# Patient Record
Sex: Female | Born: 1950 | Race: White | Hispanic: No | Marital: Single
Health system: Midwestern US, Academic
[De-identification: ages and names within clinical notes are randomized; demographics above are authoritative.]

---

## 2017-04-14 MED ORDER — IOPAMIDOL 76 % IV SOLN
100 mL | Freq: Once | INTRAVENOUS | 0 refills | Status: CP
Start: 2017-04-14 — End: ?

## 2017-04-14 MED ORDER — SODIUM CHLORIDE 0.9 % IJ SOLN
50 mL | Freq: Once | INTRAVENOUS | 0 refills | Status: CP
Start: 2017-04-14 — End: ?

## 2018-03-05 ENCOUNTER — Encounter: Admit: 2018-03-05 | Discharge: 2018-03-05 | Payer: MEDICARE

## 2018-03-05 DIAGNOSIS — K219 Gastro-esophageal reflux disease without esophagitis: ICD-10-CM

## 2018-03-05 DIAGNOSIS — K289 Gastrojejunal ulcer, unspecified as acute or chronic, without hemorrhage or perforation: ICD-10-CM

## 2018-03-05 DIAGNOSIS — E039 Hypothyroidism, unspecified: ICD-10-CM

## 2018-03-05 DIAGNOSIS — J309 Allergic rhinitis, unspecified: Principal | ICD-10-CM

## 2018-03-05 DIAGNOSIS — I10 Essential (primary) hypertension: ICD-10-CM

## 2018-03-05 DIAGNOSIS — J45909 Unspecified asthma, uncomplicated: ICD-10-CM

## 2018-03-05 DIAGNOSIS — E785 Hyperlipidemia, unspecified: ICD-10-CM

## 2018-03-05 DIAGNOSIS — H269 Unspecified cataract: ICD-10-CM

## 2018-03-05 DIAGNOSIS — N186 End stage renal disease: ICD-10-CM

## 2018-03-05 DIAGNOSIS — J449 Chronic obstructive pulmonary disease, unspecified: ICD-10-CM

## 2018-03-05 DIAGNOSIS — R7303 Prediabetes: ICD-10-CM

## 2018-04-13 ENCOUNTER — Encounter: Admit: 2018-04-13 | Discharge: 2018-04-13 | Payer: MEDICARE

## 2018-04-13 ENCOUNTER — Ambulatory Visit: Admit: 2018-04-13 | Discharge: 2018-04-14 | Payer: MEDICARE

## 2018-04-13 ENCOUNTER — Ambulatory Visit: Admit: 2018-04-13 | Discharge: 2018-04-13 | Payer: MEDICARE

## 2018-04-13 DIAGNOSIS — M797 Fibromyalgia: ICD-10-CM

## 2018-04-13 DIAGNOSIS — C641 Malignant neoplasm of right kidney, except renal pelvis: Principal | ICD-10-CM

## 2018-04-13 DIAGNOSIS — J45909 Unspecified asthma, uncomplicated: ICD-10-CM

## 2018-04-13 DIAGNOSIS — K289 Gastrojejunal ulcer, unspecified as acute or chronic, without hemorrhage or perforation: ICD-10-CM

## 2018-04-13 DIAGNOSIS — J449 Chronic obstructive pulmonary disease, unspecified: ICD-10-CM

## 2018-04-13 DIAGNOSIS — I10 Essential (primary) hypertension: ICD-10-CM

## 2018-04-13 DIAGNOSIS — R350 Frequency of micturition: ICD-10-CM

## 2018-04-13 DIAGNOSIS — R7303 Prediabetes: ICD-10-CM

## 2018-04-13 DIAGNOSIS — E039 Hypothyroidism, unspecified: ICD-10-CM

## 2018-04-13 DIAGNOSIS — H269 Unspecified cataract: ICD-10-CM

## 2018-04-13 DIAGNOSIS — N186 End stage renal disease: ICD-10-CM

## 2018-04-13 DIAGNOSIS — E785 Hyperlipidemia, unspecified: ICD-10-CM

## 2018-04-13 DIAGNOSIS — J309 Allergic rhinitis, unspecified: Principal | ICD-10-CM

## 2018-04-13 DIAGNOSIS — K219 Gastro-esophageal reflux disease without esophagitis: ICD-10-CM

## 2018-04-13 LAB — BASIC METABOLIC PANEL
Lab: 108 MMOL/L (ref 98–110)
Lab: 142 MMOL/L (ref 137–147)
Lab: 3.7 MMOL/L (ref 3.5–5.1)

## 2018-04-13 LAB — POC CREATININE, RAD: Lab: 0.8 mg/dL (ref 0.4–1.00)

## 2018-04-13 MED ORDER — SODIUM CHLORIDE 0.9 % IJ SOLN
50 mL | Freq: Once | INTRAVENOUS | 0 refills | Status: CP
Start: 2018-04-13 — End: ?
  Administered 2018-04-13: 15:00:00 50 mL via INTRAVENOUS

## 2018-04-13 MED ORDER — IOHEXOL 350 MG IODINE/ML IV SOLN
100 mL | Freq: Once | INTRAVENOUS | 0 refills | Status: CP
Start: 2018-04-13 — End: ?
  Administered 2018-04-13: 15:00:00 100 mL via INTRAVENOUS

## 2018-04-13 MED ORDER — OXYBUTYNIN CHLORIDE 10 MG PO TR24
10 mg | ORAL_TABLET | Freq: Every day | ORAL | 3 refills | 12.00000 days | Status: AC
Start: 2018-04-13 — End: 2020-04-25

## 2018-04-15 ENCOUNTER — Encounter: Admit: 2018-04-15 | Discharge: 2018-04-15 | Payer: MEDICARE

## 2018-04-17 ENCOUNTER — Encounter: Admit: 2018-04-17 | Discharge: 2018-04-17 | Payer: MEDICARE

## 2018-04-17 DIAGNOSIS — K869 Disease of pancreas, unspecified: Principal | ICD-10-CM

## 2018-04-17 MED ORDER — SODIUM CHLORIDE 0.9 % IV SOLP
INTRAVENOUS | 0 refills | Status: CN
Start: 2018-04-17 — End: ?

## 2018-04-20 ENCOUNTER — Ambulatory Visit: Admit: 2018-04-20 | Discharge: 2018-04-20 | Payer: MEDICARE

## 2018-04-20 DIAGNOSIS — K869 Disease of pancreas, unspecified: Principal | ICD-10-CM

## 2018-05-28 DIAGNOSIS — K8689 Other specified diseases of pancreas: Principal | ICD-10-CM

## 2018-06-01 ENCOUNTER — Encounter: Admit: 2018-06-01 | Discharge: 2018-06-01 | Payer: MEDICARE

## 2018-09-04 ENCOUNTER — Encounter: Admit: 2018-09-04 | Discharge: 2018-09-05 | Payer: MEDICARE

## 2018-09-17 ENCOUNTER — Encounter: Admit: 2018-09-17 | Discharge: 2018-09-17 | Payer: MEDICARE

## 2018-09-18 ENCOUNTER — Encounter: Admit: 2018-09-18 | Discharge: 2018-09-18 | Payer: MEDICARE

## 2018-09-25 ENCOUNTER — Encounter: Admit: 2018-09-25 | Discharge: 2018-09-25 | Payer: MEDICARE

## 2018-09-25 ENCOUNTER — Ambulatory Visit: Admit: 2018-09-25 | Discharge: 2018-09-25 | Payer: MEDICARE

## 2018-09-25 DIAGNOSIS — E039 Hypothyroidism, unspecified: ICD-10-CM

## 2018-09-25 DIAGNOSIS — Z888 Allergy status to other drugs, medicaments and biological substances status: ICD-10-CM

## 2018-09-25 DIAGNOSIS — Z905 Acquired absence of kidney: ICD-10-CM

## 2018-09-25 DIAGNOSIS — N186 End stage renal disease: ICD-10-CM

## 2018-09-25 DIAGNOSIS — Z882 Allergy status to sulfonamides status: ICD-10-CM

## 2018-09-25 DIAGNOSIS — Z85528 Personal history of other malignant neoplasm of kidney: ICD-10-CM

## 2018-09-25 DIAGNOSIS — H269 Unspecified cataract: ICD-10-CM

## 2018-09-25 DIAGNOSIS — I120 Hypertensive chronic kidney disease with stage 5 chronic kidney disease or end stage renal disease: ICD-10-CM

## 2018-09-25 DIAGNOSIS — M797 Fibromyalgia: ICD-10-CM

## 2018-09-25 DIAGNOSIS — Z87891 Personal history of nicotine dependence: ICD-10-CM

## 2018-09-25 DIAGNOSIS — K219 Gastro-esophageal reflux disease without esophagitis: ICD-10-CM

## 2018-09-25 DIAGNOSIS — Z880 Allergy status to penicillin: ICD-10-CM

## 2018-09-25 DIAGNOSIS — E785 Hyperlipidemia, unspecified: ICD-10-CM

## 2018-09-25 DIAGNOSIS — Z885 Allergy status to narcotic agent status: ICD-10-CM

## 2018-09-25 DIAGNOSIS — R7303 Prediabetes: ICD-10-CM

## 2018-09-25 DIAGNOSIS — J449 Chronic obstructive pulmonary disease, unspecified: ICD-10-CM

## 2018-09-25 DIAGNOSIS — J45909 Unspecified asthma, uncomplicated: ICD-10-CM

## 2018-09-25 DIAGNOSIS — K289 Gastrojejunal ulcer, unspecified as acute or chronic, without hemorrhage or perforation: ICD-10-CM

## 2018-09-25 DIAGNOSIS — J309 Allergic rhinitis, unspecified: Principal | ICD-10-CM

## 2018-09-25 DIAGNOSIS — K8689 Other specified diseases of pancreas: Principal | ICD-10-CM

## 2018-09-25 DIAGNOSIS — I10 Essential (primary) hypertension: ICD-10-CM

## 2018-09-25 MED ORDER — LIDOCAINE (PF) 200 MG/10 ML (2 %) IJ SYRG
0 refills | Status: DC
Start: 2018-09-25 — End: 2018-09-25
  Administered 2018-09-25: 16:00:00 120 mg via INTRAVENOUS

## 2018-09-25 MED ORDER — FENTANYL CITRATE (PF) 50 MCG/ML IJ SOLN
50 ug | INTRAVENOUS | 0 refills | Status: DC | PRN
Start: 2018-09-25 — End: 2018-09-25

## 2018-09-25 MED ORDER — PROPOFOL 10 MG/ML IV EMUL 20 ML (INFUSION)(AM)(OR)
INTRAVENOUS | 0 refills | Status: DC
Start: 2018-09-25 — End: 2018-09-25
  Administered 2018-09-25: 16:00:00 150 ug/kg/min via INTRAVENOUS

## 2018-09-25 MED ORDER — HALOPERIDOL LACTATE 5 MG/ML IJ SOLN
1 mg | Freq: Once | INTRAVENOUS | 0 refills | Status: CP | PRN
Start: 2018-09-25 — End: ?
  Administered 2018-09-25: 17:00:00 1 mg via INTRAVENOUS

## 2018-09-25 MED ORDER — PROPOFOL INJ 10 MG/ML IV VIAL
0 refills | Status: DC
Start: 2018-09-25 — End: 2018-09-25
  Administered 2018-09-25: 16:00:00 30 mg via INTRAVENOUS
  Administered 2018-09-25: 16:00:00 10 mg via INTRAVENOUS
  Administered 2018-09-25 (×2): 20 mg via INTRAVENOUS
  Administered 2018-09-25: 16:00:00 40 mg via INTRAVENOUS

## 2018-09-25 MED ORDER — SODIUM CHLORIDE 0.9 % IV SOLP
1000 mL | INTRAVENOUS | 0 refills | Status: DC
Start: 2018-09-25 — End: 2018-09-25
  Administered 2018-09-25: 15:00:00 1000 mL via INTRAVENOUS

## 2018-09-28 ENCOUNTER — Encounter: Admit: 2018-09-28 | Discharge: 2018-09-28 | Payer: MEDICARE

## 2018-09-28 DIAGNOSIS — I10 Essential (primary) hypertension: ICD-10-CM

## 2018-09-28 DIAGNOSIS — E785 Hyperlipidemia, unspecified: ICD-10-CM

## 2018-09-28 DIAGNOSIS — M797 Fibromyalgia: ICD-10-CM

## 2018-09-28 DIAGNOSIS — J309 Allergic rhinitis, unspecified: Principal | ICD-10-CM

## 2018-09-28 DIAGNOSIS — N186 End stage renal disease: ICD-10-CM

## 2018-09-28 DIAGNOSIS — K219 Gastro-esophageal reflux disease without esophagitis: ICD-10-CM

## 2018-09-28 DIAGNOSIS — R7303 Prediabetes: ICD-10-CM

## 2018-09-28 DIAGNOSIS — H269 Unspecified cataract: ICD-10-CM

## 2018-09-28 DIAGNOSIS — K289 Gastrojejunal ulcer, unspecified as acute or chronic, without hemorrhage or perforation: ICD-10-CM

## 2018-09-28 DIAGNOSIS — J45909 Unspecified asthma, uncomplicated: ICD-10-CM

## 2018-09-28 DIAGNOSIS — J449 Chronic obstructive pulmonary disease, unspecified: ICD-10-CM

## 2018-09-28 DIAGNOSIS — E039 Hypothyroidism, unspecified: ICD-10-CM

## 2018-10-02 ENCOUNTER — Encounter: Admit: 2018-10-02 | Discharge: 2018-10-02 | Payer: MEDICARE

## 2018-10-02 ENCOUNTER — Ambulatory Visit: Admit: 2018-10-02 | Discharge: 2018-10-02 | Payer: MEDICARE

## 2018-10-02 DIAGNOSIS — K869 Disease of pancreas, unspecified: Principal | ICD-10-CM

## 2018-10-02 MED ORDER — SODIUM CHLORIDE 0.9 % IV SOLP
INTRAVENOUS | 0 refills | Status: CN
Start: 2018-10-02 — End: ?

## 2018-10-05 ENCOUNTER — Encounter: Admit: 2018-10-05 | Discharge: 2018-10-05 | Payer: MEDICARE

## 2018-10-06 ENCOUNTER — Encounter: Admit: 2018-10-06 | Discharge: 2018-10-06 | Payer: MEDICARE

## 2018-10-13 ENCOUNTER — Encounter: Admit: 2018-10-13 | Discharge: 2018-10-13 | Payer: MEDICARE

## 2018-10-15 ENCOUNTER — Encounter: Admit: 2018-10-15 | Discharge: 2018-10-16 | Payer: MEDICARE

## 2018-11-07 ENCOUNTER — Encounter: Admit: 2018-11-07 | Discharge: 2018-11-07 | Payer: MEDICARE

## 2018-11-10 ENCOUNTER — Encounter: Admit: 2018-11-10 | Discharge: 2018-11-10 | Payer: MEDICARE

## 2018-11-10 DIAGNOSIS — K869 Disease of pancreas, unspecified: Principal | ICD-10-CM

## 2019-05-17 ENCOUNTER — Encounter: Admit: 2019-05-17 | Discharge: 2019-05-17

## 2019-07-05 IMAGING — US VDOPLERT
1 series · 14 of 16 positions shown · non-contrast
Comparison: none

Procedure(s): US venous dop LE [id number]

RIGHT LOWER EXTREMITY VENOUS DOPPLER DATED 07/05/2019:
INDICATION: Lower extremity pain and swelling.

[Series 1: us venous dop le (id) · portal-venous · 14 of 19 slices shown]
[im 1/19]
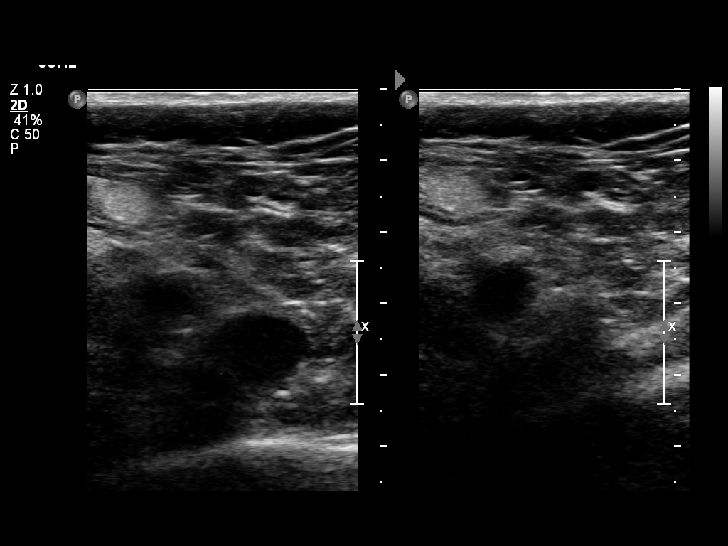
[im 2/19]
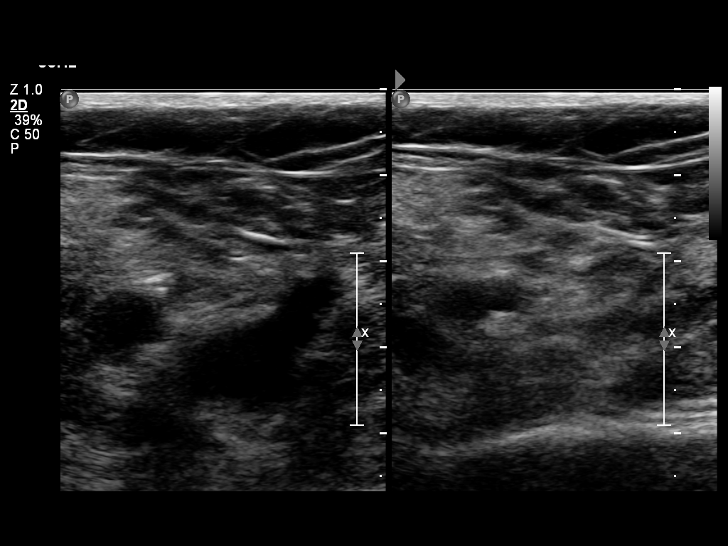
[im 3/19]
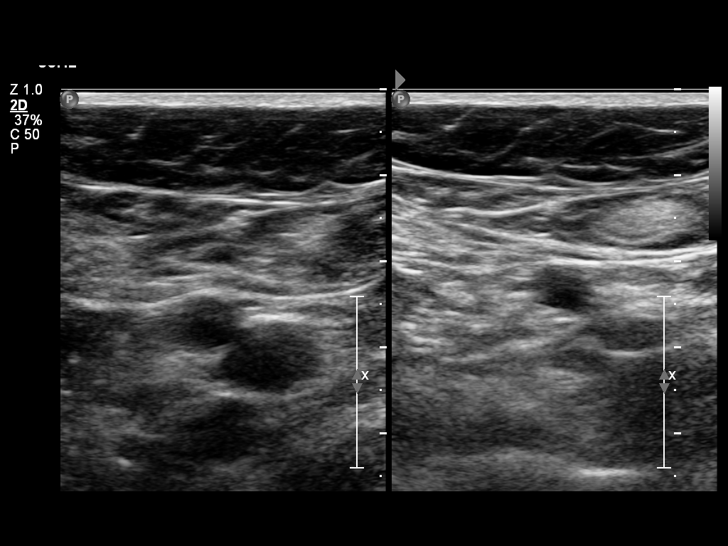
[im 5/19]
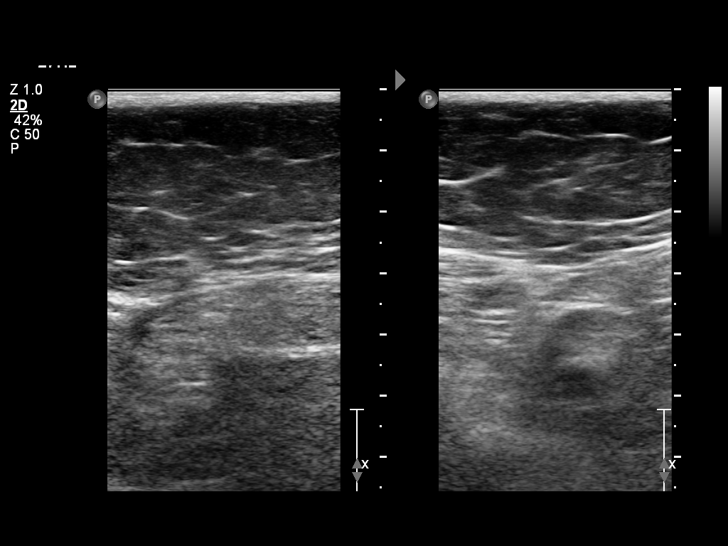
[im 7/19]
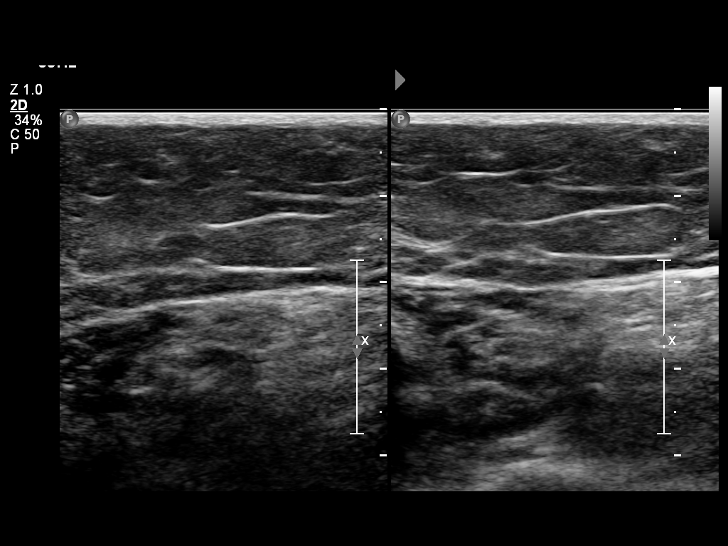
[im 8/19]
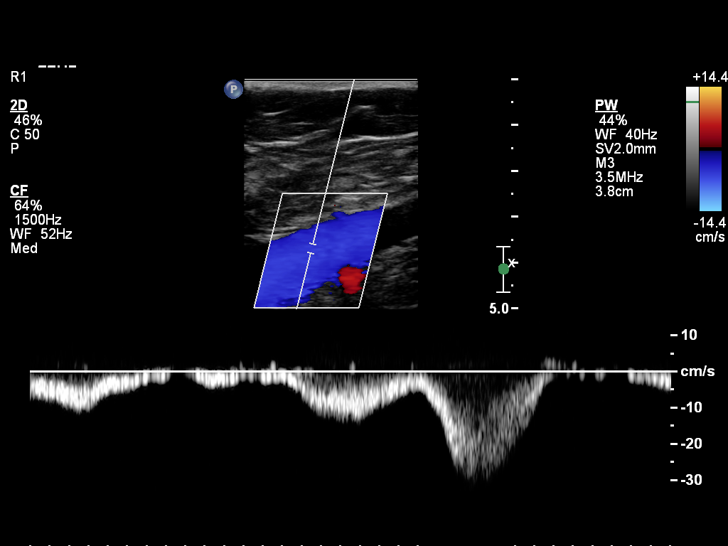
[im 9/19]
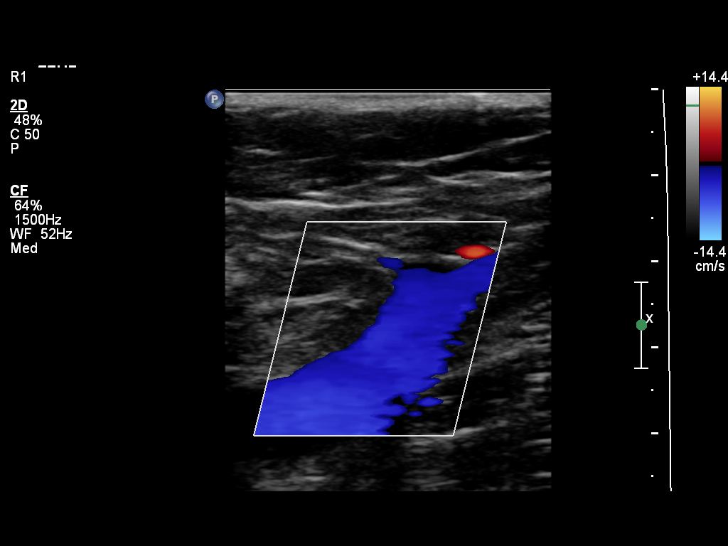
[im 10/19]
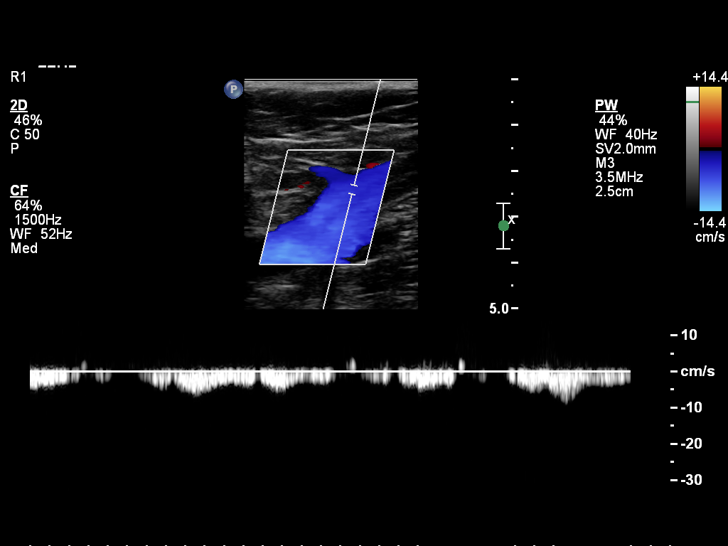
[im 11/19]
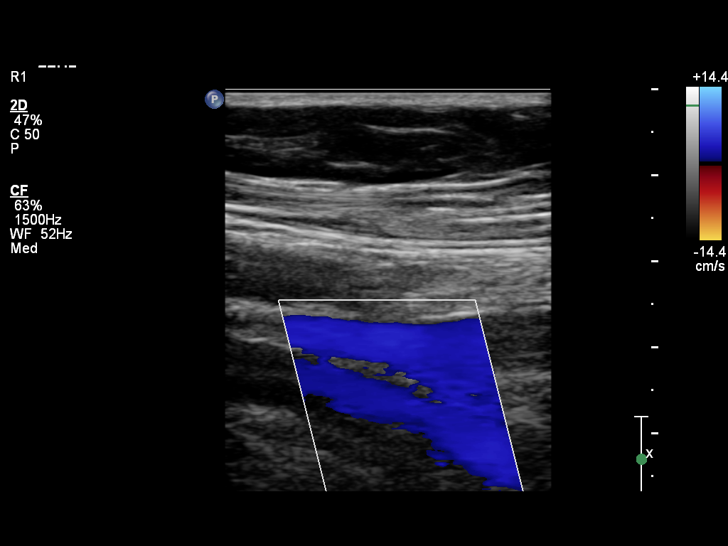
[im 13/19]
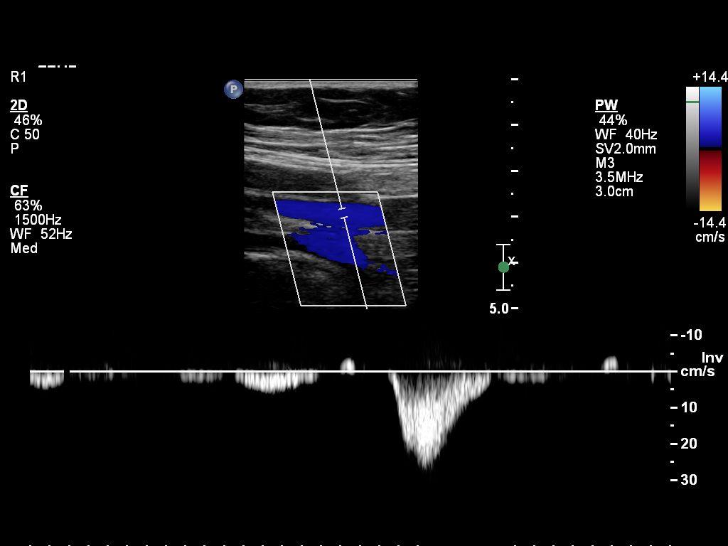
[im 15/19]
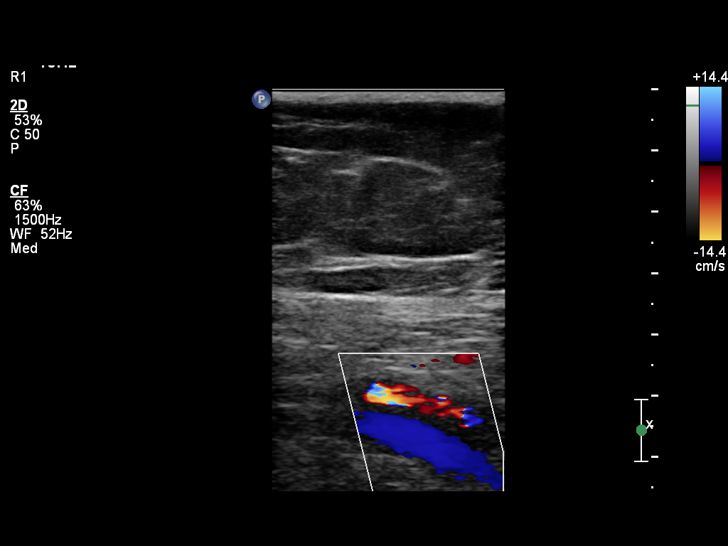
[im 16/19]
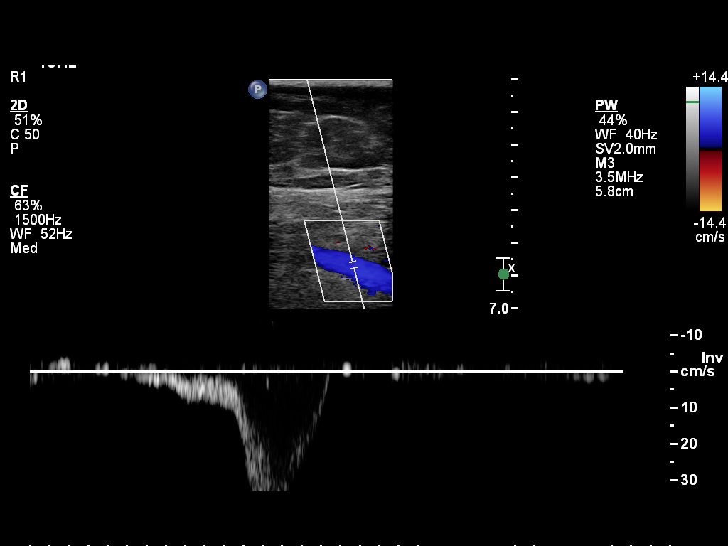
[im 17/19]
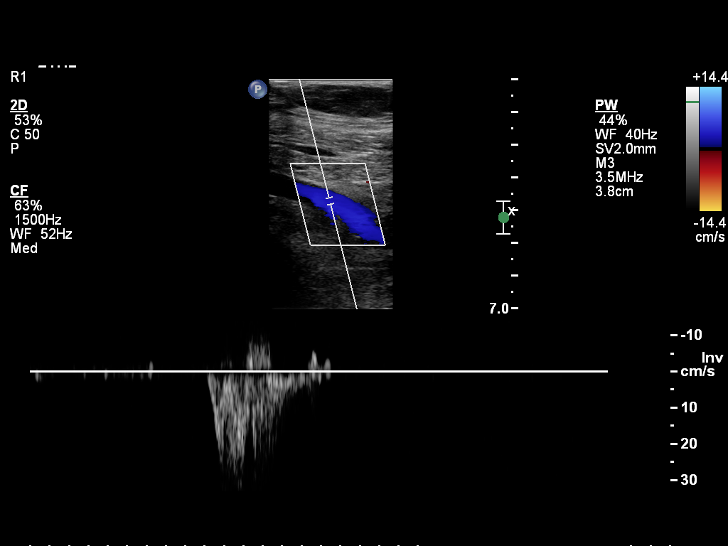
[im 19/19]
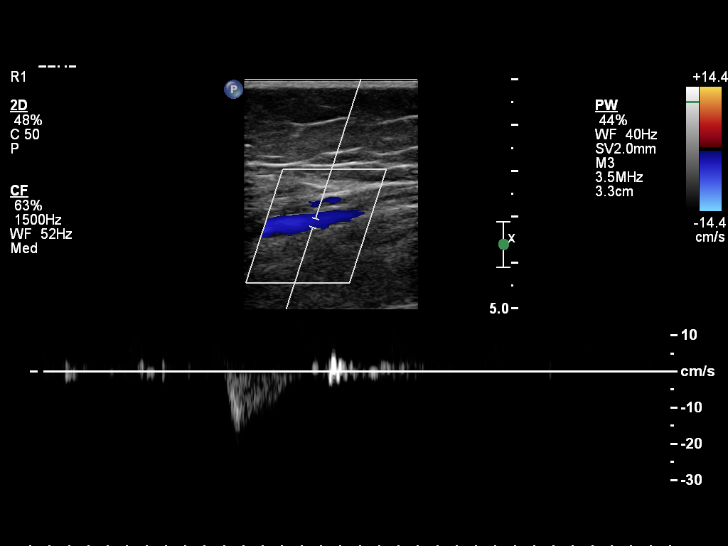

[14 of 16 positions shown; findings below may reference images not displayed]

COMPARISON STUDIES: None

TECHNIQUE/FINDINGS:

Grayscale, pulse wave Doppler and color flow Doppler imaging
was obtained of the venous system of the right lower
extremity. No evidence of deep venous thrombosis is seen
from the level of the common femoral vein down to the calf
veins. The greater saphenous vein is patent.
IMPRESSION: No evidence of lower extremity deep venous thrombosis from
the right common femoral vein to the calf veins.

## 2019-08-23 ENCOUNTER — Encounter: Admit: 2019-08-23 | Discharge: 2019-08-23 | Payer: MEDICARE

## 2019-08-23 ENCOUNTER — Ambulatory Visit: Admit: 2019-08-23 | Discharge: 2019-08-23 | Payer: MEDICARE

## 2019-08-23 ENCOUNTER — Ambulatory Visit: Admit: 2019-08-23 | Discharge: 2019-08-24 | Payer: MEDICARE

## 2019-08-23 LAB — BASIC METABOLIC PANEL
Lab: 0.9 mg/dL (ref 0.4–1.00)
Lab: 101 mg/dL — ABNORMAL HIGH (ref 70–100)
Lab: 105 MMOL/L (ref 98–110)
Lab: 13 mg/dL (ref 7–25)
Lab: 141 MMOL/L (ref 137–147)
Lab: 26 MMOL/L (ref 21–30)
Lab: 3.9 MMOL/L (ref 3.5–5.1)
Lab: 9.4 mg/dL (ref 8.5–10.6)
Lab: >60 mL/min (ref >60)
Lab: >60 mL/min (ref >60)
Lab: 10 (ref 3–12)

## 2019-08-23 MED ORDER — IOHEXOL 350 MG IODINE/ML IV SOLN
100 mL | Freq: Once | INTRAVENOUS | 0 refills | Status: CP
Start: 2019-08-23 — End: ?
  Administered 2019-08-23: 17:00:00 100 mL via INTRAVENOUS

## 2019-08-23 MED ORDER — SODIUM CHLORIDE 0.9 % IJ SOLN
50 mL | Freq: Once | INTRAVENOUS | 0 refills | Status: CP
Start: 2019-08-23 — End: ?
  Administered 2019-08-23: 17:00:00 50 mL via INTRAVENOUS

## 2019-09-24 ENCOUNTER — Encounter: Admit: 2019-09-24 | Discharge: 2019-09-24 | Payer: MEDICARE

## 2019-09-24 NOTE — Telephone Encounter
Pt has appt with Dr. Candi Leash on 11/6. d/t "cardiac conditions" she called to see if she needs to reschedule/cancel. She states that cardiology will not clear patient for EUS. Dr. Druscilla Brownie at [facility] in [place] is managing her cardiac care. Call placed to Dr. Druscilla Brownie office. Left message to discuss with Dr. Druscilla Brownie and they will call me back.

## 2019-10-05 ENCOUNTER — Encounter: Admit: 2019-10-05 | Discharge: 2019-10-05 | Payer: MEDICARE

## 2019-10-05 DIAGNOSIS — Z1159 Encounter for screening for other viral diseases: Secondary | ICD-10-CM

## 2019-10-05 DIAGNOSIS — K869 Disease of pancreas, unspecified: Secondary | ICD-10-CM

## 2019-10-05 NOTE — Telephone Encounter
Received cardiac clearance from Dr. Druscilla Brownie stating "as the endoscopy is similar to the transesophageal echocardiogram she should be able to tolerate the procedure."  Patient notified and prep for case placed.  Recommendation from Dr. Druscilla Brownie placed for scanning

## 2019-10-06 ENCOUNTER — Ambulatory Visit: Admit: 2019-10-06 | Discharge: 2019-10-06 | Payer: MEDICARE

## 2019-10-06 DIAGNOSIS — K869 Disease of pancreas, unspecified: Secondary | ICD-10-CM

## 2019-10-17 ENCOUNTER — Encounter: Admit: 2019-10-17 | Discharge: 2019-10-17 | Payer: MEDICARE

## 2019-10-17 DIAGNOSIS — I10 Essential (primary) hypertension: Secondary | ICD-10-CM

## 2019-10-17 DIAGNOSIS — K289 Gastrojejunal ulcer, unspecified as acute or chronic, without hemorrhage or perforation: Secondary | ICD-10-CM

## 2019-10-17 DIAGNOSIS — E785 Hyperlipidemia, unspecified: Secondary | ICD-10-CM

## 2019-10-17 DIAGNOSIS — M797 Fibromyalgia: Secondary | ICD-10-CM

## 2019-10-17 DIAGNOSIS — R7303 Prediabetes: Secondary | ICD-10-CM

## 2019-10-17 DIAGNOSIS — J309 Allergic rhinitis, unspecified: Secondary | ICD-10-CM

## 2019-10-17 DIAGNOSIS — E039 Hypothyroidism, unspecified: Secondary | ICD-10-CM

## 2019-10-17 DIAGNOSIS — K219 Gastro-esophageal reflux disease without esophagitis: Secondary | ICD-10-CM

## 2019-10-17 DIAGNOSIS — H269 Unspecified cataract: Secondary | ICD-10-CM

## 2019-10-17 DIAGNOSIS — J449 Chronic obstructive pulmonary disease, unspecified: Secondary | ICD-10-CM

## 2019-10-17 DIAGNOSIS — N186 End stage renal disease: Secondary | ICD-10-CM

## 2019-10-17 DIAGNOSIS — J45909 Unspecified asthma, uncomplicated: Secondary | ICD-10-CM

## 2019-10-20 IMAGING — DX XR chest 1V portable
1 series · 1 of 1 positions shown · non-contrast
Comparison: none

Procedure(s): XR chest 1V portable

CHEST X-RAY, SINGLE VIEW [DATE]
CLINICAL DATA: Shortness of breath.

[AP]
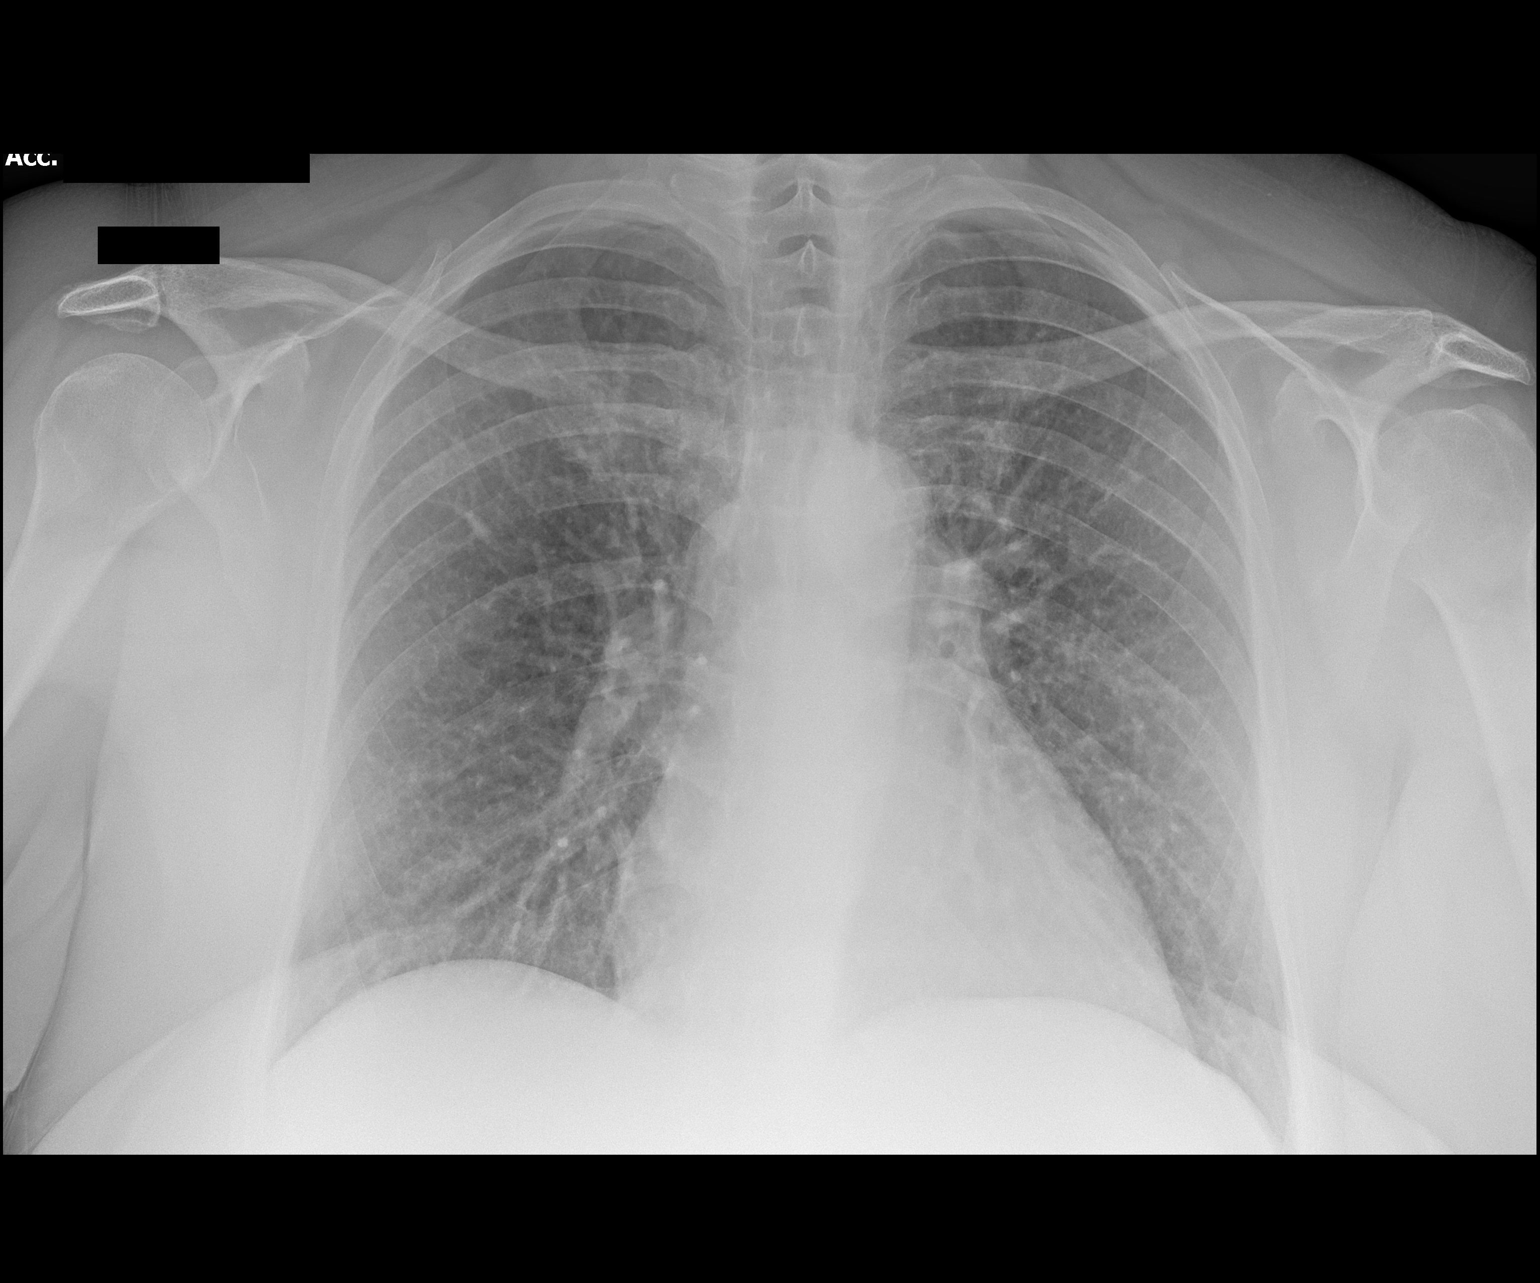

[1 of 1 positions shown; findings below may reference images not displayed]

IMPRESSION: No acute cardiopulmonary process detected. Normal heart
size.

## 2019-10-25 IMAGING — DX XR chest 1V portable
1 series · 1 of 1 positions shown · non-contrast
Comparison: October 20, 2019.

Procedure(s): XR chest 1V portable

CHEST X-RAY, SINGLE VIEW 320
CLINICAL DATA: Shortness of breath.

[AP]
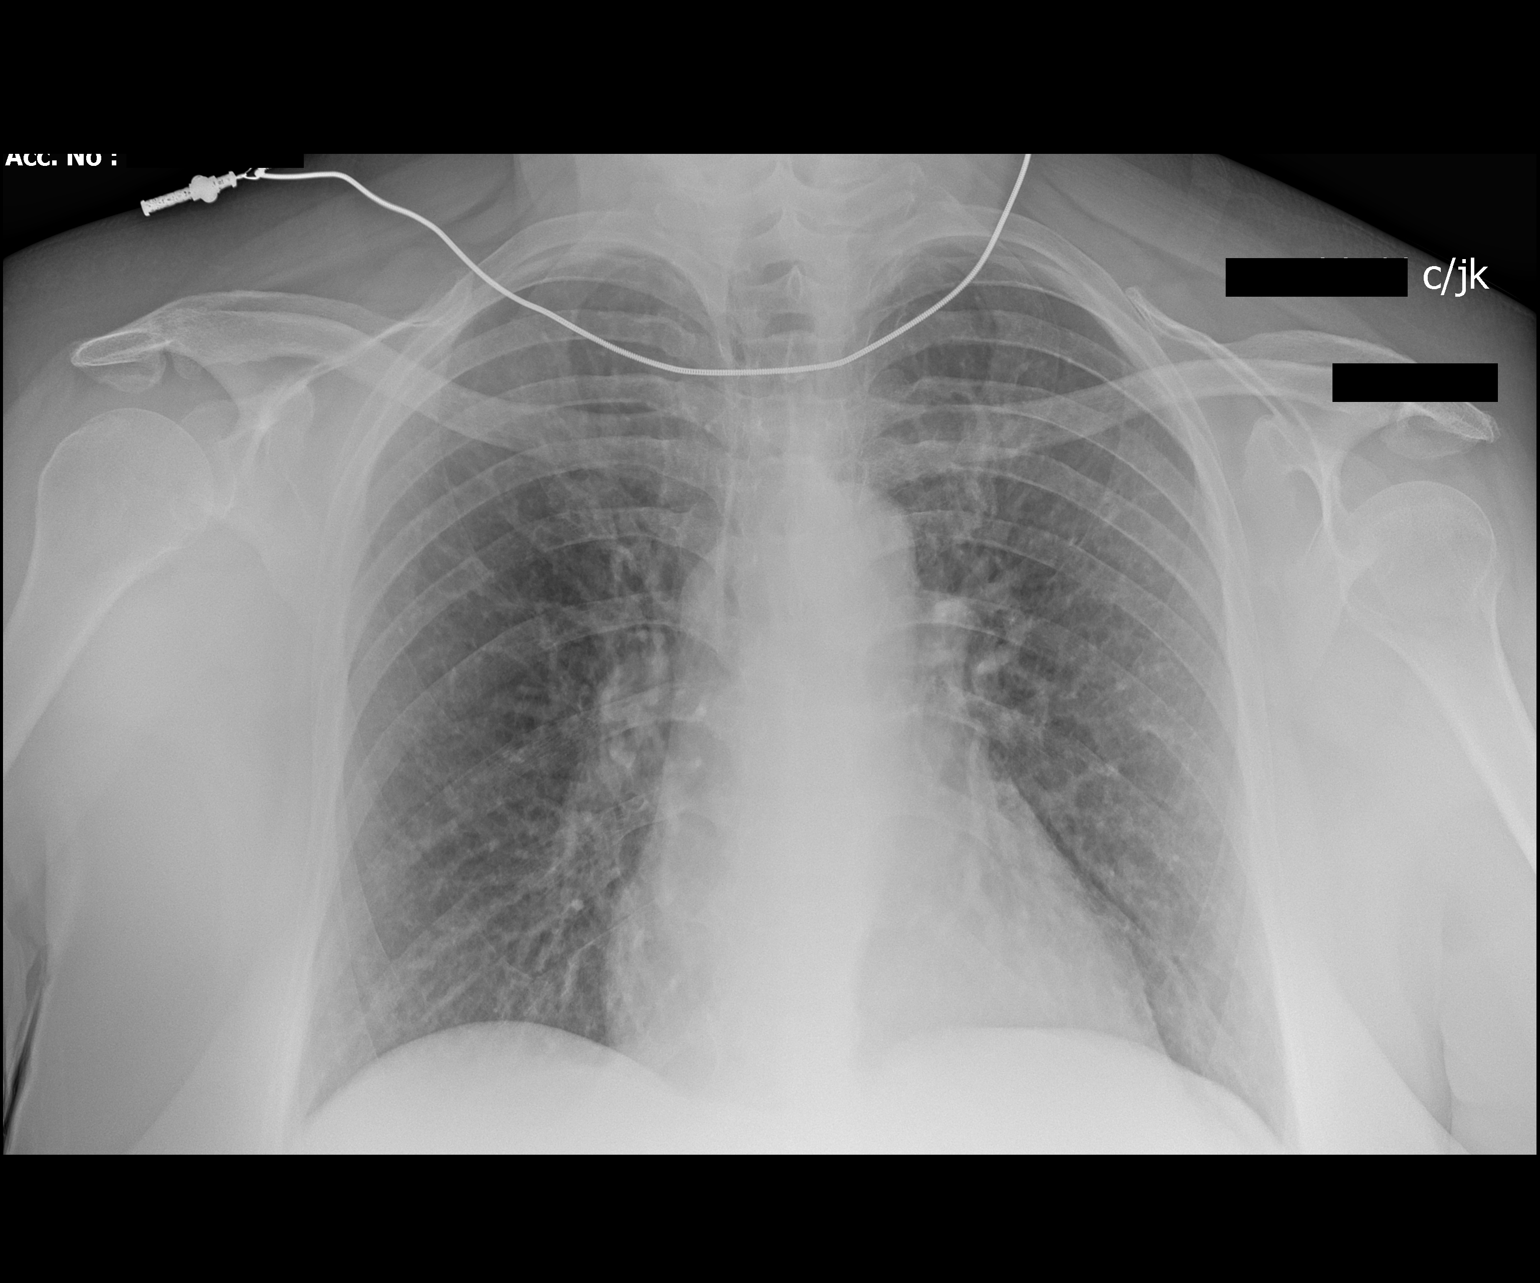

[1 of 1 positions shown; findings below may reference images not displayed]

IMPRESSION: No acute cardiopulmonary process detected. Normal heart
size.

## 2019-12-06 ENCOUNTER — Encounter: Admit: 2019-12-06 | Discharge: 2019-12-06 | Payer: MEDICARE

## 2019-12-06 DIAGNOSIS — Z1159 Encounter for screening for other viral diseases: Secondary | ICD-10-CM

## 2019-12-06 LAB — COVID-19 (SARS-COV-2) PCR

## 2019-12-07 ENCOUNTER — Encounter: Admit: 2019-12-07 | Discharge: 2019-12-07 | Payer: MEDICARE

## 2019-12-07 ENCOUNTER — Ambulatory Visit: Admit: 2019-12-07 | Discharge: 2019-12-07 | Payer: MEDICARE

## 2019-12-07 DIAGNOSIS — I10 Essential (primary) hypertension: Secondary | ICD-10-CM

## 2019-12-07 DIAGNOSIS — K289 Gastrojejunal ulcer, unspecified as acute or chronic, without hemorrhage or perforation: Secondary | ICD-10-CM

## 2019-12-07 DIAGNOSIS — K219 Gastro-esophageal reflux disease without esophagitis: Secondary | ICD-10-CM

## 2019-12-07 DIAGNOSIS — J45909 Unspecified asthma, uncomplicated: Secondary | ICD-10-CM

## 2019-12-07 DIAGNOSIS — E785 Hyperlipidemia, unspecified: Secondary | ICD-10-CM

## 2019-12-07 DIAGNOSIS — J309 Allergic rhinitis, unspecified: Secondary | ICD-10-CM

## 2019-12-07 DIAGNOSIS — R7303 Prediabetes: Secondary | ICD-10-CM

## 2019-12-07 DIAGNOSIS — N186 End stage renal disease: Secondary | ICD-10-CM

## 2019-12-07 DIAGNOSIS — J449 Chronic obstructive pulmonary disease, unspecified: Secondary | ICD-10-CM

## 2019-12-07 DIAGNOSIS — H269 Unspecified cataract: Secondary | ICD-10-CM

## 2019-12-07 DIAGNOSIS — E039 Hypothyroidism, unspecified: Secondary | ICD-10-CM

## 2019-12-07 DIAGNOSIS — M797 Fibromyalgia: Secondary | ICD-10-CM

## 2019-12-07 MED ORDER — PROPOFOL 10 MG/ML IV EMUL 20 ML (INFUSION)(AM)(OR)
INTRAVENOUS | 0 refills | Status: DC
Start: 2019-12-07 — End: 2019-12-07
  Administered 2019-12-07: 20:00:00 20.000 mL via INTRAVENOUS

## 2019-12-07 MED ORDER — LIDOCAINE (PF) 200 MG/10 ML (2 %) IJ SYRG
0 refills | Status: DC
Start: 2019-12-07 — End: 2019-12-07
  Administered 2019-12-07: 19:00:00 100 mg via INTRAVENOUS

## 2019-12-07 MED ORDER — ONDANSETRON HCL (PF) 4 MG/2 ML IJ SOLN
4 mg | Freq: Once | INTRAVENOUS | 0 refills | Status: DC | PRN
Start: 2019-12-07 — End: 2019-12-07

## 2019-12-07 MED ORDER — PROPOFOL INJ 10 MG/ML IV VIAL
0 refills | Status: DC
Start: 2019-12-07 — End: 2019-12-07
  Administered 2019-12-07 (×2): 30 mg via INTRAVENOUS
  Administered 2019-12-07: 19:00:00 20 mg via INTRAVENOUS
  Administered 2019-12-07 (×2): 30 mg via INTRAVENOUS
  Administered 2019-12-07: 19:00:00 50 mg via INTRAVENOUS

## 2019-12-07 MED ORDER — FENTANYL CITRATE (PF) 50 MCG/ML IJ SOLN
50 ug | INTRAVENOUS | 0 refills | Status: DC | PRN
Start: 2019-12-07 — End: 2019-12-07
  Administered 2019-12-07: 20:00:00 50 ug via INTRAVENOUS

## 2019-12-07 MED ORDER — FENTANYL CITRATE (PF) 50 MCG/ML IJ SOLN
25 ug | INTRAVENOUS | 0 refills | Status: DC | PRN
Start: 2019-12-07 — End: 2019-12-07
  Administered 2019-12-07: 20:00:00 25 ug via INTRAVENOUS

## 2019-12-07 MED ORDER — LACTATED RINGERS IV SOLP
0 refills | Status: DC
Start: 2019-12-07 — End: 2019-12-07
  Administered 2019-12-07: 19:00:00 via INTRAVENOUS

## 2019-12-07 MED ORDER — HALOPERIDOL LACTATE 5 MG/ML IJ SOLN
1 mg | Freq: Once | INTRAVENOUS | 0 refills | Status: DC | PRN
Start: 2019-12-07 — End: 2019-12-07

## 2019-12-07 NOTE — Telephone Encounter
Attempted to call patient to discuss negative COVID-19 test result; no answer, left VM directing patient to MyChart, as patient is active on MyChart. RN sent MyChart message notifying patient of result.

## 2019-12-07 NOTE — Anesthesia Post-Procedure Evaluation
Post-Anesthesia Evaluation    Name: Shelby Rogers      MRN: [id number]     DOB: [birth year]     Age: 69 y.o.     Sex: female   __________________________________________________________________________     Procedure Information     Anesthesia Start Date/Time: 12/07/19 1310    Procedure: ESOPHAGOGASTRODUODENOSCOPY WITH ENDOSCOPIC ULTRASOUND EXAMINATION - FLEXIBLE (N/A )    Location: ENDO 2 / ENDO/GI    Surgeon: Threasa Alpha, MD          Post-Anesthesia Vitals  BP: 136/90 (01/05 1450)  Pulse: 71 (01/05 1450)  Respirations: 13 PER MINUTE (01/05 1450)  SpO2: 94 % (01/05 1450)  SpO2 Pulse: 73 (01/05 1450)   Vitals Value Taken Time   BP 136/90 12/07/19 1450   Temp 37.2 C (99 F) 12/07/19 1350   Pulse 71 12/07/19 1450   Respirations 13 PER MINUTE 12/07/19 1450   SpO2 94 % 12/07/19 1450         Post Anesthesia Evaluation Note    Evaluation location: pre/post  Patient participation: recovered; patient participated in evaluation  Level of consciousness: alert    Pain score: 0  Pain management: adequate    Hydration: normovolemia  Temperature: 36.0C - 38.4C  Airway patency: adequate    Perioperative Events       Post-op nausea and vomiting: nausea; resolved    Postoperative Status  Cardiovascular status: hemodynamically stable  Respiratory status: spontaneous ventilation        Perioperative Events  Perioperative Event: No  Emergency Case Activation: No

## 2019-12-08 ENCOUNTER — Encounter

## 2019-12-08 DIAGNOSIS — M797 Fibromyalgia: Secondary | ICD-10-CM

## 2019-12-08 DIAGNOSIS — H269 Unspecified cataract: Secondary | ICD-10-CM

## 2019-12-08 DIAGNOSIS — N186 End stage renal disease: Secondary | ICD-10-CM

## 2019-12-08 DIAGNOSIS — J449 Chronic obstructive pulmonary disease, unspecified: Secondary | ICD-10-CM

## 2019-12-08 DIAGNOSIS — E785 Hyperlipidemia, unspecified: Secondary | ICD-10-CM

## 2019-12-08 DIAGNOSIS — J45909 Unspecified asthma, uncomplicated: Secondary | ICD-10-CM

## 2019-12-08 DIAGNOSIS — I10 Essential (primary) hypertension: Secondary | ICD-10-CM

## 2019-12-08 DIAGNOSIS — E039 Hypothyroidism, unspecified: Secondary | ICD-10-CM

## 2019-12-08 DIAGNOSIS — K219 Gastro-esophageal reflux disease without esophagitis: Secondary | ICD-10-CM

## 2019-12-08 DIAGNOSIS — R7303 Prediabetes: Secondary | ICD-10-CM

## 2019-12-08 DIAGNOSIS — J309 Allergic rhinitis, unspecified: Secondary | ICD-10-CM

## 2019-12-08 DIAGNOSIS — K289 Gastrojejunal ulcer, unspecified as acute or chronic, without hemorrhage or perforation: Secondary | ICD-10-CM

## 2019-12-08 NOTE — Telephone Encounter
Attempted to call patient regarding procedure questions. No answer. VM left

## 2019-12-08 NOTE — Telephone Encounter
Attempted to call patient to discuss post procedure questions. No answer

## 2019-12-09 ENCOUNTER — Encounter: Admit: 2019-12-09 | Discharge: 2019-12-09 | Payer: MEDICARE

## 2019-12-09 NOTE — Telephone Encounter
Attempted to call patient for post procedure questions. No answer. VM left

## 2019-12-10 ENCOUNTER — Encounter: Admit: 2019-12-10 | Discharge: 2019-12-10 | Payer: MEDICARE

## 2019-12-10 DIAGNOSIS — D3A8 Other benign neuroendocrine tumors: Secondary | ICD-10-CM

## 2019-12-10 NOTE — Telephone Encounter
Discussed below with patient. Referrals placed for Dr. Stevie Kern and Dr. Arma Heading. Procedure and pathology reports sent to Dr. Reche Dixon per patient request.

## 2019-12-10 NOTE — Telephone Encounter
I just s/w the pt and she is not interested in seeing surgery or oncology at this point. She still needs to schedule an ablation with her cardiologist and one other procedure. "she has a lot going on right now".   I tried to explain this could be benign or malignant and worth seeing surgery and oncology. She said she will call back for f/u. Will notify providers.

## 2019-12-10 NOTE — Telephone Encounter
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 Candi Leash,  Patient had EUS with FNA on Monday. She had severe pain 8/10 in recovery where she was given pain meds. She states it is about a 6/10 now, but it is a constant cramping pain in her lower abdomen. Can we give her something for cramping? She does have some nausea and she had a low grade fever of around 99.9-100.5. Denies blood in stools and black stools. No vomiting.   She is following up with PCP tomorrow  Thoughts?  Thank you,  Merleen Nicely

## 2020-01-11 ENCOUNTER — Encounter: Admit: 2020-01-11 | Discharge: 2020-01-11 | Payer: MEDICARE

## 2020-01-18 ENCOUNTER — Encounter: Admit: 2020-01-18 | Discharge: 2020-01-18 | Payer: MEDICARE

## 2020-01-18 DIAGNOSIS — D3A8 Other benign neuroendocrine tumors: Secondary | ICD-10-CM

## 2020-01-21 ENCOUNTER — Encounter: Admit: 2020-01-21 | Discharge: 2020-01-21 | Payer: MEDICARE

## 2020-01-21 DIAGNOSIS — D3A8 Other benign neuroendocrine tumors: Secondary | ICD-10-CM

## 2020-01-24 ENCOUNTER — Encounter: Admit: 2020-01-24 | Discharge: 2020-01-24 | Payer: MEDICARE

## 2020-01-24 DIAGNOSIS — K8689 Other specified diseases of pancreas: Secondary | ICD-10-CM

## 2020-01-24 NOTE — Telephone Encounter
Spoke with patient.  Order for CT was sent to local hospital but she has not heard back from them to schedule.  Patient requesting surgeon and med onc appt same day.  Explained we don't have med onc doctors available on Thursdays.  Patient reports she is 3 hours from here.  When she has appts, she drives to her daughter's [facility] in [place] and then her daughter drives her the rest of the way in.  Offered appts on a Thursday/Friday.  Patient is not sure how much dtr can take off work.  Talked through the option of doing TH.  Patient has not done this and is very nervous about this option.  Suggested we start with ensuring CT is scheduled and then the appt with Dr. Stevie Kern.  We will have to know whether surgery is an option before seeing Medical oncology.  Patient agrees to this.  Patient is going to call local hospital to see about getting CT scan scheduled.  She has my direct phone # and will call me back.

## 2020-01-25 IMAGING — CT Specials^TRIPHASE_PANCREAS (Adult)
2 of 6 series · 10 of 36 positions shown, 17 images · IV contrast (agent unspecified)
Comparison: CT abdomen and pelvis 09/04/2018

Procedure(s): CT abdomen pelvis wo/w con

CT ABDOMEN AND PELVIS without and with IV CONTRAST
INDICATION: 68-year-old female with pancreatic mass
PROCEDURE:
Routine CT scan of the abdomen and pelvis was performed
before and after the intravenous ministration of 100 cc
Optiray 320. Automated exposure control (AEC) dose reduction
technique was used.

[Series 8: abdpel_w · axial · 0.91mm/px · z∈[-393,-24]mm · 9 of 155 slices shown, 15 images]
[im 16/155  soft-tissue]
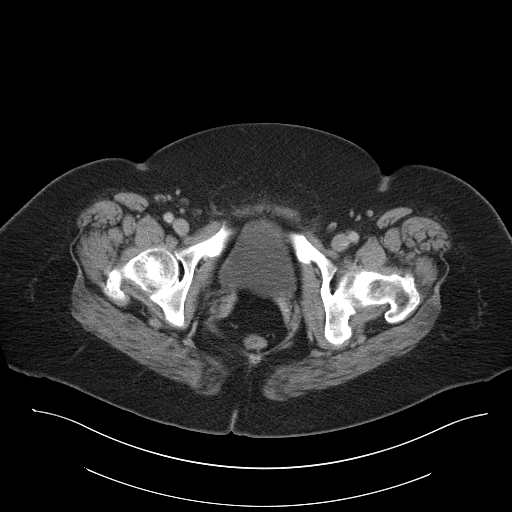
[im 16/155  bone]
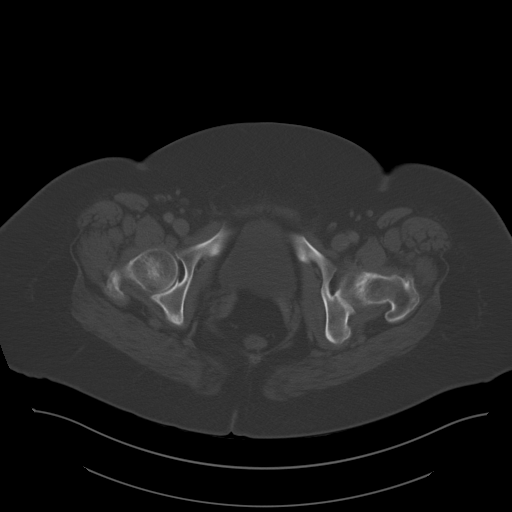
[im 31/155  soft-tissue]
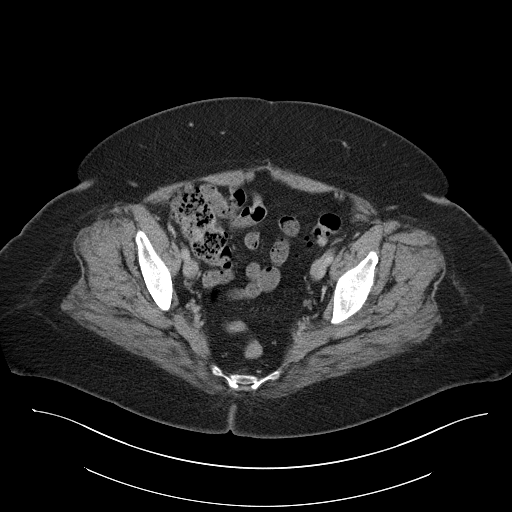
[im 47/155  soft-tissue]
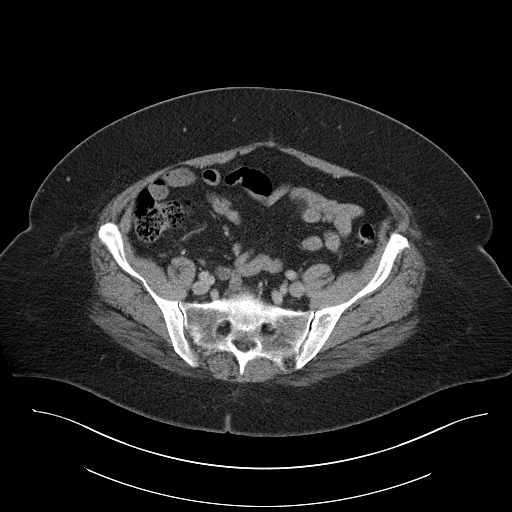
[im 62/155  soft-tissue]
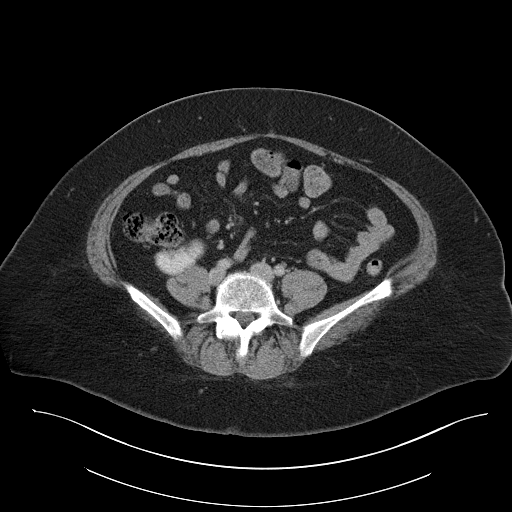
[im 78/155  soft-tissue]
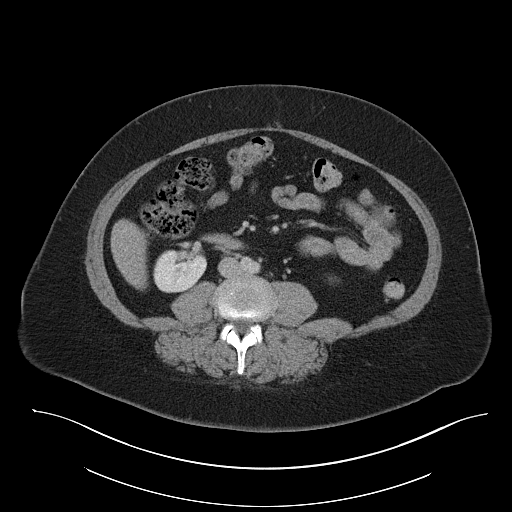
[im 93/155  soft-tissue]
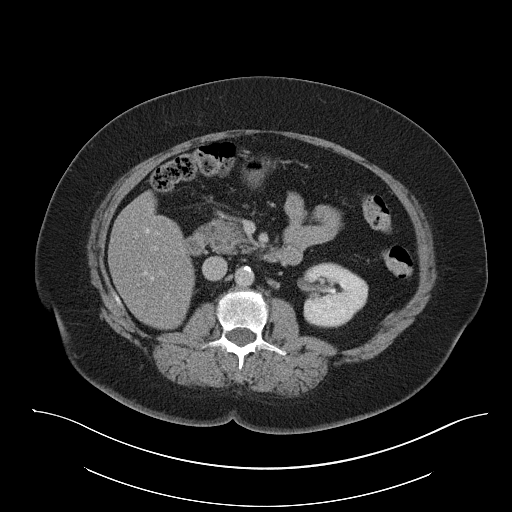
[im 93/155  lung]
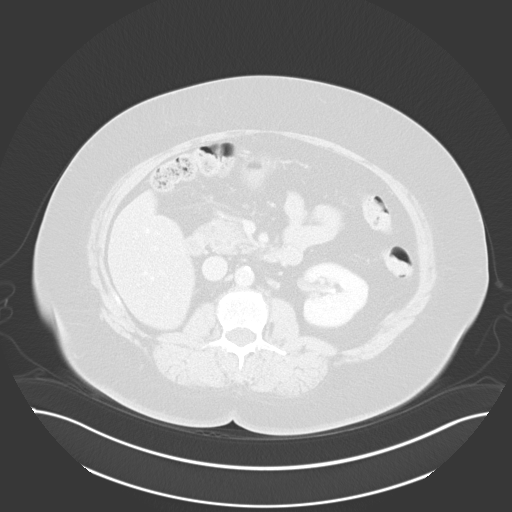
[im 108/155  soft-tissue]
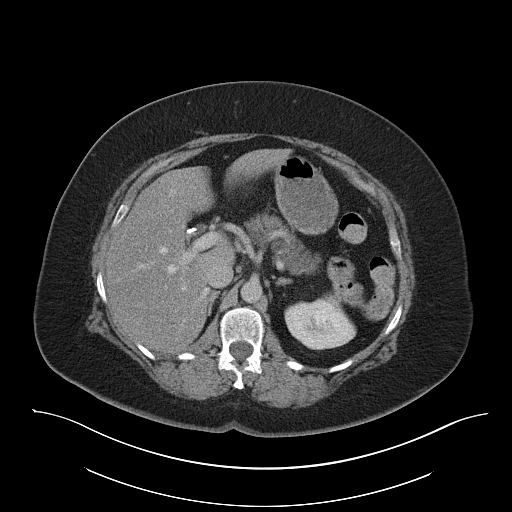
[im 108/155  lung]
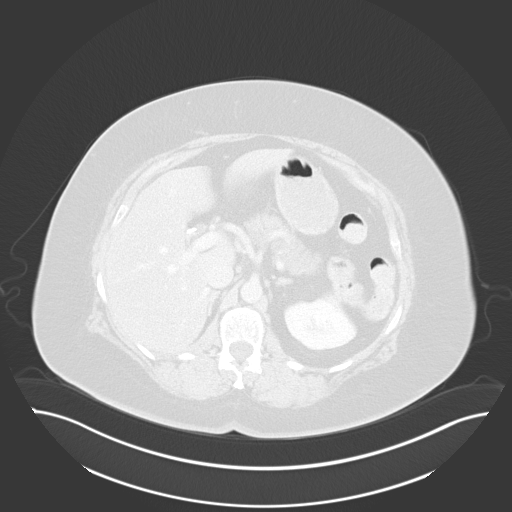
[im 124/155  soft-tissue]
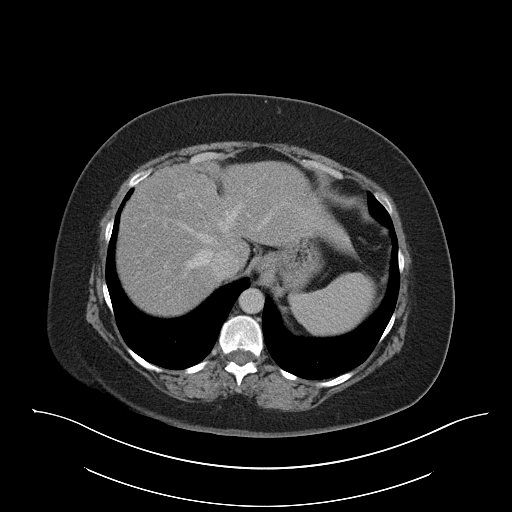
[im 124/155  lung]
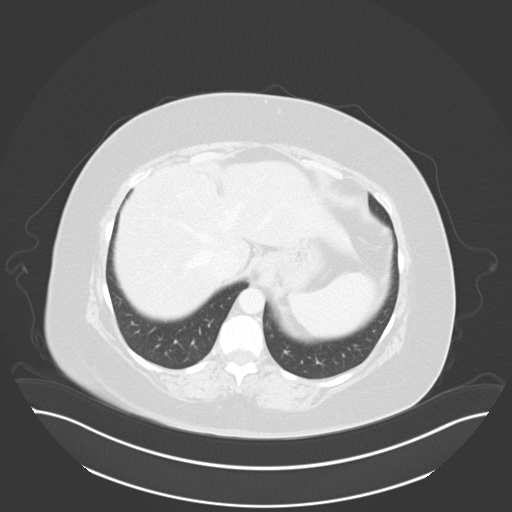
[im 139/155  soft-tissue]
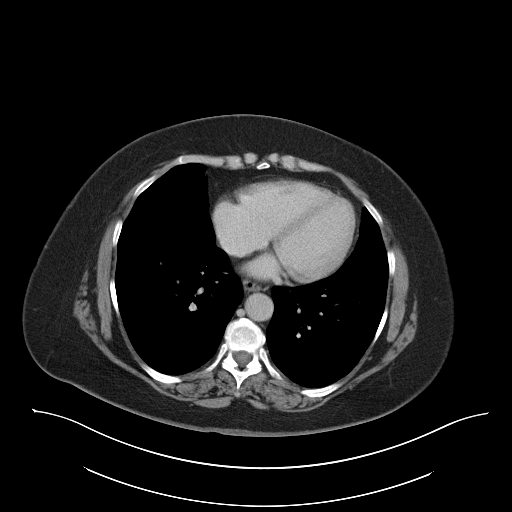
[im 139/155  lung]
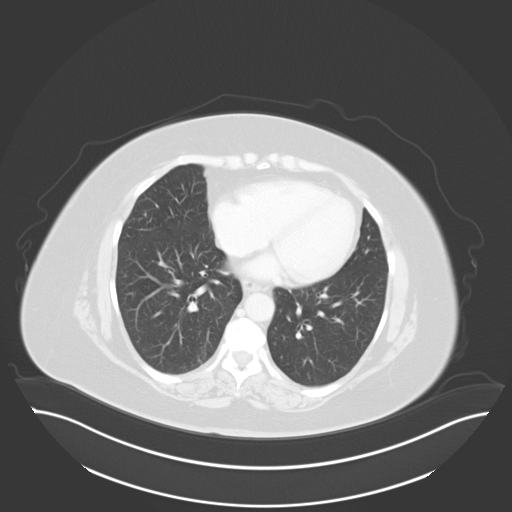
[im 139/155  bone]
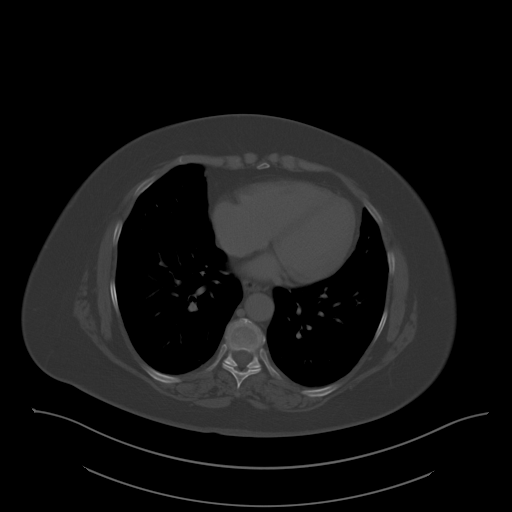

[Series 10: abdpel_w sag sag · sagittal · 0.63mm/px · 1 of 122 slices shown, 2 images]
[im 41/122  soft-tissue]
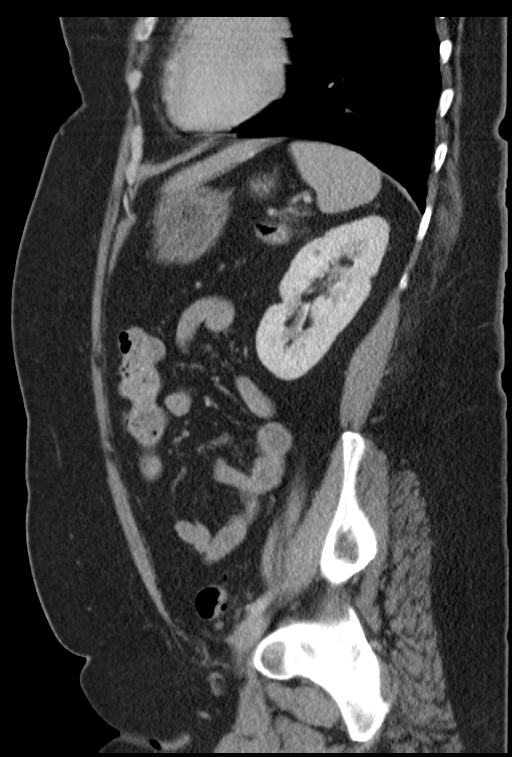
[im 41/122  bone]
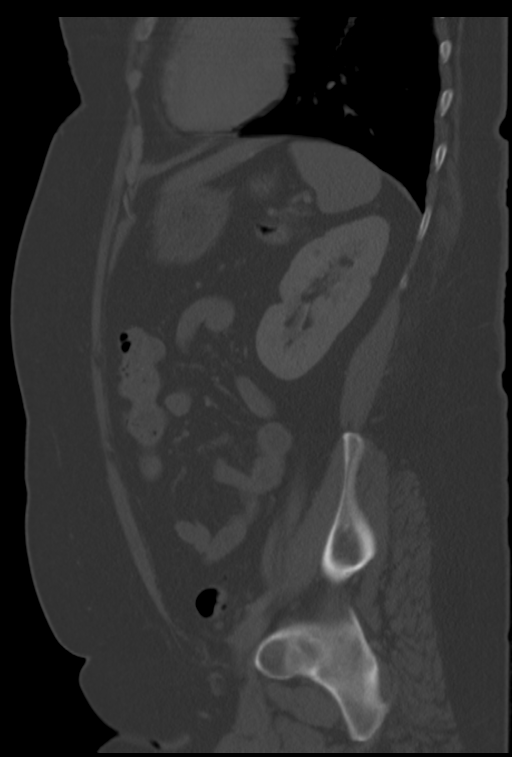

[10 of 36 positions shown; findings below may reference images not displayed]

CT ABDOMEN:
The lung bases are clear.  No pleural or pericardial
effusion is present. The liver and spleen are normal in size
and contour.  The liver is diffusely fatty infiltrated.  No
focal hepatic masses are identified.  The gallbladder is
surgically absent.  The adrenal glands and kidneys are
normal.  The pancreas is normal in size and appearance.  No
peripancreatic fluid or inflammation is present.  The
pancreas enhances uniformly.  No discrete pancreatic masses
are identified.  No abdominal adenopathy or free fluid is
seen.  No thickened or dilated bowel loops are identified.

CT PELVIS:
The ureters and urinary bladder are grossly normal.  The
uterus is surgically absent.  No pelvic adenopathy or free
fluid is seen. No thickened or dilated bowel loops are
identified.  There is no evidence of appendicitis.
IMPRESSION: 1.  Pancreas grossly normal.  There is no evidence of acute
or chronic pancreatitis.  No cystic or solid pancreatic
masses are identified.

2.  Hepatic steatosis.

3.  Gallbladder and uterus are surgically absent.

## 2020-01-28 ENCOUNTER — Encounter: Admit: 2020-01-28 | Discharge: 2020-01-28 | Payer: MEDICARE

## 2020-02-02 ENCOUNTER — Encounter: Admit: 2020-02-02 | Discharge: 2020-02-02 | Payer: MEDICARE

## 2020-02-03 ENCOUNTER — Encounter: Admit: 2020-02-03 | Discharge: 2020-02-03 | Payer: MEDICARE

## 2020-02-03 ENCOUNTER — Ambulatory Visit: Admit: 2020-02-03 | Discharge: 2020-02-04 | Payer: MEDICARE

## 2020-02-03 DIAGNOSIS — D3A8 Other benign neuroendocrine tumors: Secondary | ICD-10-CM

## 2020-02-03 NOTE — Progress Notes
The patient  reviewed the three documents sent via MyChart, agrees with the information and provided verbal consent for this visit.

## 2020-02-03 NOTE — Progress Notes
Date of Service: 02/03/2020    Shelby Rogers is a 69 y.o. female.  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  Shelby Rogers denies fevers, chills, nausea, vomiting, diarrhea, constipation, hematochezia, or melena.  The patient denies CP, SOB, and DOE.  Otherwise, the denies further constitutional symptoms       Review of Systems   All other systems reviewed and are negative.    Medical History:   Diagnosis Date   ? Allergic rhinitis    ? Asthma    ? Cataract    ? COPD (chronic obstructive pulmonary disease) (HCC)    ? COPD (chronic obstructive pulmonary disease) (HCC)    ? ESRD (end stage renal disease) (HCC)    ? Fibromyalgia    ? GERD (gastroesophageal reflux disease)    ? HLD (hyperlipidemia)    ? HTN (hypertension)    ? Hypothyroid    ? Pre-diabetes    ? Ulcer of the stomach and intestine      Surgical History:   Procedure Laterality Date   ? HX APPENDECTOMY  1986   ? HX HYSTERECTOMY  1986   ? OOPHORECTOMY  1986   ? HX CHOLECYSTECTOMY  2012   ? PARTIAL NEPHRECTOMY ROBOT-ASSISTED Right 04/25/2015    Performed by Earl Many, MD at [facility] OR   ? ESOPHAGOGASTRODUODENOSCOPY WITH ENDOSCOPIC ULTRASOUND EXAMINATION - FLEXIBLE N/A 09/25/2018    Performed by Vertell Novak, MD at [facility] ENDO   ? ESOPHAGOGASTRODUODENOSCOPY WITH TRANSENDOSCOPIC ULTRASOUND GUIDED FINE NEEDLE ASPIRATION/ BIOPSY  09/25/2018    Performed by Vertell Novak, MD at [facility] ENDO   ? ESOPHAGOGASTRODUODENOSCOPY WITH ENDOSCOPIC ULTRASOUND EXAMINATION - FLEXIBLE N/A 12/07/2019    Performed by Vertell Novak, MD at [facility] ENDO   ? CATARACT REMOVAL WITH IMPLANT Bilateral    ? TONSIL AND ADENOIDECTOMY       Family History   Family history unknown: Yes     Social History     Socioeconomic History   ? Marital status: Single     Spouse name: Not on file   ? Number of children: Not on file   ? Years of education: Not on file   ? Highest education level: Not on file Occupational History   ? Not on file   Tobacco Use   ? Smoking status: Former Smoker     Packs/day: 1.50     Years: 8.00     Pack years: 12.00     Types: Cigarettes     Quit date: 03/01/1985     Years since quitting: 34.9   ? Smokeless tobacco: Never Used   Substance and Sexual Activity   ? Alcohol use: No     Alcohol/week: 0.0 standard drinks   ? Drug use: No   ? Sexual activity: Not on file   Other Topics Concern   ? Not on file   Social History Narrative   ? Not on file           Objective:         ? albuterol 0.5% (PROVENTIL; VENTOLIN) 2.5 mg/0.5 mL nebu nebulizer solution Inhale 2.5 mg solution as directed every 6 hours as needed.   ? amLODIPine (NORVASC) 10 mg tablet    ? azithromycin (ZITHROMAX) 250 mg tablet    ? BREO ELLIPTA 100-25 mcg/dose  inhalation disk    ? ciprofloxacin-dexamethasone (CIPRODEX) 0.3-0.1 % otic suspension    ? clarithromycin (BIAXIN) 500 mg tablet    ? cyclobenzaprine (FLEXERIL) 10 mg tablet Take 10 mg by mouth three times daily as needed for Muscle Cramps.   ? dilTIAZem HCL (CARDIZEM) 30 mg tablet 120 mg twice daily.   ? EPINEPHrine (EPIPEN) 1 mg/mL injection pen (2-Pack) Inject 0.3 mg into the muscle.   ? famotidine (PEPCID) 20 mg tablet Take 20 mg by mouth twice daily.   ? fexofenadine (ALLEGRA) 180 mg tablet Take 180 mg by mouth every 24 hours as needed.   ? flecainide (TAMBOCOR) 100 mg tablet Take 50 mg by mouth twice daily.   ? fluticasone (FLONASE) 50 mcg/actuation nasal spray Apply 2 Sprays to each nostril as directed daily.   ? levothyroxine (SYNTHROID) 25 mcg tablet Take 25 mcg by mouth every morning.   ? LOSARTAN PO Take 80 mg by mouth twice daily.   ? montelukast (SINGULAIR) 10 mg tablet    ? oxybutynin XL (DITROPAN XL) 10 mg tablet Take one tablet by mouth daily. Do not cut/ crush/ chew   ? pantoprazole DR (PROTONIX) 40 mg tablet    ? potassium chloride SR (K-DUR) 20 mEq tablet    ? SEREVENT DISKUS 50 mcg/dose inhalation disk    ? tiotropium bromide (SPIRIVA RESPIMAT) 2.5 mcg/actuation inhaler Inhale 2 puffs by mouth into the lungs daily.   ? tiotropium bromide (SPIRIVA RESPIMAT) 2.5 mcg/actuation inhaler Inhale 2 puffs by mouth into the lungs daily.   ? valsartan (DIOVAN) 40 mg tablet    ? warfarin (COUMADIN) 4 mg tablet      There were no vitals filed for this visit.  There is no height or weight on file to calculate BMI.     Labs and Diagnostic Tests:  Viral Serologies:  No flowsheet data found.    CBC with Diff:  CBC with Diff Latest Ref Rng & Units 10/14/2016 01/04/2016   WBC 4.5 - 11.0 K/UL 8.9 7.2   RBC 4.0 - 5.0 M/UL 5.31(H) 5.18(H)   HGB 12.0 - 15.0 GM/DL 15.5(H) 15.3(H)   HCT 36 - 45 % 48.1(H) 47.5(H)   MCV 80 - 100 FL 90.6 91.6   MCH 26 - 34 PG 29.2 29.5   MCHC 32.0 - 36.0 G/DL 29.5 62.1   RDW 11 - 15 % 14.0 14.4   PLT 150 - 400 K/UL 234 220   MPV 7 - 11 FL 8.9 8.9       CMP:  CMP Latest Ref Rng & Units 08/23/2019 04/13/2018   NA 137 - 147 MMOL/L 141 142   K 3.5 - 5.1 MMOL/L 3.9 3.7   CL 98 - 110 MMOL/L 105 108   CO2 21 - 30 MMOL/L 26 27   GAP 3 - 12 10 7    BUN 7 - 25 MG/DL 13 14   CR 0.4 - 3.08 MG/DL 6.57 8.46   GLUX 70 - 962 MG/DL 952(W) 413(K)   CA 8.5 - 10.6 MG/DL 9.4 9.5   TP 6.0 - 8.0 G/DL - -   ALB 3.5 - 5.0 G/DL - -   ALKP 25 - 440 U/L - -   ALT 7 - 56 U/L - -   TBILI 0.3 - 1.2 MG/DL - -   GFR >10 mL/min >27 >60   GFRAA >60 mL/min >60 >60       Other Common Labs:  No flowsheet data found.  Imaging:  Results for orders placed during the hospital encounter of 08/23/19   CT ABD WO/W PELVIS W    Impression 1. Status post partial right nephrectomy without local tumor recurrence or abdominopelvic metastatic disease.  2. Stable, small, arterial phase hyperenhancing nodule within the pancreatic head. This lesion has been stable over serial examinations, though was not present in the most remote prior comparison study from August 2015. 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 A pancreatic metastasis is an additional consideration.  3. Mild hepatic steatosis.       Finalized by Prince Rome, D.O. on 08/23/2019 12:40 PM. Dictated by Prince Rome, D.O. on 08/23/2019 12:25 PM.       Results for orders placed during the hospital encounter of 08/23/19   CHEST 2 VIEWS    Impression Stable chest radiograph demonstrating no acute cardiopulmonary abnormalities.       Finalized by Darel Hong, M.D. on 08/23/2019 11:37 AM. Dictated by Darel Hong, M.D. on 08/23/2019 11:36 AM.         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  Discussed local resection and Whipple with patient and described r/b/c's.  Wishes to watch for now.       Obtained patient's verbal consent to treat them and their agreement to [facility] financial policy and NPP via this telehealth visit during the [facility] Emergency

## 2020-02-04 ENCOUNTER — Encounter: Admit: 2020-02-04 | Discharge: 2020-02-04 | Payer: MEDICARE

## 2020-02-04 NOTE — Telephone Encounter
Spoke with patient today in f/u from her visit with Dr. Stevie Kern.  She reports she was not ready to make a decision about surgery yesterday.  She was still feeling in shock from the information he gave her.  Patient lives alone.  She has a cat.  Her daughter lives 2 hours from her.  She is concerned about care for her cat and who will take care of her after surgery is she did that.  We talked about skilled nursing and she said there is a swing bed option at her local hospital.  She is concerned about her insurance benefits.  Informed patient of SW services that she can visit with to help navigate these benefits.  She is plugged in with a local church and does feel she has friends and church family that would assist with her cat and other needs if she asked.  Patient is very methodical in regards to risk of waiting vs surgery and it's risks.  She has more questions that I encouraged her to discuss with Dr. Macky Lower at her visit.  It doesn't sound like patient gets much support from her family.  She requests that Dr. Macky Lower speak directly to her daughter at the visit about the kind of help that will be needed so it is not having to come from her.  30 minutes spent with patient on the phone answering questions and providing emotional support.  Will continue to be available as needed.

## 2020-02-09 ENCOUNTER — Encounter: Admit: 2020-02-09 | Discharge: 2020-02-09 | Payer: MEDICARE

## 2020-02-09 NOTE — Telephone Encounter
Received call from Shelby Rogers and she is not feeling well from her COVID vaccine early in the week and she wishes to reschedule.  appt rescheduled to 3/25 @ 1:20pm.

## 2020-02-10 ENCOUNTER — Encounter: Admit: 2020-02-10 | Discharge: 2020-02-10 | Payer: MEDICARE

## 2020-02-24 ENCOUNTER — Encounter: Admit: 2020-02-24 | Discharge: 2020-02-24 | Payer: MEDICARE

## 2020-02-24 DIAGNOSIS — I10 Essential (primary) hypertension: Secondary | ICD-10-CM

## 2020-02-24 DIAGNOSIS — K289 Gastrojejunal ulcer, unspecified as acute or chronic, without hemorrhage or perforation: Secondary | ICD-10-CM

## 2020-02-24 DIAGNOSIS — M797 Fibromyalgia: Secondary | ICD-10-CM

## 2020-02-24 DIAGNOSIS — J45909 Unspecified asthma, uncomplicated: Secondary | ICD-10-CM

## 2020-02-24 DIAGNOSIS — E039 Hypothyroidism, unspecified: Secondary | ICD-10-CM

## 2020-02-24 DIAGNOSIS — K219 Gastro-esophageal reflux disease without esophagitis: Secondary | ICD-10-CM

## 2020-02-24 DIAGNOSIS — R7303 Prediabetes: Secondary | ICD-10-CM

## 2020-02-24 DIAGNOSIS — C7A098 Malignant carcinoid tumors of other sites: Secondary | ICD-10-CM

## 2020-02-24 DIAGNOSIS — E785 Hyperlipidemia, unspecified: Secondary | ICD-10-CM

## 2020-02-24 DIAGNOSIS — D3A8 Other benign neuroendocrine tumors: Secondary | ICD-10-CM

## 2020-02-24 DIAGNOSIS — H269 Unspecified cataract: Secondary | ICD-10-CM

## 2020-02-24 DIAGNOSIS — C7A8 Other malignant neuroendocrine tumors: Secondary | ICD-10-CM

## 2020-02-24 DIAGNOSIS — J449 Chronic obstructive pulmonary disease, unspecified: Secondary | ICD-10-CM

## 2020-02-24 DIAGNOSIS — J309 Allergic rhinitis, unspecified: Secondary | ICD-10-CM

## 2020-02-24 DIAGNOSIS — N186 End stage renal disease: Secondary | ICD-10-CM

## 2020-02-24 NOTE — Patient Instructions
Dr.Anwaar Saeed,  Medical Oncologist specializing in Gastrointestinal malignancies   Ra Pfiester, Nurse Practitioner     Phone: [phone]   Monday-Friday 8:00am- 4:00pm   Fax: [phone]    After Hours Phone: [phone]- On call number for urgent needs outside of business hours ask for the On call Oncology fellow to be paged and they will call you back    Medication refills: please contact your pharmacy for medication refills, if they do not have any refills on file they will contact the office, make sure they have our correct contact information on file   P: [phone], F: [phone]    MyChart Messages: All mychart messages are answered by the nurses, even if you select to send the message to Dr.Saeed or Temperence Zenor. We often communicate with Dr.Saeed or Hawraa Stambaugh regarding how to answer these messages. Messages are answered 8:00am-4:00pm Monday-Friday.    Phone Calls: We are typically not at our desks where our phones are located as we are in clinic. Please leave us a message as we check our messages several times per day. It is our goal to answer these messages as soon as possible. Messages are answered from 8:00am-4:00 pm Monday-Friday. Please make sure you leave your full name with the spelling, date of birth, and reason for your call when leaving a message.

## 2020-02-24 NOTE — Progress Notes
Name: Shelby Rogers          MRN: [id number]      DOB: 11/03/1951      AGE: 69 y.o.   DATE OF SERVICE: 02/24/2020    Subjective:             Reason for Visit:  New CA Pt      Shelby Rogers is a 69 y.o. female newly diagnosed with neuroendocrine cancer.    Cancer Staging  No matching staging information was found for the patient.    History of Present Illness  Shelby Rogers is a 69 y.o. 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 She is status post right robotic partial nephrectomy with negative margins 04/25/15. Surveillance CT scans were completed in 2017 which incidentally found a subcentimeter nodule on the head of the pancreas. She had an EUS 09/25/18 revealed a 7 mm round hypoechoic lesion. This was surrounded by blood vessels. FNA was completed which revealed well-differentiated neuroendocrine tumor. MRI 10/15/18 did not reveal a pancreatic lesion. She had a CT 08/23/19 that revealed a stable, small, arterial phase hyperenhancing nodule within the pancreatic head. The size was stable compared to previous imaging. An EUS was repeated 12/07/19 which revealed an 8 mm hypoechoic mass on the head of the pancreas. Due to the blood vessels around the lesion it was difficult to perform FNA. CT scan 01/25/20 did not identify a pancreatic lesion. She met with Dr.Kumer 02/03/20, whipple surgery was offered but she declined at this time. She is here today to establish care.       Review of Systems   Constitutional: Negative for activity change, appetite change, chills, fatigue, fever and unexpected weight change.   HENT: Negative for congestion, rhinorrhea and sinus pressure.    Eyes: Negative for visual disturbance.   Respiratory: Negative for cough, shortness of breath and wheezing.    Cardiovascular: Positive for palpitations. Negative for chest pain.   Gastrointestinal: Negative for abdominal distention, abdominal pain, blood in stool, constipation, diarrhea and nausea.   Genitourinary: Negative for dysuria, frequency and urgency.   Musculoskeletal: Negative for arthralgias and myalgias.   Skin: Negative for color change and rash.   Neurological: Negative for weakness and headaches.   Psychiatric/Behavioral: The patient is not nervous/anxious.          Objective:         ? albuterol 0.5% (PROVENTIL; VENTOLIN) 2.5 mg/0.5 mL nebu nebulizer solution Inhale 2.5 mg solution as directed every 6 hours as needed.   ? amLODIPine (NORVASC) 10 mg tablet    ? azithromycin (ZITHROMAX) 250 mg tablet    ? BREO ELLIPTA 100-25 mcg/dose inhalation disk    ? ciprofloxacin-dexamethasone (CIPRODEX) 0.3-0.1 % otic suspension    ? clarithromycin (BIAXIN) 500 mg tablet    ? cyclobenzaprine (FLEXERIL) 10 mg tablet Take 10 mg by mouth three times daily as needed for Muscle Cramps.   ? dilTIAZem HCL (CARDIZEM) 30 mg tablet 120 mg twice daily.   ? EPINEPHrine (EPIPEN) 1 mg/mL injection pen (2-Pack) Inject 0.3 mg into the muscle.   ? famotidine (PEPCID) 20 mg tablet Take 20 mg by mouth twice daily.   ? fexofenadine (ALLEGRA) 180 mg tablet Take 180 mg by mouth every 24 hours as needed.   ? flecainide (TAMBOCOR) 100 mg tablet Take 50 mg by mouth twice daily.   ? fluticasone (FLONASE) 50 mcg/actuation nasal spray Apply 2 Sprays to  each nostril as directed daily.   ? levothyroxine (SYNTHROID) 25 mcg tablet Take 25 mcg by mouth every morning.   ? LOSARTAN PO Take 80 mg by mouth twice daily.   ? montelukast (SINGULAIR) 10 mg tablet    ? oxybutynin XL (DITROPAN XL) 10 mg tablet Take one tablet by mouth daily. Do not cut/ crush/ chew   ? pantoprazole DR (PROTONIX) 40 mg tablet    ? potassium chloride SR (K-DUR) 20 mEq tablet    ? SEREVENT DISKUS 50 mcg/dose inhalation disk    ? tiotropium bromide (SPIRIVA RESPIMAT) 2.5 mcg/actuation inhaler Inhale 2 puffs by mouth into the lungs daily.   ? tiotropium bromide (SPIRIVA RESPIMAT) 2.5 mcg/actuation inhaler Inhale 2 puffs by mouth into the lungs daily.   ? valsartan (DIOVAN) 40 mg tablet    ? warfarin (COUMADIN) 4 mg tablet      Vitals:    02/24/20 1323   BP: (!) 166/74   BP Source: Arm, Right Lower   Patient Position: Sitting   Pulse: 74   Resp: 16   Temp: 36.9 ?C (98.4 ?F)   TempSrc: Oral   SpO2: 99%   Weight: 107.6 kg (237 lb 3.2 oz)   Height: 163.8 cm (64.49)   PainSc: Zero     Body mass index is 40.1 kg/m?Marland Kitchen     Pain Score: Zero       Fatigue Scale: 1    Pain Addressed:  N/A    Patient Evaluated for a Clinical Trial: No treatment clinical trial available for this patient.     [place] Cooperative Oncology Group performance status is 0, Fully active, able to carry on all pre-disease performance without restriction.Marland Kitchen     Physical Exam  Vitals signs reviewed.   Constitutional:       General: She is not in acute distress.     Appearance: Normal appearance.   HENT:      Head: Normocephalic and atraumatic.      Nose: Nose normal.      Mouth/Throat:      Mouth: Mucous membranes are moist.      Pharynx: Oropharynx is clear.   Eyes:      General: No scleral icterus.     Conjunctiva/sclera: Conjunctivae normal.      Pupils: Pupils are equal, round, and reactive to light.   Neck:      Musculoskeletal: Normal range of motion.   Cardiovascular:      Rate and Rhythm: Normal rate and regular rhythm.      Pulses: Normal pulses.      Heart sounds: Normal heart sounds. No murmur.   Pulmonary:      Effort: Pulmonary effort is normal. No respiratory distress.      Breath sounds: Normal breath sounds. No wheezing.   Abdominal:      General: Bowel sounds are normal. There is no distension.      Palpations: Abdomen is soft.      Tenderness: There is no abdominal tenderness.   Musculoskeletal: Normal range of motion.   Skin:     General: Skin is warm and dry.   Neurological:      General: No focal deficit present.      Mental Status: She is alert and oriented to person, place, and time.   Psychiatric:         Mood and Affect: Mood normal.         Behavior: Behavior normal.  Thought Content: Thought content normal.         Judgment: Judgment normal.          CBC w diff    Lab Results   Component Value Date/Time    WBC 8.9 10/14/2016 01:14 PM    RBC 5.31 (H) 10/14/2016 01:14 PM    HGB 15.5 (H) 10/14/2016 01:14 PM    HCT 48.1 (H) 10/14/2016 01:14 PM    MCV 90.6 10/14/2016 01:14 PM    MCH 29.2 10/14/2016 01:14 PM    MCHC 32.2 10/14/2016 01:14 PM    RDW 14.0 10/14/2016 01:14 PM    PLTCT 234 10/14/2016 01:14 PM    MPV 8.9 10/14/2016 01:14 PM    No results found for: NEUT, ANC, LYMA, ALC, MONA, AMC, EOSA, AEC, BASA, ABC     Comprehensive Metabolic Profile    Lab Results   Component Value Date/Time    NA 141 08/23/2019 09:54 AM    K 3.9 08/23/2019 09:54 AM    CL 105 08/23/2019 09:54 AM    CO2 26 08/23/2019 09:54 AM    GAP 10 08/23/2019 09:54 AM    BUN 13 08/23/2019 09:54 AM    CR 0.91 08/23/2019 09:54 AM    GLU 101 (H) 08/23/2019 09:54 AM    Lab Results   Component Value Date/Time    CA 9.4 08/23/2019 09:54 AM    ALBUMIN 4.4 04/14/2017 01:45 PM    TOTPROT 7.1 04/14/2017 01:45 PM    ALKPHOS 57 04/14/2017 01:45 PM    AST 22 04/14/2017 01:45 PM    ALT 23 04/14/2017 01:45 PM    TOTBILI 0.6 04/14/2017 01:45 PM    GFR >60 08/23/2019 09:54 AM    GFRAA >60 08/23/2019 09:54 AM             Assessment and Plan:  1- Well-differentiated neuroendocrine cancer  Surveillance CT scans were completed in 2017 which incidentally found a subcentimeter nodule on the head of the pancreas. She had an EUS 09/25/18 revealed a 7 mm round hypoechoic lesion. This was surrounded by blood vessels. FNA was completed which revealed well-differentiated neuroendocrine tumor. MRI 10/15/18 did not reveal a pancreatic lesion. She had a CT 08/23/19 that revealed a stable, small, arterial phase hyperenhancing nodule within the pancreatic head. The size was stable compared to previous imaging. An EUS was repeated 12/07/19 which revealed an 8 mm hypoechoic mass on the head of the pancreas. Due to the blood vessels around the lesion it was difficult to perform FNA. CT scan 01/25/20 did not identify a pancreatic lesion. She met with Dr.Kumer 02/03/20, whipple surgery was offered but she declined at this time.     Plan:  -Discussed her neuroendocrine cancer appears resectable based on her EUS findings and no metastatic disease present on imaging. She does not want to proceed with surgery at this time.  -Will continue to monitor the size of her pancreatic lesion. Return in 3 months with lab, office visit, and CT scans    2- Palpitations  -Follows with cardiology at [facility] in [place]. She has history of atrial fibrillation. Plans to undergo ablation.      The patient was allowed to ask questions and voice concerns; these were addressed to the best of our ability. They expressed understanding of what was explained to them, and they agreed with the present plan. RTC in 3 months with labs and to see a provider. Patient has the phone numbers for the Cancer Center and was  instructed on how to contact us with any questions or concerns. My collaborating physician on this case is Dr..  ?  Wonda Cerise, APRN-NP      Attestation by Elwyn Lade, MD:  I saw this patient with Jodie Echevaria APRN, I confirmed her history with the patient and made appropriate corrections, confirmed the key physical findings with any new findings added and reviewed pertinent laboratory and x-rays.  My assessment and plan is as I have outlined below, which I have fully discussed and reviewed with the patient.      This is a 20 yr f with newly diagnosed pancreatic neuroendocrine cancer. Surveillance CT scans were completed in 2017 which incidentally found a subcentimeter nodule on the head of the pancreas. She had an EUS 09/25/18 revealed a 7 mm round hypoechoic lesion. This was surrounded by blood vessels. FNA was completed which revealed well-differentiated neuroendocrine tumor. MRI 10/15/18 did not reveal a pancreatic lesion. She had a CT 08/23/19 that revealed a stable, small, arterial phase hyperenhancing nodule within the pancreatic head. The size was stable compared to previous imaging. An EUS was repeated 12/07/19 which revealed an 8 mm hypoechoic mass on the head of the pancreas. Due to the blood vessels around the lesion it was difficult to perform FNA. CT scan 01/25/20 did not identify a pancreatic lesion. She met with Dr.Kumer 02/03/20, whipple surgery was offered but she declined at this time.       On exam, he is alert and not in acute distress, vital signs were reviewed, breathing with normal effort and no wheezing, heart with normal rate, abdomen soft nontender, extremities no edema.     Labs and scans were reviewed     Plan:  -Discussed her neuroendocrine cancer appears resectable based on her scans and EUS findings and no metastatic disease present on imaging. She does not want to proceed with surgery at this time.  -She prefers to have a cardiac procedure (ablation for a fibrillation) done first before considering the pancreatic surgery.     RTC in 3 months with lab, office visit, and CT scans    2- Palpitations  -Follows with cardiology at [facility] in [place]. She has known history of atrial fibrillation. Plans to undergo ablation.

## 2020-02-28 ENCOUNTER — Encounter: Admit: 2020-02-28 | Discharge: 2020-02-28 | Payer: MEDICARE

## 2020-02-29 ENCOUNTER — Encounter: Admit: 2020-02-29 | Discharge: 2020-02-29 | Payer: MEDICARE

## 2020-03-08 ENCOUNTER — Encounter: Admit: 2020-03-08 | Discharge: 2020-03-08 | Payer: MEDICARE

## 2020-03-09 ENCOUNTER — Encounter: Admit: 2020-03-09 | Discharge: 2020-03-09 | Payer: MEDICARE

## 2020-04-24 ENCOUNTER — Encounter: Admit: 2020-04-24 | Discharge: 2020-04-24 | Payer: MEDICARE

## 2020-04-24 DIAGNOSIS — I10 Essential (primary) hypertension: Secondary | ICD-10-CM

## 2020-04-24 DIAGNOSIS — I4891 Unspecified atrial fibrillation: Secondary | ICD-10-CM

## 2020-04-24 DIAGNOSIS — N186 End stage renal disease: Secondary | ICD-10-CM

## 2020-04-24 DIAGNOSIS — K219 Gastro-esophageal reflux disease without esophagitis: Secondary | ICD-10-CM

## 2020-04-24 DIAGNOSIS — H269 Unspecified cataract: Secondary | ICD-10-CM

## 2020-04-24 DIAGNOSIS — J45909 Unspecified asthma, uncomplicated: Secondary | ICD-10-CM

## 2020-04-24 DIAGNOSIS — K289 Gastrojejunal ulcer, unspecified as acute or chronic, without hemorrhage or perforation: Secondary | ICD-10-CM

## 2020-04-24 DIAGNOSIS — G4733 Obstructive sleep apnea (adult) (pediatric): Secondary | ICD-10-CM

## 2020-04-24 DIAGNOSIS — I513 Intracardiac thrombosis, not elsewhere classified: Secondary | ICD-10-CM

## 2020-04-24 DIAGNOSIS — E039 Hypothyroidism, unspecified: Secondary | ICD-10-CM

## 2020-04-24 DIAGNOSIS — M797 Fibromyalgia: Secondary | ICD-10-CM

## 2020-04-24 DIAGNOSIS — M26609 Unspecified temporomandibular joint disorder, unspecified side: Secondary | ICD-10-CM

## 2020-04-24 DIAGNOSIS — J309 Allergic rhinitis, unspecified: Secondary | ICD-10-CM

## 2020-04-24 DIAGNOSIS — E785 Hyperlipidemia, unspecified: Secondary | ICD-10-CM

## 2020-04-24 DIAGNOSIS — J449 Chronic obstructive pulmonary disease, unspecified: Secondary | ICD-10-CM

## 2020-04-24 DIAGNOSIS — R7303 Prediabetes: Secondary | ICD-10-CM

## 2020-04-25 ENCOUNTER — Encounter: Admit: 2020-04-25 | Discharge: 2020-04-25 | Payer: MEDICARE

## 2020-04-25 ENCOUNTER — Ambulatory Visit: Admit: 2020-04-25 | Discharge: 2020-04-26 | Payer: MEDICARE

## 2020-04-25 DIAGNOSIS — I513 Intracardiac thrombosis, not elsewhere classified: Secondary | ICD-10-CM

## 2020-04-25 DIAGNOSIS — I4811 Longstanding persistent atrial fibrillation: Secondary | ICD-10-CM

## 2020-04-25 DIAGNOSIS — E039 Hypothyroidism, unspecified: Secondary | ICD-10-CM

## 2020-04-25 DIAGNOSIS — K219 Gastro-esophageal reflux disease without esophagitis: Secondary | ICD-10-CM

## 2020-04-25 DIAGNOSIS — K289 Gastrojejunal ulcer, unspecified as acute or chronic, without hemorrhage or perforation: Secondary | ICD-10-CM

## 2020-04-25 DIAGNOSIS — J449 Chronic obstructive pulmonary disease, unspecified: Secondary | ICD-10-CM

## 2020-04-25 DIAGNOSIS — I10 Essential (primary) hypertension: Secondary | ICD-10-CM

## 2020-04-25 DIAGNOSIS — N186 End stage renal disease: Secondary | ICD-10-CM

## 2020-04-25 DIAGNOSIS — M26609 Unspecified temporomandibular joint disorder, unspecified side: Secondary | ICD-10-CM

## 2020-04-25 DIAGNOSIS — J45909 Unspecified asthma, uncomplicated: Secondary | ICD-10-CM

## 2020-04-25 DIAGNOSIS — I4891 Unspecified atrial fibrillation: Secondary | ICD-10-CM

## 2020-04-25 DIAGNOSIS — Z7901 Long term (current) use of anticoagulants: Secondary | ICD-10-CM

## 2020-04-25 DIAGNOSIS — R7303 Prediabetes: Secondary | ICD-10-CM

## 2020-04-25 DIAGNOSIS — M797 Fibromyalgia: Secondary | ICD-10-CM

## 2020-04-25 DIAGNOSIS — J309 Allergic rhinitis, unspecified: Secondary | ICD-10-CM

## 2020-04-25 DIAGNOSIS — H269 Unspecified cataract: Secondary | ICD-10-CM

## 2020-04-25 DIAGNOSIS — G4733 Obstructive sleep apnea (adult) (pediatric): Secondary | ICD-10-CM

## 2020-04-25 DIAGNOSIS — E785 Hyperlipidemia, unspecified: Secondary | ICD-10-CM

## 2020-04-28 ENCOUNTER — Encounter: Admit: 2020-04-28 | Discharge: 2020-04-28 | Payer: MEDICARE

## 2020-04-28 DIAGNOSIS — I513 Intracardiac thrombosis, not elsewhere classified: Secondary | ICD-10-CM

## 2020-04-28 DIAGNOSIS — E785 Hyperlipidemia, unspecified: Secondary | ICD-10-CM

## 2020-04-28 DIAGNOSIS — I10 Essential (primary) hypertension: Secondary | ICD-10-CM

## 2020-04-28 DIAGNOSIS — G4733 Obstructive sleep apnea (adult) (pediatric): Secondary | ICD-10-CM

## 2020-04-28 DIAGNOSIS — N186 End stage renal disease: Secondary | ICD-10-CM

## 2020-04-28 DIAGNOSIS — R7303 Prediabetes: Secondary | ICD-10-CM

## 2020-04-28 DIAGNOSIS — M26609 Unspecified temporomandibular joint disorder, unspecified side: Secondary | ICD-10-CM

## 2020-04-28 DIAGNOSIS — J309 Allergic rhinitis, unspecified: Secondary | ICD-10-CM

## 2020-04-28 DIAGNOSIS — K219 Gastro-esophageal reflux disease without esophagitis: Secondary | ICD-10-CM

## 2020-04-28 DIAGNOSIS — I4891 Unspecified atrial fibrillation: Secondary | ICD-10-CM

## 2020-04-28 DIAGNOSIS — M797 Fibromyalgia: Secondary | ICD-10-CM

## 2020-04-28 DIAGNOSIS — J449 Chronic obstructive pulmonary disease, unspecified: Secondary | ICD-10-CM

## 2020-04-28 DIAGNOSIS — K289 Gastrojejunal ulcer, unspecified as acute or chronic, without hemorrhage or perforation: Secondary | ICD-10-CM

## 2020-04-28 DIAGNOSIS — J45909 Unspecified asthma, uncomplicated: Secondary | ICD-10-CM

## 2020-04-28 DIAGNOSIS — H269 Unspecified cataract: Secondary | ICD-10-CM

## 2020-04-28 DIAGNOSIS — E039 Hypothyroidism, unspecified: Secondary | ICD-10-CM

## 2020-05-02 ENCOUNTER — Encounter: Admit: 2020-05-02 | Discharge: 2020-05-02 | Payer: MEDICARE

## 2020-05-02 DIAGNOSIS — I4811 Longstanding persistent atrial fibrillation: Secondary | ICD-10-CM

## 2020-05-03 ENCOUNTER — Encounter: Admit: 2020-05-03 | Discharge: 2020-05-03 | Payer: MEDICARE

## 2020-05-04 ENCOUNTER — Encounter: Admit: 2020-05-04 | Discharge: 2020-05-04 | Payer: MEDICARE

## 2020-05-17 IMAGING — DX Chest
2 series · 2 of 2 positions shown · non-contrast
Comparison: None.

Procedure(s): XR chest 2V

EXAMINATION: Chest 2 view.
HISTORY: COPD. Shortness of breath.

[PA]
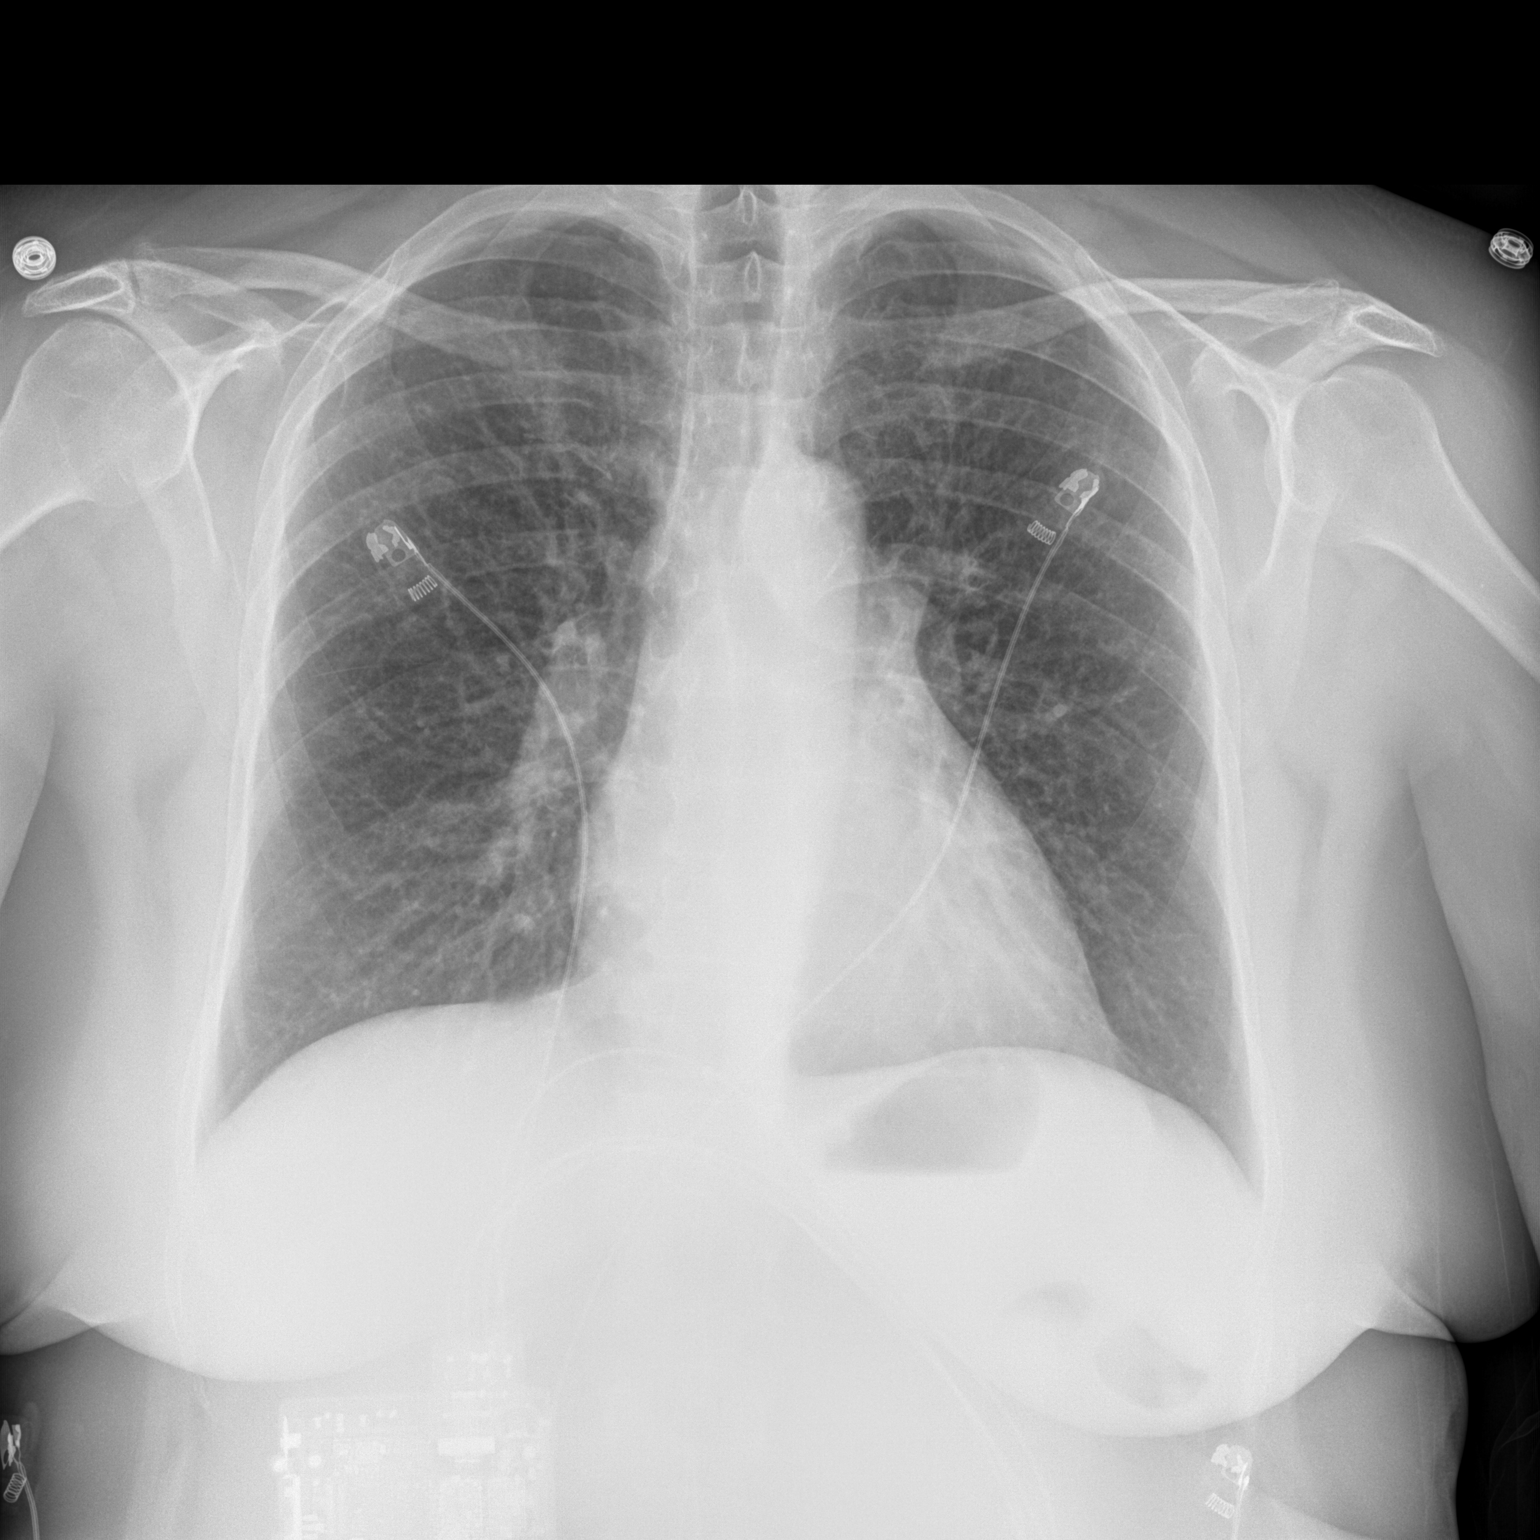

[left lateral]
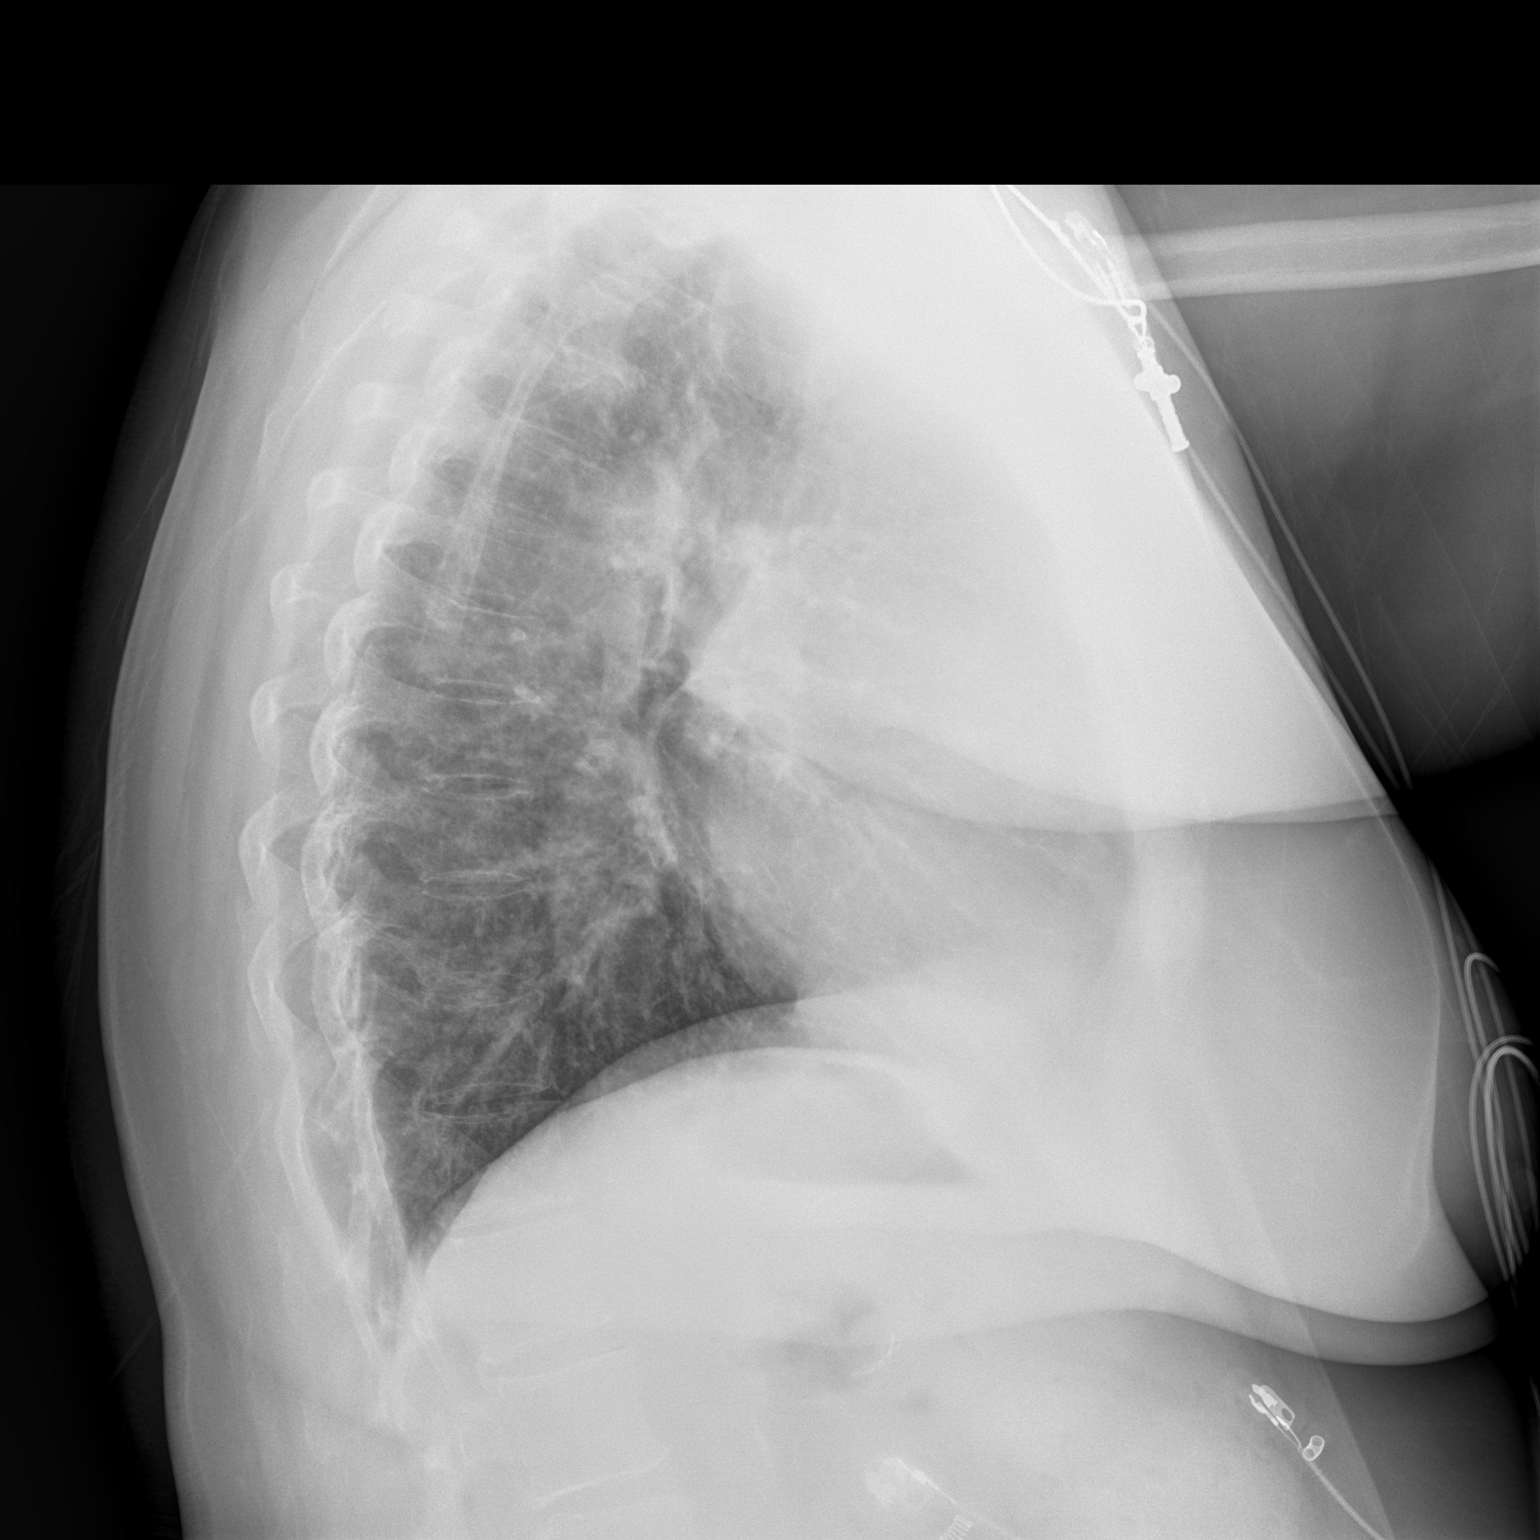

[2 of 2 positions shown; findings below may reference images not displayed]

FINDINGS: Frontal and lateral views of the chest show no focal
pneumonic consolidation, pleural effusion, or pneumothorax.
Heart size and mediastinal contours are normal.  Pulmonary
vascularity is normal. There is atherosclerotic
calcification of the aorta. Cholecystectomy clips are seen
in the right upper quadrant.
IMPRESSION: 1. No acute cardiopulmonary abnormality.

## 2020-05-18 ENCOUNTER — Encounter: Admit: 2020-05-18 | Discharge: 2020-05-18 | Payer: MEDICARE

## 2020-05-19 ENCOUNTER — Encounter: Admit: 2020-05-19 | Discharge: 2020-05-19 | Payer: MEDICARE

## 2020-05-19 NOTE — Telephone Encounter
Patient called and would like to get her labs locally.  I have faxed orders, at her request, to [phone], [facility].

## 2020-05-19 NOTE — Progress Notes
Returned pt's call.  She was just discharged today from [facility] in [place], [place].  Was admitted for COPD she reports.   Asking for PET scan and lab orders to be faxed to [facility] in [place], [place].  Lawson Fiscal, RN  Agreed to fax to [phone] so that she may have these done locally next week.  She is also not sure when she will be able to make rv with dr Roselle Locus.  cxl next week.  Will wait to hear back from pt.

## 2020-05-22 ENCOUNTER — Encounter: Admit: 2020-05-22 | Discharge: 2020-05-22 | Payer: MEDICARE

## 2020-05-22 DIAGNOSIS — D3A8 Other benign neuroendocrine tumors: Secondary | ICD-10-CM

## 2020-05-22 DIAGNOSIS — C641 Malignant neoplasm of right kidney, except renal pelvis: Secondary | ICD-10-CM

## 2020-05-23 ENCOUNTER — Encounter: Admit: 2020-05-23 | Discharge: 2020-05-23 | Payer: MEDICARE

## 2020-05-23 NOTE — Telephone Encounter
Call from [place] asking to schedule with Dr. Cathie Beams.  Please return her call.

## 2020-05-24 ENCOUNTER — Encounter: Admit: 2020-05-24 | Discharge: 2020-05-24 | Payer: MEDICARE

## 2020-06-02 ENCOUNTER — Encounter: Admit: 2020-06-02 | Discharge: 2020-06-02 | Payer: MEDICARE

## 2020-06-16 ENCOUNTER — Encounter: Admit: 2020-06-16 | Discharge: 2020-06-16 | Payer: MEDICARE

## 2020-06-20 ENCOUNTER — Encounter: Admit: 2020-06-20 | Discharge: 2020-06-20 | Payer: MEDICARE

## 2020-06-20 DIAGNOSIS — I4891 Unspecified atrial fibrillation: Secondary | ICD-10-CM

## 2020-06-20 DIAGNOSIS — I4811 Longstanding persistent atrial fibrillation: Secondary | ICD-10-CM

## 2020-06-20 DIAGNOSIS — I10 Essential (primary) hypertension: Secondary | ICD-10-CM

## 2020-06-20 MED ORDER — PANTOPRAZOLE 40 MG PO TBEC
40 mg | ORAL_TABLET | Freq: Two times a day (BID) | ORAL | 0 refills | 90.00000 days | Status: AC
Start: 2020-06-20 — End: ?

## 2020-06-20 MED ORDER — SODIUM CHLORIDE 0.9 % IV SOLP
INTRAVENOUS | 0 refills
Start: 2020-06-20 — End: ?

## 2020-06-20 MED ORDER — LIDOCAINE HCL 2 % MM JELP
Freq: Once | TOPICAL | 0 refills
Start: 2020-06-20 — End: ?

## 2020-06-20 MED ORDER — LIDOCAINE (PF) 10 MG/ML (1 %) IJ SOLN
.2 mL | INTRAMUSCULAR | 0 refills | PRN
Start: 2020-06-20 — End: ?

## 2020-06-20 NOTE — Telephone Encounter
-----   Message from [place], [place] sent at 06/20/2020  8:14 AM CDT -----  Regarding: Pre LAAA CCTA needed  Pre-Afib ablation CCTA needed on 07/06/20 at 1330. Afib ablation scheduled for 8/18 with [facility].

## 2020-06-20 NOTE — Patient Instructions
Coronary CT Angiography Instructions    Shelby Rogers  [id number]  02-14-1951  06/20/2020    ARRIVAL TIME    Please report to the Cardiovascular Medicine Clinic at the [facility] on: 07/12/20  Please arrive at the following time: 11:30 to cardiology office for pre ablation cta scan - 1:00 pre anesthesia assessment 2:30 Office visit - (may go get food after cta if you want)      DO NOT EAT FOR 4 HOURS PRIOR TO YOUR PROCEDURE.MAY  HAVE TOAST OR FRUIT BEFORE 8AM DAY OF CTA  YOU SHOULD DRINK PLENTY OF CLEAR LIQUIDS UP TO ARRIVAL AT OFFICE.    Pre-Procedure Heart Rate Medication Instructions: take all am meds It is important to take these medications exactly as written.  These medications prepare you for the procedure.                                      Do not take any non-steroidal inflammatory medications, (ibuprofen, Aleve, Advil, etc.) for 48 hours beginning the day of the procedure.    Hold All Diuretics For 24 Hours Beginning The Day Of The Procedure: Verified    You May Take All Other Medications With Water The Morning Of The Procedure: Verified              ALLERGIES    Allergies   Allergen Reactions   ? Penicillins MENTAL STATUS CHANGES and SYNCOPE   ? Phenergan [Promethazine] DYSTONIA and AGITATION   ? Sulfa (Sulfonamide Antibiotics) BLISTERS     Patient reports blisters on neck and chest after 2 days of using a sulfa ointment    ? Oxycodone ITCHING and EDEMA   ? Prednisone MUSCLE PAIN and SHORTNESS OF BREATH   ? Duloxetine NAUSEA AND VOMITING   ? Oxybutynin SEE COMMENTS     Decreased urination    ? Spironolactone SEE COMMENTS     Involuntarty muscle jerking, twitching, sleepy, fatigue.    ? Tramadol UNKNOWN   ? Xyzal [Levocetirizine] SEE COMMENTS     Involuntary muscle movements        SPECIAL ALLERGY INSTRUCTIONS         CURRENT MEDICATIONS  Outpatient Encounter Medications as of 06/20/2020   Medication Sig Dispense Refill   ? albuterol 0.5% (PROVENTIL; VENTOLIN) 2.5 mg/0.5 mL nebu nebulizer solution Inhale 2.5 mg solution as directed every 6 hours as needed.     ? dilTIAZem HCL (CARDIZEM) 30 mg tablet 120 mg twice daily. Indications: takes 180mg  in PM     ? famotidine (PEPCID) 20 mg tablet Take 20 mg by mouth twice daily.     ? fexofenadine (ALLEGRA) 180 mg tablet Take 180 mg by mouth every 24 hours as needed.     ? fluticasone (FLONASE) 50 mcg/actuation nasal spray Apply 2 Sprays to each nostril as directed daily.     ? levothyroxine (SYNTHROID) 25 mcg tablet Take 25 mcg by mouth every morning.     ? montelukast (SINGULAIR) 10 mg tablet      ? pantoprazole DR (PROTONIX) 40 mg tablet Take one tablet by mouth twice daily. Starting 1 week prior to procedure and for 1 month post procedure. 90 tablet 0   ? pantoprazole DR (PROTONIX) 40 mg tablet      ? tiotropium bromide (SPIRIVA RESPIMAT) 2.5 mcg/actuation inhaler Inhale 2 puffs by mouth into the lungs daily.     ?  valsartan (DIOVAN) 40 mg tablet Take 80 mg by mouth twice daily.     ? warfarin (COUMADIN) 4 mg tablet      ? warfarin (COUMADIN) 5 mg tablet Take 5 mg by mouth daily as needed.     ? zolpidem (AMBIEN) 5 mg tablet Take 2.5 mg by mouth nightly as needed.       No facility-administered encounter medications on file as of 06/20/2020.       -No caffeine or smoking after midnight before the procedure.  -Please arrange for someone to drive you home after the procedure.  -No driving for 3 hours after the procedure.  -Please leave all valuables at home (wallet/purse).              PRE-PROCEDURE LAB WORK    Lab Work: [employer]  Other Lab: ENCLOSED PLS FIND LAB ORDER TO TAKE TAKE TO LAB OF YOUR CHOICE AS SOON AS YOU RECEIVE THE ORDER - NO FASTING NOTED  Date Lab Drawn: 07/03/20    If you have any questions, please call the [facility] office at [phone] or [phone].    Form Completed By: Kevan Ny RN  Date Completed: 06/20/20

## 2020-06-20 NOTE — Progress Notes
Instructions reviewed with pt on the phone and sent through MyChart.     Spoke to RN at Dr. Nechama Guard, PCP, office. Informed her that we need weekly INRs starting this week and 4 weeks post-op. Gave our fax number to send weekly INRs. Need INR 8/16 also to dose accurately pre-procedure. RN verbalized understanding and agreement to fax INRs to our office.

## 2020-06-20 NOTE — Progress Notes
Pt notified via MyChart of [facility] recs.       Janalyn Rouse, MD  Glennon Mac, RN   That would be fine. ?Thanks for checking.       ?       ?  ----- Message -----   From: Glennon Mac, RN   Sent: 06/20/2020 ? 1:58 PM CDT   To: Janalyn Rouse, MD   Subject: pepcid/protonix ? ? ? ? ? ? ? ? ? ? ? ? ? ? ?     Pts AF ablation set up for 8/18. When I called pt to go through pre-procedure instructions, she stated she takes Pepcid and Protonix. She said the pepcid was started by her local cardiologist after some procedure went bad. ?aspirated. Should she continue to take the pepcid along with the normal AF protonix dosing during the week before and month after?   Please advise.     Thanks,   Alcario Drought, RN

## 2020-06-20 NOTE — Telephone Encounter
-----   Message from Wamic Meerdink sent at 06/20/2020  9:15 AM CDT -----  Regarding: Tikosyn Pricing   Tikosyn BID  30 Day: $ Not Covered   90 Day: $       Dofetilide BID   30 Day: $0.00, PA required   90 Day: $0.00

## 2020-06-20 NOTE — Telephone Encounter
Informed pt of tikosyn prices. She is agreeable if this medicine is deemed necessary after Afib ablation.

## 2020-06-21 ENCOUNTER — Encounter: Admit: 2020-06-21 | Discharge: 2020-06-21 | Payer: MEDICARE

## 2020-06-22 ENCOUNTER — Encounter: Admit: 2020-06-22 | Discharge: 2020-06-22 | Payer: MEDICARE

## 2020-06-22 DIAGNOSIS — K219 Gastro-esophageal reflux disease without esophagitis: Secondary | ICD-10-CM

## 2020-06-22 DIAGNOSIS — J45909 Unspecified asthma, uncomplicated: Secondary | ICD-10-CM

## 2020-06-22 DIAGNOSIS — G4733 Obstructive sleep apnea (adult) (pediatric): Secondary | ICD-10-CM

## 2020-06-22 DIAGNOSIS — E785 Hyperlipidemia, unspecified: Secondary | ICD-10-CM

## 2020-06-22 DIAGNOSIS — D3A8 Other benign neuroendocrine tumors: Secondary | ICD-10-CM

## 2020-06-22 DIAGNOSIS — I513 Intracardiac thrombosis, not elsewhere classified: Secondary | ICD-10-CM

## 2020-06-22 DIAGNOSIS — N186 End stage renal disease: Secondary | ICD-10-CM

## 2020-06-22 DIAGNOSIS — E039 Hypothyroidism, unspecified: Secondary | ICD-10-CM

## 2020-06-22 DIAGNOSIS — C7A8 Other malignant neuroendocrine tumors: Secondary | ICD-10-CM

## 2020-06-22 DIAGNOSIS — C641 Malignant neoplasm of right kidney, except renal pelvis: Secondary | ICD-10-CM

## 2020-06-22 DIAGNOSIS — I10 Essential (primary) hypertension: Secondary | ICD-10-CM

## 2020-06-22 DIAGNOSIS — M797 Fibromyalgia: Secondary | ICD-10-CM

## 2020-06-22 DIAGNOSIS — I4891 Unspecified atrial fibrillation: Secondary | ICD-10-CM

## 2020-06-22 DIAGNOSIS — K289 Gastrojejunal ulcer, unspecified as acute or chronic, without hemorrhage or perforation: Secondary | ICD-10-CM

## 2020-06-22 DIAGNOSIS — R7303 Prediabetes: Secondary | ICD-10-CM

## 2020-06-22 DIAGNOSIS — M26609 Unspecified temporomandibular joint disorder, unspecified side: Secondary | ICD-10-CM

## 2020-06-22 DIAGNOSIS — H269 Unspecified cataract: Secondary | ICD-10-CM

## 2020-06-22 DIAGNOSIS — I4811 Longstanding persistent atrial fibrillation: Secondary | ICD-10-CM

## 2020-06-22 DIAGNOSIS — C7A098 Malignant carcinoid tumors of other sites: Secondary | ICD-10-CM

## 2020-06-22 DIAGNOSIS — J309 Allergic rhinitis, unspecified: Secondary | ICD-10-CM

## 2020-06-22 DIAGNOSIS — J449 Chronic obstructive pulmonary disease, unspecified: Secondary | ICD-10-CM

## 2020-06-22 LAB — COMPREHENSIVE METABOLIC PANEL
Lab: 0.8 mg/dL (ref 0.3–1.2)
Lab: 0.8 mg/dL (ref 0.4–1.00)
Lab: 107 MMOL/L (ref 98–110)
Lab: 11 mg/dL (ref 7–25)
Lab: 142 MMOL/L — ABNORMAL HIGH (ref 137–147)
Lab: 15 U/L (ref 7–40)
Lab: 26 MMOL/L (ref 21–30)
Lab: 26 U/L (ref 7–56)
Lab: 3.7 MMOL/L (ref 3.5–5.1)
Lab: 4.1 g/dL (ref 3.5–5.0)
Lab: 53 U/L (ref 25–110)
Lab: 6.7 g/dL (ref 6.0–8.0)
Lab: 9 (ref 3–12)
Lab: 9.3 mg/dL (ref 8.5–10.6)
Lab: 96 mg/dL (ref 70–100)
Lab: >60 mL/min (ref >60)
Lab: >60 mL/min (ref >60)

## 2020-06-22 LAB — CBC
Lab: 10 10*3/uL (ref 4.5–11.0)
Lab: 15 g/dL — ABNORMAL HIGH (ref 12.0–15.0)
Lab: 33 g/dL (ref 32.0–36.0)
Lab: 46 % — ABNORMAL HIGH (ref 36–45)
Lab: 5 M/UL — ABNORMAL HIGH (ref 4.0–5.0)
Lab: 90 FL (ref 80–100)

## 2020-06-22 LAB — MAGNESIUM: Lab: 2.2 mg/dL (ref 1.6–2.6)

## 2020-06-22 LAB — POC CREATININE, RAD: Lab: 0.8 mg/dL (ref 0.4–1.00)

## 2020-06-22 LAB — CEA(CARCINOEMBRYONIC AG): Lab: 1.1 ng/mL (ref <3.0)

## 2020-06-22 MED ORDER — RP DX COPPER CU-64 DOTATATE MCI
4 | Freq: Once | INTRAVENOUS | 0 refills | Status: CP
Start: 2020-06-22 — End: ?
  Administered 2020-06-22: 16:00:00 4.6 via INTRAVENOUS

## 2020-06-22 MED ORDER — SODIUM CHLORIDE 0.9 % IJ SOLN
50 mL | Freq: Once | INTRAVENOUS | 0 refills | Status: CP
Start: 2020-06-22 — End: ?
  Administered 2020-06-22: 18:00:00 50 mL via INTRAVENOUS

## 2020-06-22 MED ORDER — IOHEXOL 350 MG IODINE/ML IV SOLN
100 mL | Freq: Once | INTRAVENOUS | 0 refills | Status: CP
Start: 2020-06-22 — End: ?
  Administered 2020-06-22: 18:00:00 100 mL via INTRAVENOUS

## 2020-06-22 NOTE — Progress Notes
Faxed records request as below to PCP.   Also LVM with PCP nurse requesting INR from 7/21 to be faxed to [facility].

## 2020-06-22 NOTE — Progress Notes
Request for the following medical records for purpose of continuity of care:     Please send:  INR from 06/21/20    Please Fax to:  Cardiovascular Medicine - [phone]  [name]  Attention:  Delaney Meigs, RN     Thank you!

## 2020-06-22 NOTE — Progress Notes
Name: Shelby Rogers          MRN: [id number]      DOB: Aug 29, 1951      AGE: 69 y.o.   DATE OF SERVICE: 06/22/2020    Subjective:             Reason for Visit:  Follow Up      Chyan Lage is a 69 y.o. female newly diagnosed with neuroendocrine cancer.    Cancer Staging  No matching staging information was found for the patient.    History of Present Illness  Ms.Favreau is a 69 y.o. 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 She is status post right robotic partial nephrectomy with negative margins 04/25/15. Surveillance CT scans were completed in 2017 which incidentally found a subcentimeter nodule on the head of the pancreas. She had an EUS 09/25/18 revealed a 7 mm round hypoechoic lesion. This was surrounded by blood vessels. FNA was completed which revealed well-differentiated neuroendocrine tumor. MRI 10/15/18 did not reveal a pancreatic lesion. She had a CT 08/23/19 that revealed a stable, small, arterial phase hyperenhancing nodule within the pancreatic head. The size was stable compared to previous imaging. An EUS was repeated 12/07/19 which revealed an 8 mm hypoechoic mass on the head of the pancreas. Due to the blood vessels around the lesion it was difficult to perform FNA. CT scan 01/25/20 did not identify a pancreatic lesion. She met with Dr.Kumer 02/03/20, whipple surgery was offered but she declined at this time. She is here today to establish care.       Review of Systems   Constitutional: Negative for activity change, appetite change, chills, fatigue, fever and unexpected weight change.   HENT: Negative for congestion, rhinorrhea and sinus pressure.    Eyes: Negative for visual disturbance.   Respiratory: Negative for cough, shortness of breath and wheezing.    Cardiovascular: Positive for palpitations. Negative for chest pain.   Gastrointestinal: Negative for abdominal distention, abdominal pain, blood in stool, constipation, diarrhea and nausea.   Genitourinary: Negative for dysuria, frequency and urgency.   Musculoskeletal: Negative for arthralgias and myalgias.   Skin: Negative for color change and rash.   Neurological: Negative for weakness and headaches.   Psychiatric/Behavioral: The patient is not nervous/anxious.          Objective:         ? albuterol 0.5% (PROVENTIL; VENTOLIN) 2.5 mg/0.5 mL nebu nebulizer solution Inhale 2.5 mg solution as directed every 6 hours as needed.   ? dilTIAZem HCL (CARDIZEM) 30 mg tablet 120 mg twice daily. Indications: takes 180mg  in PM   ? famotidine (PEPCID) 20 mg tablet Take 20 mg by mouth twice daily.   ? fexofenadine (ALLEGRA) 180 mg tablet Take 180 mg by mouth every 24 hours as needed.   ? fluticasone (FLONASE) 50 mcg/actuation nasal spray Apply 2 Sprays to each nostril as directed daily.   ? levothyroxine (SYNTHROID) 25 mcg tablet Take 25 mcg by mouth every morning.   ? montelukast (SINGULAIR) 10 mg tablet    ? pantoprazole DR (PROTONIX) 40 mg tablet Take one tablet by mouth twice daily. Starting 1 week prior to procedure and for 1 month post procedure.   ? pantoprazole DR (PROTONIX) 40 mg tablet    ? tiotropium bromide (SPIRIVA RESPIMAT) 2.5 mcg/actuation inhaler Inhale 2 puffs by mouth into the lungs daily.   ? valsartan (DIOVAN) 40 mg tablet Take  80 mg by mouth twice daily.   ? warfarin (COUMADIN) 4 mg tablet    ? warfarin (COUMADIN) 5 mg tablet Take 5 mg by mouth daily as needed.   ? zolpidem (AMBIEN) 5 mg tablet Take 2.5 mg by mouth nightly as needed.     Vitals:    06/22/20 1434   BP: 129/80   BP Source: Arm, Left Upper   Patient Position: Sitting   Pulse: 87   Resp: 16   Temp: 37 ?C (98.6 ?F)   TempSrc: Oral   SpO2: 99%   Weight: 108.4 kg (239 lb)   Height: 163.8 cm (64.49)   PainSc: Eight     Body mass index is 40.41 kg/m?Marland Kitchen     Pain Score: Eight  Pain Loc: Abdomen    Fatigue Scale: 0-None    Pain Addressed:  N/A    Patient Evaluated for a Clinical Trial: No treatment clinical trial available for this patient.     [place] Cooperative Oncology Group performance status is 0, Fully active, able to carry on all pre-disease performance without restriction.Marland Kitchen     Physical Exam  Vitals reviewed.   Constitutional:       General: She is not in acute distress.     Appearance: Normal appearance.   HENT:      Head: Normocephalic and atraumatic.      Nose: Nose normal.      Mouth/Throat:      Mouth: Mucous membranes are moist.      Pharynx: Oropharynx is clear.   Eyes:      General: No scleral icterus.     Conjunctiva/sclera: Conjunctivae normal.      Pupils: Pupils are equal, round, and reactive to light.   Cardiovascular:      Rate and Rhythm: Normal rate and regular rhythm.      Pulses: Normal pulses.      Heart sounds: Normal heart sounds. No murmur.   Pulmonary:      Effort: Pulmonary effort is normal. No respiratory distress.      Breath sounds: Normal breath sounds. No wheezing.   Abdominal:      General: Bowel sounds are normal. There is no distension.      Palpations: Abdomen is soft.      Tenderness: There is no abdominal tenderness.   Musculoskeletal:         General: Normal range of motion.      Cervical back: Normal range of motion.   Skin:     General: Skin is warm and dry.   Neurological:      General: No focal deficit present.      Mental Status: She is alert and oriented to person, place, and time.   Psychiatric:         Mood and Affect: Mood normal.         Behavior: Behavior normal.         Thought Content: Thought content normal.         Judgment: Judgment normal.          CBC w diff    Lab Results   Component Value Date/Time    WBC 10.2 06/22/2020 10:52 AM    RBC 5.09 (H) 06/22/2020 10:52 AM    HGB 15.3 (H) 06/22/2020 10:52 AM    HCT 46.0 (H) 06/22/2020 10:52 AM    MCV 90.3 06/22/2020 10:52 AM    MCH 30.0 06/22/2020 10:52 AM    MCHC 33.2 06/22/2020 10:52 AM  RDW 15.8 (H) 06/22/2020 10:52 AM    PLTCT 204 06/22/2020 10:52 AM    MPV 8.1 06/22/2020 10:52 AM    No results found for: NEUT, ANC, LYMA, ALC, MONA, AMC, EOSA, AEC, BASA, ABC     Comprehensive Metabolic Profile    Lab Results   Component Value Date/Time    NA 142 06/22/2020 10:52 AM    K 3.7 06/22/2020 10:52 AM    CL 107 06/22/2020 10:52 AM    CO2 26 06/22/2020 10:52 AM    GAP 9 06/22/2020 10:52 AM    BUN 11 06/22/2020 10:52 AM    CR 0.8 06/22/2020 10:59 AM    CR 0.84 06/22/2020 10:52 AM    GLU 96 06/22/2020 10:52 AM    Lab Results   Component Value Date/Time    CA 9.3 06/22/2020 10:52 AM    ALBUMIN 4.1 06/22/2020 10:52 AM    TOTPROT 6.7 06/22/2020 10:52 AM    ALKPHOS 53 06/22/2020 10:52 AM    AST 15 06/22/2020 10:52 AM    ALT 26 06/22/2020 10:52 AM    TOTBILI 0.8 06/22/2020 10:52 AM    GFR >60 06/22/2020 10:52 AM    GFRAA >60 06/22/2020 10:52 AM             Assessment and Plan:  1- Well-differentiated neuroendocrine cancer  Surveillance CT scans were completed in 2017 which incidentally found a subcentimeter nodule on the head of the pancreas. She had an EUS 09/25/18 revealed a 7 mm round hypoechoic lesion. This was surrounded by blood vessels. FNA was completed which revealed well-differentiated neuroendocrine tumor. MRI 10/15/18 did not reveal a pancreatic lesion. She had a CT 08/23/19 that revealed a stable, small, arterial phase hyperenhancing nodule within the pancreatic head. The size was stable compared to previous imaging. An EUS was repeated 12/07/19 which revealed an 8 mm hypoechoic mass on the head of the pancreas. Due to the blood vessels around the lesion it was difficult to perform FNA. CT scan 01/25/20 did not identify a pancreatic lesion. She met with Dr.Kumer 02/03/20, whipple surgery was offered but she declined at this time.     Plan:  -Discussed her neuroendocrine cancer appears resectable based on her EUS findings and no metastatic disease present on imaging. She does not want to proceed with surgery at this time.  -Will continue to monitor the size of her pancreatic lesion. Return in 3 months with lab, office visit, and CT scans    06/22/20:   Labs reviewed   Scans (dotatate) reviewed - no evidence of progression or metastatic disease   She is scheduled for cardiac ablation procedure on 07/19/20. Per her cardiologist, she will need to be on anticoagulation for at least 3 months.   Discussed delaying her pancreatic surgery 3 months. RTC in 3 months with labs and follow up scans (dotatate scan)     2- Palpitations  -Follows with cardiology at [facility] in [place]. She has history of atrial fibrillation. Plans to undergo ablation.   Scheduled 07/19/20       The above assessment and recommendations were discussed with the patient/ family today and they are in agreement with the plan.  Their questions and concerns were addressed to the best of their satisfaction. Patient/family has/have the phone numbers for the Cancer Center and was/were instructed on how to contact us with any questions or concerns.

## 2020-06-22 NOTE — Patient Instructions
Dr.Anwaar Saeed,  Medical Oncologist specializing in Gastrointestinal malignancies   Seara Hinesley Barbosa, Nurse Practitioner     Phone: [phone]   Monday-Friday 8:00am- 4:00pm   Fax: [phone]    After Hours Phone: [phone]- On call number for urgent needs outside of business hours ask for the On call Oncology fellow to be paged and they will call you back    Medication refills: please contact your pharmacy for medication refills, if they do not have any refills on file they will contact the office, make sure they have our correct contact information on file   P: [phone], F: [phone]    MyChart Messages: All mychart messages are answered by the nurses, even if you select to send the message to Dr.Saeed or Ransom Nickson. We often communicate with Dr.Saeed or Dino Borntreger regarding how to answer these messages. Messages are answered 8:00am-4:00pm Monday-Friday.    Phone Calls: We are typically not at our desks where our phones are located as we are in clinic. Please leave us a message as we check our messages several times per day. It is our goal to answer these messages as soon as possible. Messages are answered from 8:00am-4:00 pm Monday-Friday. Please make sure you leave your full name with the spelling, date of birth, and reason for your call when leaving a message.

## 2020-06-23 ENCOUNTER — Encounter: Admit: 2020-06-23 | Discharge: 2020-06-23 | Payer: MEDICARE

## 2020-06-23 NOTE — Progress Notes
Called PCP office at [phone]. Her INR was 3.6 on 7/21. They have adjusted dosing. Will route INR reminder to next week.

## 2020-06-28 ENCOUNTER — Encounter: Admit: 2020-06-28 | Discharge: 2020-06-28 | Payer: MEDICARE

## 2020-06-28 NOTE — Progress Notes
Called PCP office at [phone]. Her INR was 2.0 on 7/26. They have adjusted dosing. Will route INR reminder to next week.

## 2020-07-04 ENCOUNTER — Encounter: Admit: 2020-07-04 | Discharge: 2020-07-04 | Payer: MEDICARE

## 2020-07-04 NOTE — Progress Notes
Called PCP office at [phone]. Her INR was 2.0 on 8/2. They have adjusted dosing. Will route INR reminder to next week.?

## 2020-07-11 ENCOUNTER — Encounter: Admit: 2020-07-11 | Discharge: 2020-07-11 | Payer: MEDICARE

## 2020-07-11 NOTE — Progress Notes
Medicare Primary No pre-certification is required.

## 2020-07-11 NOTE — Progress Notes
Called PCP office [phone], left message with details and requesting for them to fax INR result or call EP voicemail and leave message.

## 2020-07-12 ENCOUNTER — Encounter: Admit: 2020-07-12 | Discharge: 2020-07-12 | Payer: MEDICARE

## 2020-07-12 ENCOUNTER — Ambulatory Visit: Admit: 2020-07-12 | Discharge: 2020-07-12 | Payer: MEDICARE

## 2020-07-12 DIAGNOSIS — J449 Chronic obstructive pulmonary disease, unspecified: Secondary | ICD-10-CM

## 2020-07-12 DIAGNOSIS — H269 Unspecified cataract: Secondary | ICD-10-CM

## 2020-07-12 DIAGNOSIS — G4733 Obstructive sleep apnea (adult) (pediatric): Secondary | ICD-10-CM

## 2020-07-12 DIAGNOSIS — I4811 Longstanding persistent atrial fibrillation: Secondary | ICD-10-CM

## 2020-07-12 DIAGNOSIS — Z8719 Personal history of other diseases of the digestive system: Secondary | ICD-10-CM

## 2020-07-12 DIAGNOSIS — K219 Gastro-esophageal reflux disease without esophagitis: Secondary | ICD-10-CM

## 2020-07-12 DIAGNOSIS — E785 Hyperlipidemia, unspecified: Secondary | ICD-10-CM

## 2020-07-12 DIAGNOSIS — J309 Allergic rhinitis, unspecified: Secondary | ICD-10-CM

## 2020-07-12 DIAGNOSIS — N186 End stage renal disease: Secondary | ICD-10-CM

## 2020-07-12 DIAGNOSIS — J45909 Unspecified asthma, uncomplicated: Secondary | ICD-10-CM

## 2020-07-12 DIAGNOSIS — I513 Intracardiac thrombosis, not elsewhere classified: Secondary | ICD-10-CM

## 2020-07-12 DIAGNOSIS — R7303 Prediabetes: Secondary | ICD-10-CM

## 2020-07-12 DIAGNOSIS — D3A8 Other benign neuroendocrine tumors: Secondary | ICD-10-CM

## 2020-07-12 DIAGNOSIS — I4891 Unspecified atrial fibrillation: Secondary | ICD-10-CM

## 2020-07-12 DIAGNOSIS — M26609 Unspecified temporomandibular joint disorder, unspecified side: Secondary | ICD-10-CM

## 2020-07-12 DIAGNOSIS — K289 Gastrojejunal ulcer, unspecified as acute or chronic, without hemorrhage or perforation: Secondary | ICD-10-CM

## 2020-07-12 DIAGNOSIS — M1990 Unspecified osteoarthritis, unspecified site: Secondary | ICD-10-CM

## 2020-07-12 DIAGNOSIS — K8689 Other specified diseases of pancreas: Secondary | ICD-10-CM

## 2020-07-12 DIAGNOSIS — M797 Fibromyalgia: Secondary | ICD-10-CM

## 2020-07-12 DIAGNOSIS — R079 Chest pain, unspecified: Secondary | ICD-10-CM

## 2020-07-12 DIAGNOSIS — C7A8 Other malignant neuroendocrine tumors: Secondary | ICD-10-CM

## 2020-07-12 DIAGNOSIS — E039 Hypothyroidism, unspecified: Secondary | ICD-10-CM

## 2020-07-12 DIAGNOSIS — I10 Essential (primary) hypertension: Secondary | ICD-10-CM

## 2020-07-12 LAB — CBC AND DIFF
Lab: 0 10*3/uL (ref 0–0.20)
Lab: 0.1 10*3/uL (ref 0–0.45)
Lab: 1 % (ref 0–2)
Lab: 1 % (ref 0–5)
Lab: 1 10*3/uL — ABNORMAL HIGH (ref 0–0.80)
Lab: 10 % (ref 4–12)
Lab: 10 K/UL (ref 4.5–11.0)
Lab: 15 g/dL — ABNORMAL HIGH (ref 12.0–15.0)
Lab: 16 % — ABNORMAL HIGH (ref 11–15)
Lab: 188 10*3/uL (ref >60)
Lab: 2 10*3/uL (ref 1.0–4.8)
Lab: 20 % — ABNORMAL LOW (ref 24–44)
Lab: 30 pg (ref 26–34)
Lab: 34 g/dL (ref 32.0–36.0)
Lab: 44 % (ref 36–45)
Lab: 5 M/UL — ABNORMAL HIGH (ref 4.0–5.0)
Lab: 68 % (ref 41–77)
Lab: 7 10*3/uL — ABNORMAL HIGH (ref 1.8–7.0)
Lab: 8.5 FL (ref >60)
Lab: 88 FL (ref 80–100)

## 2020-07-12 LAB — BASIC METABOLIC PANEL: Lab: 142 MMOL/L (ref 137–147)

## 2020-07-12 MED ORDER — SODIUM CHLORIDE 0.9 % IJ SOLN
100 mL | Freq: Once | INTRAVENOUS | 0 refills | Status: CP
Start: 2020-07-12 — End: ?
  Administered 2020-07-12: 18:00:00 100 mL via INTRAVENOUS

## 2020-07-12 MED ORDER — IOPAMIDOL 76 % IV SOLN
100 mL | Freq: Once | INTRAVENOUS | 0 refills | Status: CP
Start: 2020-07-12 — End: ?
  Administered 2020-07-12: 18:00:00 100 mL via INTRAVENOUS

## 2020-07-12 NOTE — Progress Notes
INR 2.3 - Managed by PCP. LAAA with [facility] on 8/18.

## 2020-07-12 NOTE — Progress Notes
Instructions reviewed with patient.  Answered all patient questions.

## 2020-07-14 ENCOUNTER — Encounter: Admit: 2020-07-14 | Discharge: 2020-07-14 | Payer: MEDICARE

## 2020-07-14 MED ORDER — TEMAZEPAM 15 MG PO CAP
15 mg | Freq: Every evening | ORAL | 0 refills | PRN
Start: 2020-07-14 — End: ?

## 2020-07-14 MED ORDER — ALUMINUM-MAGNESIUM HYDROXIDE 200-200 MG/5 ML PO SUSP
30 mL | ORAL | 0 refills | PRN
Start: 2020-07-14 — End: ?

## 2020-07-14 MED ORDER — ACETAMINOPHEN 325 MG PO TAB
650 mg | ORAL | 0 refills | PRN
Start: 2020-07-14 — End: ?
  Administered 2020-07-19 – 2020-07-20 (×2): 650 mg via ORAL

## 2020-07-14 MED ORDER — MAGNESIUM HYDROXIDE 2,400 MG/10 ML PO SUSP
10 mL | ORAL | 0 refills | PRN
Start: 2020-07-14 — End: ?

## 2020-07-17 ENCOUNTER — Encounter: Admit: 2020-07-17 | Discharge: 2020-07-17 | Payer: MEDICARE

## 2020-07-17 MED ORDER — POTASSIUM CHLORIDE 20 MEQ PO TBTQ
20 meq | ORAL_TABLET | Freq: Every day | ORAL | 3 refills | 30.00000 days | Status: AC
Start: 2020-07-17 — End: ?

## 2020-07-17 NOTE — Telephone Encounter
Attempted to contact patient, but no answer. LMCB.

## 2020-07-17 NOTE — Progress Notes
STAT     Request for the following medical records, for the purpose Continuity of Care.     Please send the following:     ? Most Recent INR (Today)     Please Fax to:   FAX#: [phone]  Attn: Cordelia Pen

## 2020-07-17 NOTE — Progress Notes
Pt scheduled for LAAA 8/18 with [facility].  INR today 2.5, managed by PCP.    Pt states she takes 5mg  on M, F, 4mg  the rest of the week.  Per [facility], continue warfarin without interruption.      Reviewed plan with the patient. Patient verbalized understanding and does not have any further questions or concerns. No further education requested from patient. Patient has our contact information for future needs.

## 2020-07-17 NOTE — Telephone Encounter
Returned phone call to pt.  Pt states she had INR drawn at PCP office this morning. Will await result.    Also reviewed pre-procedure labs and K of 3.6, with [facility] recs to supplement.  Pt verbalized understanding and reported RX was delivered this morning.    Reviewed plan with the patient. Patient verbalized understanding and does not have any further questions or concerns. No further education requested from patient. Patient has our contact information for future needs.       ---- Golda Acre, LPN  P Cvm Nurse Ep 07/17/20 12:13pm ----  [facility]- RC about labs. Call her at #[phone].

## 2020-07-17 NOTE — Telephone Encounter
-----   Message from Janalyn Rouse, MD sent at 07/13/2020 10:38 PM CDT -----  Recommend starting 20 mEq daily of potassium leading up to the procedure.  May not necessarily require this long-term though.  We will see. Thx.  ----- Message -----  From: Janett Labella, RN  Sent: 07/12/2020   4:38 PM CDT  To: Janalyn Rouse, MD    Please review pre-procedure labs. K+ 3.6. not currently on a supplement. Do you want to start one prior to ablation on 8/18?

## 2020-07-18 ENCOUNTER — Ambulatory Visit: Admit: 2020-07-18 | Discharge: 2020-07-19 | Payer: MEDICARE

## 2020-07-18 ENCOUNTER — Encounter: Admit: 2020-07-18 | Discharge: 2020-07-18 | Payer: MEDICARE

## 2020-07-18 DIAGNOSIS — I513 Intracardiac thrombosis, not elsewhere classified: Secondary | ICD-10-CM

## 2020-07-18 DIAGNOSIS — C7A8 Other malignant neuroendocrine tumors: Secondary | ICD-10-CM

## 2020-07-18 DIAGNOSIS — N186 End stage renal disease: Secondary | ICD-10-CM

## 2020-07-18 DIAGNOSIS — E039 Hypothyroidism, unspecified: Secondary | ICD-10-CM

## 2020-07-18 DIAGNOSIS — I10 Essential (primary) hypertension: Secondary | ICD-10-CM

## 2020-07-18 DIAGNOSIS — G4733 Obstructive sleep apnea (adult) (pediatric): Secondary | ICD-10-CM

## 2020-07-18 DIAGNOSIS — Z7901 Long term (current) use of anticoagulants: Secondary | ICD-10-CM

## 2020-07-18 DIAGNOSIS — R7303 Prediabetes: Secondary | ICD-10-CM

## 2020-07-18 DIAGNOSIS — H269 Unspecified cataract: Secondary | ICD-10-CM

## 2020-07-18 DIAGNOSIS — K289 Gastrojejunal ulcer, unspecified as acute or chronic, without hemorrhage or perforation: Secondary | ICD-10-CM

## 2020-07-18 DIAGNOSIS — R079 Chest pain, unspecified: Secondary | ICD-10-CM

## 2020-07-18 DIAGNOSIS — E785 Hyperlipidemia, unspecified: Secondary | ICD-10-CM

## 2020-07-18 DIAGNOSIS — I4891 Unspecified atrial fibrillation: Secondary | ICD-10-CM

## 2020-07-18 DIAGNOSIS — M1990 Unspecified osteoarthritis, unspecified site: Secondary | ICD-10-CM

## 2020-07-18 DIAGNOSIS — K219 Gastro-esophageal reflux disease without esophagitis: Secondary | ICD-10-CM

## 2020-07-18 DIAGNOSIS — J309 Allergic rhinitis, unspecified: Secondary | ICD-10-CM

## 2020-07-18 DIAGNOSIS — M26609 Unspecified temporomandibular joint disorder, unspecified side: Secondary | ICD-10-CM

## 2020-07-18 DIAGNOSIS — J449 Chronic obstructive pulmonary disease, unspecified: Secondary | ICD-10-CM

## 2020-07-18 DIAGNOSIS — M797 Fibromyalgia: Secondary | ICD-10-CM

## 2020-07-18 DIAGNOSIS — K8689 Other specified diseases of pancreas: Secondary | ICD-10-CM

## 2020-07-18 DIAGNOSIS — I4811 Longstanding persistent atrial fibrillation: Secondary | ICD-10-CM

## 2020-07-18 DIAGNOSIS — Z8719 Personal history of other diseases of the digestive system: Secondary | ICD-10-CM

## 2020-07-18 DIAGNOSIS — J45909 Unspecified asthma, uncomplicated: Secondary | ICD-10-CM

## 2020-07-19 ENCOUNTER — Encounter: Admit: 2020-07-19 | Discharge: 2020-07-19 | Payer: MEDICARE

## 2020-07-19 DIAGNOSIS — J309 Allergic rhinitis, unspecified: Secondary | ICD-10-CM

## 2020-07-19 DIAGNOSIS — H269 Unspecified cataract: Secondary | ICD-10-CM

## 2020-07-19 DIAGNOSIS — C7A8 Other malignant neuroendocrine tumors: Secondary | ICD-10-CM

## 2020-07-19 DIAGNOSIS — M1990 Unspecified osteoarthritis, unspecified site: Secondary | ICD-10-CM

## 2020-07-19 DIAGNOSIS — I4891 Unspecified atrial fibrillation: Secondary | ICD-10-CM

## 2020-07-19 DIAGNOSIS — Z8719 Personal history of other diseases of the digestive system: Secondary | ICD-10-CM

## 2020-07-19 DIAGNOSIS — M26609 Unspecified temporomandibular joint disorder, unspecified side: Secondary | ICD-10-CM

## 2020-07-19 DIAGNOSIS — J449 Chronic obstructive pulmonary disease, unspecified: Secondary | ICD-10-CM

## 2020-07-19 DIAGNOSIS — G4733 Obstructive sleep apnea (adult) (pediatric): Secondary | ICD-10-CM

## 2020-07-19 DIAGNOSIS — I10 Essential (primary) hypertension: Secondary | ICD-10-CM

## 2020-07-19 DIAGNOSIS — E039 Hypothyroidism, unspecified: Secondary | ICD-10-CM

## 2020-07-19 DIAGNOSIS — R079 Chest pain, unspecified: Secondary | ICD-10-CM

## 2020-07-19 DIAGNOSIS — K219 Gastro-esophageal reflux disease without esophagitis: Secondary | ICD-10-CM

## 2020-07-19 DIAGNOSIS — M797 Fibromyalgia: Secondary | ICD-10-CM

## 2020-07-19 DIAGNOSIS — K289 Gastrojejunal ulcer, unspecified as acute or chronic, without hemorrhage or perforation: Secondary | ICD-10-CM

## 2020-07-19 DIAGNOSIS — K8689 Other specified diseases of pancreas: Secondary | ICD-10-CM

## 2020-07-19 DIAGNOSIS — E785 Hyperlipidemia, unspecified: Secondary | ICD-10-CM

## 2020-07-19 DIAGNOSIS — I513 Intracardiac thrombosis, not elsewhere classified: Secondary | ICD-10-CM

## 2020-07-19 DIAGNOSIS — R7303 Prediabetes: Secondary | ICD-10-CM

## 2020-07-19 DIAGNOSIS — N186 End stage renal disease: Secondary | ICD-10-CM

## 2020-07-19 DIAGNOSIS — J45909 Unspecified asthma, uncomplicated: Secondary | ICD-10-CM

## 2020-07-19 MED ORDER — ARTIFICIAL TEARS SINGLE DOSE DROPS GROUP
OPHTHALMIC | 0 refills | Status: DC
Start: 2020-07-19 — End: 2020-07-19
  Administered 2020-07-19: 13:00:00 2 [drp] via OPHTHALMIC

## 2020-07-19 MED ORDER — LIDOCAINE (PF) 20 MG/ML (2 %) IJ SOLN
INTRAVENOUS | 0 refills | Status: DC
Start: 2020-07-19 — End: 2020-07-19
  Administered 2020-07-19: 13:00:00 100 mg via INTRAVENOUS

## 2020-07-19 MED ORDER — SUCCINYLCHOLINE CHLORIDE 20 MG/ML IJ SOLN
INTRAVENOUS | 0 refills | Status: DC
Start: 2020-07-19 — End: 2020-07-19

## 2020-07-19 MED ORDER — DEXAMETHASONE SODIUM PHOSPHATE 4 MG/ML IJ SOLN
INTRAVENOUS | 0 refills | Status: DC
Start: 2020-07-19 — End: 2020-07-19
  Administered 2020-07-19: 13:00:00 4 mg via INTRAVENOUS

## 2020-07-19 MED ORDER — FENTANYL CITRATE (PF) 50 MCG/ML IJ SOLN
INTRAVENOUS | 0 refills | Status: DC
Start: 2020-07-19 — End: 2020-07-19
  Administered 2020-07-19: 16:00:00 50 ug via INTRAVENOUS
  Administered 2020-07-19 (×2): 25 ug via INTRAVENOUS

## 2020-07-19 MED ORDER — PROPOFOL INJ 10 MG/ML IV VIAL
INTRAVENOUS | 0 refills | Status: DC
Start: 2020-07-19 — End: 2020-07-19
  Administered 2020-07-19: 13:00:00 50 mg via INTRAVENOUS

## 2020-07-19 MED ORDER — PHENYLEPHRINE HCL IN 0.9% NACL 1 MG/10 ML (100 MCG/ML) IV SYRG
INTRAVENOUS | 0 refills | Status: DC
Start: 2020-07-19 — End: 2020-07-19
  Administered 2020-07-19 (×2): 100 ug via INTRAVENOUS
  Administered 2020-07-19: 13:00:00 50 ug via INTRAVENOUS

## 2020-07-19 MED ORDER — CEFAZOLIN 1 GRAM IJ SOLR
INTRAVENOUS | 0 refills | Status: DC
Start: 2020-07-19 — End: 2020-07-19
  Administered 2020-07-19: 14:00:00 2 g via INTRAVENOUS

## 2020-07-19 MED ORDER — PHENYLEPHRINE 40 MCG/ML IN NS IV DRIP (STD CONC)
INTRAVENOUS | 0 refills | Status: DC
Start: 2020-07-19 — End: 2020-07-19
  Administered 2020-07-19 (×2): .2 ug/kg/min via INTRAVENOUS

## 2020-07-19 MED ORDER — ONDANSETRON HCL (PF) 4 MG/2 ML IJ SOLN
INTRAVENOUS | 0 refills | Status: DC
Start: 2020-07-19 — End: 2020-07-19
  Administered 2020-07-19 (×2): 4 mg via INTRAVENOUS

## 2020-07-19 MED ADMIN — IOPAMIDOL 61 % 50 ML IV SOLN (OT)(OSM) [211799]: 6 mL | INTRAVENOUS | @ 15:00:00 | Stop: 2020-07-19 | NDC 00270131530

## 2020-07-19 MED ADMIN — HEPARIN (PORCINE) 1,000 UNIT/ML IJ SOLN [10176]: 3000 [IU] | INTRAVENOUS | @ 14:00:00 | Stop: 2020-07-19 | NDC 63323054011

## 2020-07-19 MED ADMIN — HEPARIN (PORCINE) 1,000 UNIT/ML IJ SOLN [10176]: 300 mL | @ 14:00:00 | Stop: 2020-07-19 | NDC 63323054001

## 2020-07-19 MED ADMIN — POTASSIUM CHLORIDE 20 MEQ PO TBTQ [35943]: 20 meq | ORAL | @ 21:00:00 | Stop: 2020-07-19 | NDC 00245531989

## 2020-07-19 MED ADMIN — BUPIVACAINE (PF) 0.25 % (2.5 MG/ML) IJ SOLN [87866]: 18 mL | @ 14:00:00 | Stop: 2020-07-19 | NDC 00409155918

## 2020-07-19 MED ADMIN — SODIUM CHLORIDE 0.9 % IV SOLP [27838]: 400 mL | @ 14:00:00 | Stop: 2020-07-19 | NDC 63323062376

## 2020-07-19 MED ADMIN — SODIUM CHLORIDE 0.9 % IV SOLP [27838]: 300 mL | @ 14:00:00 | Stop: 2020-07-19 | NDC 63323062376

## 2020-07-19 MED ADMIN — HEPARIN (PORCINE) IN 5 % DEX 20,000 UNIT/500 ML (40 UNIT/ML) IV SOLP [3628]: 2500 [IU]/h | INTRAVENOUS | @ 15:00:00 | Stop: 2020-07-19 | NDC 00264956710

## 2020-07-19 MED ADMIN — HEPARIN (PORCINE) 1,000 UNIT/ML IJ SOLN [10176]: 6000 [IU] | INTRAVENOUS | @ 15:00:00 | Stop: 2020-07-19 | NDC 63323054011

## 2020-07-19 MED ADMIN — LIDOCAINE 5 % TP PTMD [80759]: 2 | TOPICAL | @ 21:00:00 | NDC 00591352530

## 2020-07-19 MED ADMIN — SODIUM CHLORIDE 0.9 % IV SOLP [27838]: 1000.000 mL | INTRAVENOUS | @ 12:00:00 | Stop: 2020-07-21 | NDC 00338004904

## 2020-07-19 MED ADMIN — HEPARIN (PORCINE) 1,000 UNIT/ML IJ SOLN [10176]: 150 mL | @ 14:00:00 | Stop: 2020-07-19 | NDC 63323054011

## 2020-07-19 MED ADMIN — PROTAMINE 10 MG/ML IV SOLN [6677]: 30 mg | INTRAVENOUS | @ 18:00:00 | Stop: 2020-07-19 | NDC 63323022905

## 2020-07-19 MED ADMIN — DOFETILIDE 500 MCG PO CAP [82575]: 500 ug | ORAL | @ 22:00:00 | Stop: 2020-07-20 | NDC 47335006386

## 2020-07-19 MED ADMIN — MAGNESIUM SULFATE IN D5W 1 GRAM/100 ML IV PGBK [166578]: 1 g | INTRAVENOUS | @ 21:00:00 | Stop: 2020-07-19 | NDC 00409672723

## 2020-07-19 MED ADMIN — MAGNESIUM SULFATE IN D5W 1 GRAM/100 ML IV PGBK [166578]: 1 g | INTRAVENOUS | @ 23:00:00 | Stop: 2020-07-19 | NDC 00409672723

## 2020-07-19 MED ADMIN — SODIUM CHLORIDE 0.9 % IV SOLP [27838]: 900 mL | @ 14:00:00 | Stop: 2020-07-19 | NDC 63323062376

## 2020-07-19 MED ADMIN — SODIUM CHLORIDE 0.9 % IV SOLP [27838]: 150 mL | @ 14:00:00 | Stop: 2020-07-19 | NDC 00338004903

## 2020-07-19 MED ADMIN — ISOPROTERENOL HCL 0.2 MG/ML IJ SOLN [4034]: 20 ug/min | INTRAVENOUS | @ 17:00:00 | Stop: 2020-07-19 | NDC 14789001507

## 2020-07-19 MED ADMIN — SUCRALFATE 1 GRAM PO TAB [11442]: 1 g | ORAL | @ 21:00:00 | NDC 00093221005

## 2020-07-19 MED ADMIN — HEPARIN (PORCINE) 1,000 UNIT/ML IJ SOLN [10176]: 400 mL | @ 14:00:00 | Stop: 2020-07-19 | NDC 63323054001

## 2020-07-19 MED ADMIN — DEXTROSE 5% IN WATER IV SOLP [2364]: 20 ug/min | INTRAVENOUS | @ 17:00:00 | Stop: 2020-07-19 | NDC 00338001703

## 2020-07-19 MED ADMIN — PROTAMINE 10 MG/ML IV SOLN [6677]: 10 mg | INTRAVENOUS | @ 18:00:00 | Stop: 2020-07-19 | NDC 63323022905

## 2020-07-19 MED ADMIN — HEPARIN (PORCINE) 1,000 UNIT/ML IJ SOLN [10176]: 900 mL | @ 14:00:00 | Stop: 2020-07-19 | NDC 63323054001

## 2020-07-19 MED ADMIN — PANTOPRAZOLE 40 MG PO TBEC [80436]: 40 mg | ORAL | @ 21:00:00 | NDC 00904647461

## 2020-07-20 MED ADMIN — PANTOPRAZOLE 40 MG PO TBEC [80436]: 40 mg | ORAL | @ 15:00:00 | Stop: 2020-07-20 | NDC 00904647461

## 2020-07-20 MED ADMIN — LORATADINE 10 MG PO TAB [10466]: 10 mg | ORAL | @ 15:00:00 | Stop: 2020-07-20 | NDC 00904685261

## 2020-07-20 MED ADMIN — SUCRALFATE 1 GRAM PO TAB [11442]: 1 g | ORAL | @ 15:00:00 | Stop: 2020-07-20 | NDC 00093221005

## 2020-07-20 MED ADMIN — FAMOTIDINE 20 MG PO TAB [10011]: 20 mg | ORAL | @ 15:00:00 | Stop: 2020-07-20 | NDC 63739064510

## 2020-07-20 MED ADMIN — SUCRALFATE 1 GRAM PO TAB [11442]: 1 g | ORAL | @ 02:00:00 | NDC 00093221005

## 2020-07-20 MED ADMIN — MAGNESIUM OXIDE 400 MG (241.3 MG MAGNESIUM) PO TAB [10491]: 400 mg | ORAL | @ 04:00:00 | NDC 10006070028

## 2020-07-20 MED ADMIN — FENTANYL CITRATE (PF) 50 MCG/ML IJ SOLN [3037]: 25 ug | INTRAVENOUS | Stop: 2020-07-20 | NDC 00409909412

## 2020-07-20 MED ADMIN — FLUTICASONE PROPIONATE 50 MCG/ACTUATION NA SPSN [70536]: 2 | NASAL | @ 15:00:00 | Stop: 2020-07-20 | NDC 60505082901

## 2020-07-20 MED ADMIN — FAMOTIDINE 20 MG PO TAB [10011]: 20 mg | ORAL | @ 02:00:00 | NDC 63739064510

## 2020-07-20 MED ADMIN — POTASSIUM CHLORIDE 20 MEQ PO TBTQ [35943]: 20 meq | ORAL | @ 15:00:00 | Stop: 2020-07-20 | NDC 00245531989

## 2020-07-20 MED ADMIN — LEVOTHYROXINE 25 MCG PO TAB [4420]: 25 ug | ORAL | @ 15:00:00 | Stop: 2020-07-20 | NDC 51079044401

## 2020-07-20 MED ADMIN — SENNOSIDES 8.6 MG PO TAB [11349]: 1 | ORAL | @ 15:00:00 | Stop: 2020-07-20 | NDC 00904652261

## 2020-07-20 MED ADMIN — MAGNESIUM OXIDE 400 MG (241.3 MG MAGNESIUM) PO TAB [10491]: 400 mg | ORAL | @ 15:00:00 | Stop: 2020-07-20 | NDC 10006070028

## 2020-07-20 MED ADMIN — TIOTROPIUM BROMIDE 2.5 MCG/ACTUATION IN MIST [322742]: 2 | RESPIRATORY_TRACT | @ 11:00:00 | Stop: 2020-07-20 | NDC 00597010051

## 2020-07-20 MED ADMIN — PANTOPRAZOLE 40 MG PO TBEC [80436]: 40 mg | ORAL | @ 02:00:00 | NDC 00904647461

## 2020-07-20 MED ADMIN — MONTELUKAST 10 MG PO TAB [81627]: 10 mg | ORAL | @ 15:00:00 | Stop: 2020-07-20 | NDC 00904680861

## 2020-07-21 ENCOUNTER — Encounter: Admit: 2020-07-21 | Discharge: 2020-07-21 | Payer: MEDICARE

## 2020-07-21 DIAGNOSIS — K219 Gastro-esophageal reflux disease without esophagitis: Secondary | ICD-10-CM

## 2020-07-21 DIAGNOSIS — J449 Chronic obstructive pulmonary disease, unspecified: Secondary | ICD-10-CM

## 2020-07-21 DIAGNOSIS — J309 Allergic rhinitis, unspecified: Secondary | ICD-10-CM

## 2020-07-21 DIAGNOSIS — Z8719 Personal history of other diseases of the digestive system: Secondary | ICD-10-CM

## 2020-07-21 DIAGNOSIS — E785 Hyperlipidemia, unspecified: Secondary | ICD-10-CM

## 2020-07-21 DIAGNOSIS — I513 Intracardiac thrombosis, not elsewhere classified: Secondary | ICD-10-CM

## 2020-07-21 DIAGNOSIS — M797 Fibromyalgia: Secondary | ICD-10-CM

## 2020-07-21 DIAGNOSIS — H269 Unspecified cataract: Secondary | ICD-10-CM

## 2020-07-21 DIAGNOSIS — J45909 Unspecified asthma, uncomplicated: Secondary | ICD-10-CM

## 2020-07-21 DIAGNOSIS — N186 End stage renal disease: Secondary | ICD-10-CM

## 2020-07-21 DIAGNOSIS — R079 Chest pain, unspecified: Secondary | ICD-10-CM

## 2020-07-21 DIAGNOSIS — M26609 Unspecified temporomandibular joint disorder, unspecified side: Secondary | ICD-10-CM

## 2020-07-21 DIAGNOSIS — E039 Hypothyroidism, unspecified: Secondary | ICD-10-CM

## 2020-07-21 DIAGNOSIS — R7303 Prediabetes: Secondary | ICD-10-CM

## 2020-07-21 DIAGNOSIS — M1990 Unspecified osteoarthritis, unspecified site: Secondary | ICD-10-CM

## 2020-07-21 DIAGNOSIS — K8689 Other specified diseases of pancreas: Secondary | ICD-10-CM

## 2020-07-21 DIAGNOSIS — G4733 Obstructive sleep apnea (adult) (pediatric): Secondary | ICD-10-CM

## 2020-07-21 DIAGNOSIS — I4891 Unspecified atrial fibrillation: Secondary | ICD-10-CM

## 2020-07-21 DIAGNOSIS — I10 Essential (primary) hypertension: Secondary | ICD-10-CM

## 2020-07-21 DIAGNOSIS — C7A8 Other malignant neuroendocrine tumors: Secondary | ICD-10-CM

## 2020-07-21 DIAGNOSIS — K289 Gastrojejunal ulcer, unspecified as acute or chronic, without hemorrhage or perforation: Secondary | ICD-10-CM

## 2020-07-22 ENCOUNTER — Encounter: Admit: 2020-07-22 | Discharge: 2020-07-22 | Payer: MEDICARE

## 2020-07-23 ENCOUNTER — Encounter: Admit: 2020-07-23 | Discharge: 2020-07-23 | Payer: MEDICARE

## 2020-07-24 ENCOUNTER — Encounter: Admit: 2020-07-24 | Discharge: 2020-07-24 | Payer: MEDICARE

## 2020-07-24 IMAGING — DX XR chest 1V portable
1 series · 1 of 1 positions shown · non-contrast
Comparison: none

Procedure(s): XR chest 1V portable

CHEST X-RAY, SINGLE VIEW [DATE]
CLINICAL DATA: Chest pain.

[chest ap]
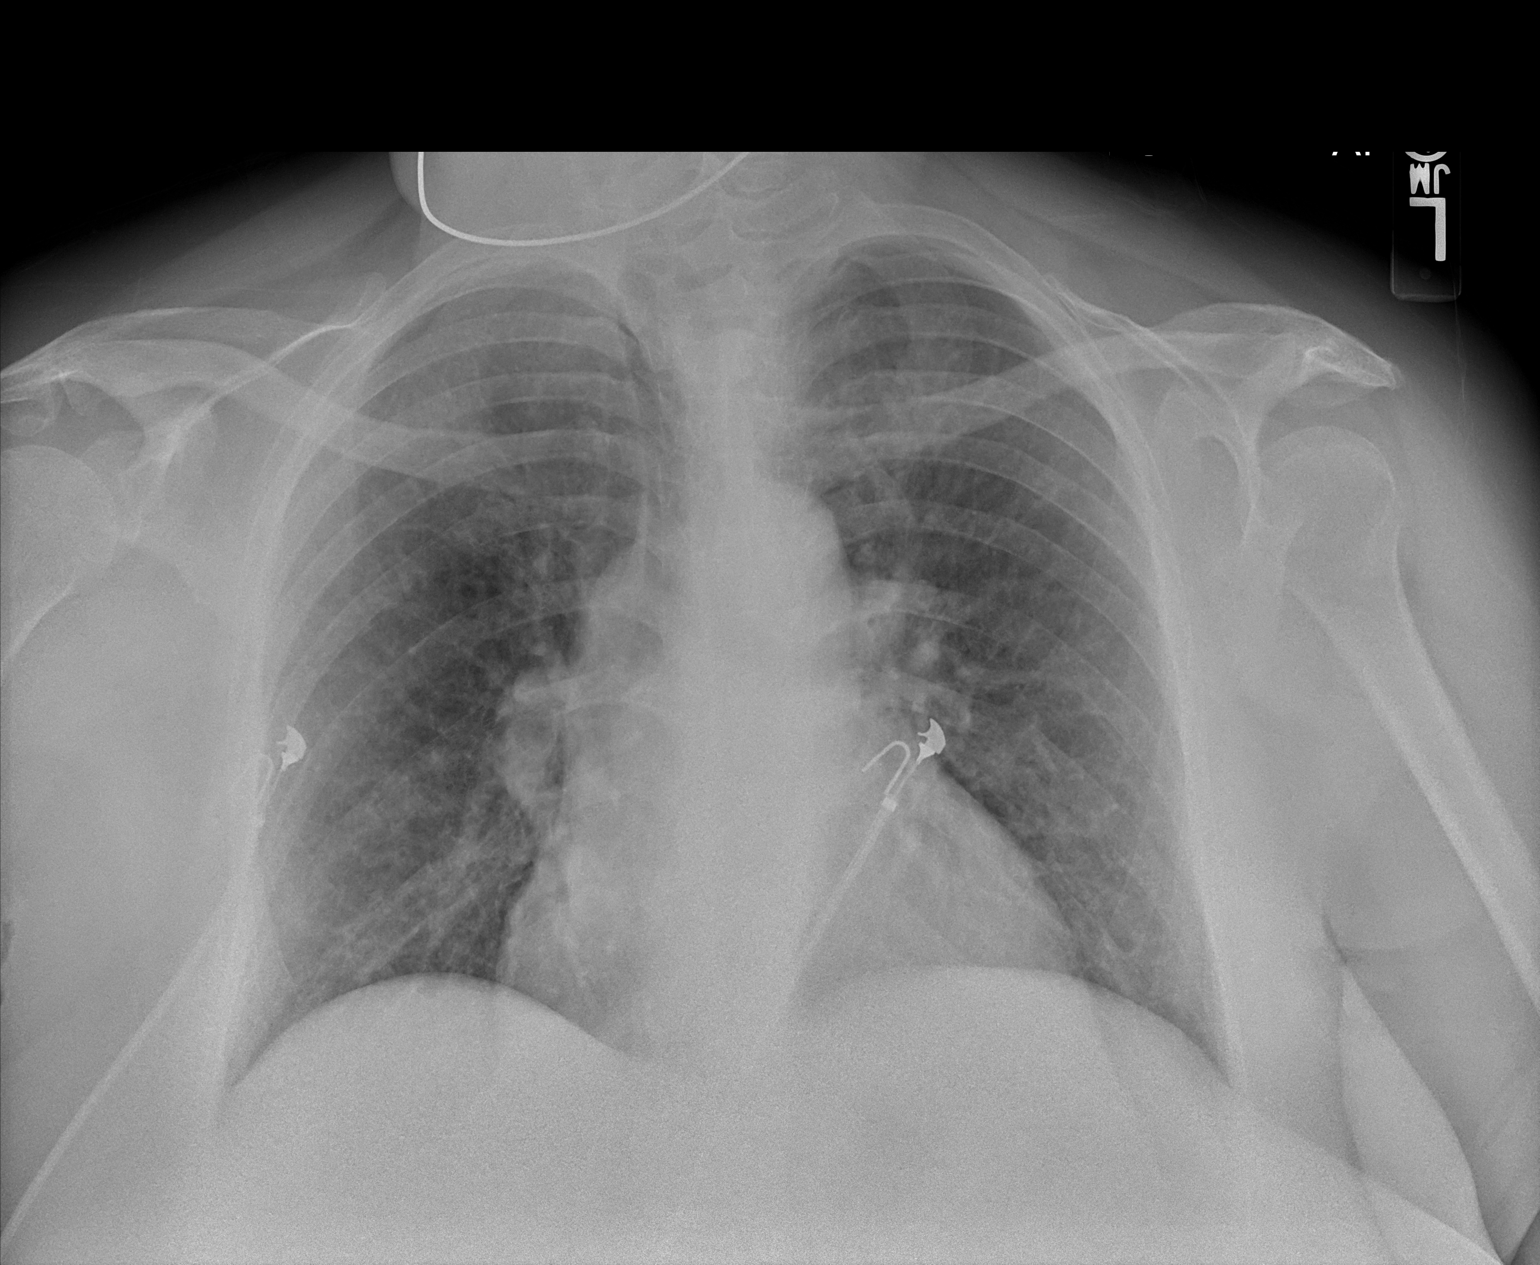

[1 of 1 positions shown; findings below may reference images not displayed]

IMPRESSION: No acute cardiopulmonary process detected. Normal heart
size.

## 2020-07-24 IMAGING — CT AGCHESTPE
2 of 5 series · 12 of 36 positions shown, 15 images · non-contrast
Comparison: None.

Procedure(s): CT angio chest PE protocol

CT ANGIOGRAM OF THE CHEST
CLINICAL HISTORY: Chest pain and shortness of breath.
Previous ablation for atrial fibrillation. Evaluate for
pulmonary embolism.
TECHNIQUE: Axial images were obtained through the chest
with 75 mL Optiray 300 mg intravenous contrast, with
multiplanar 3D reformatted images created on a separate
workstation. Up-to-date CT equipment and dose reduction
techniques were utilized.

[Series 4: aice 2.0 ce · axial · 0.78mm/px · z∈[+1415,+1683]mm · 9 of 168 slices shown, 12 images]
[im 17/168  mediastinal]
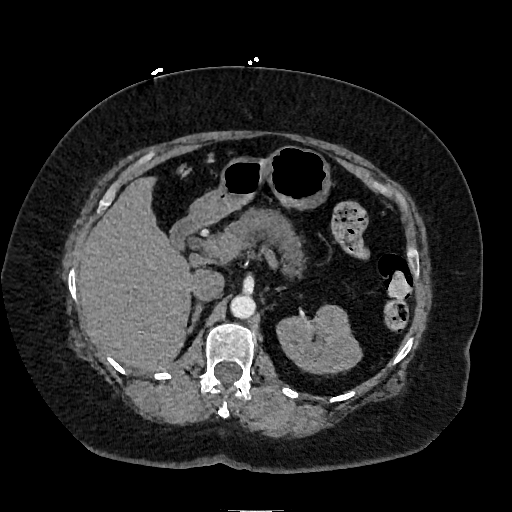
[im 17/168  lung]
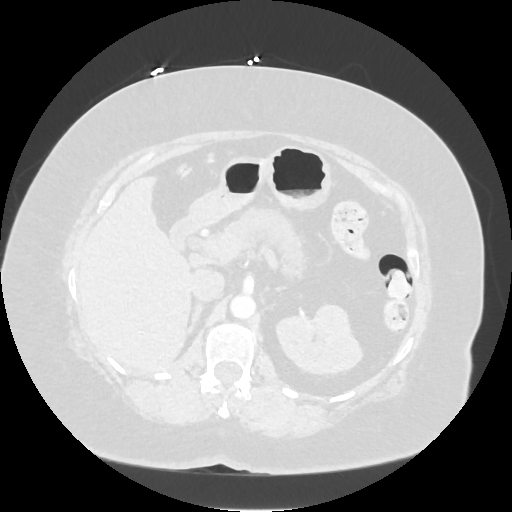
[im 34/168  lung]
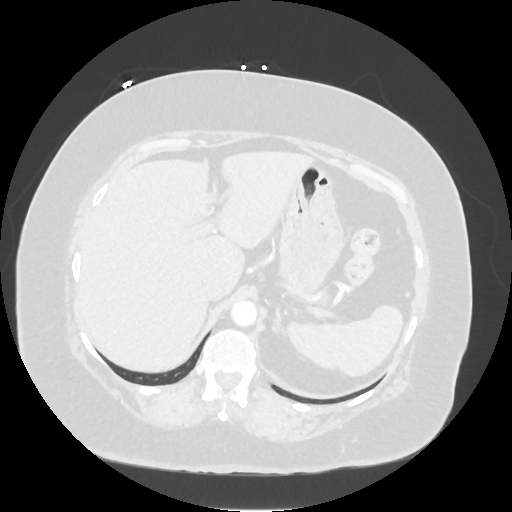
[im 51/168  lung]
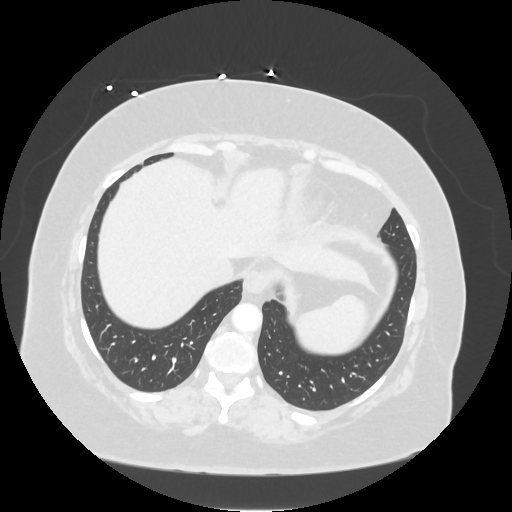
[im 67/168  lung]
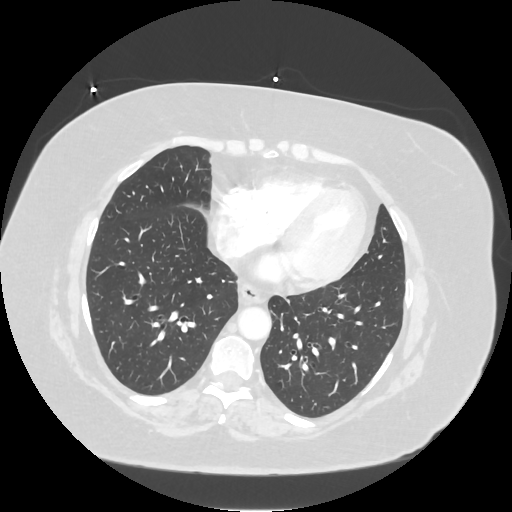
[im 84/168  mediastinal]
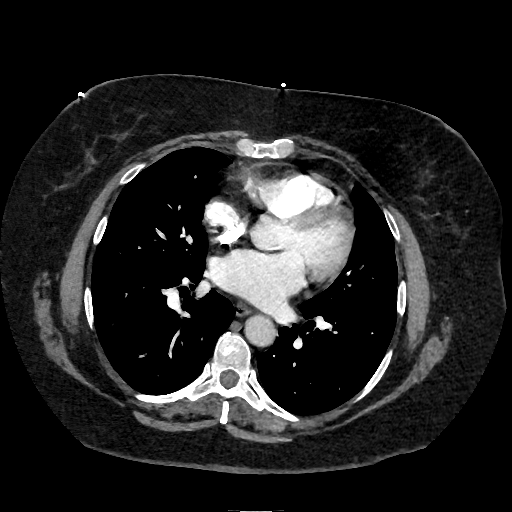
[im 84/168  lung]
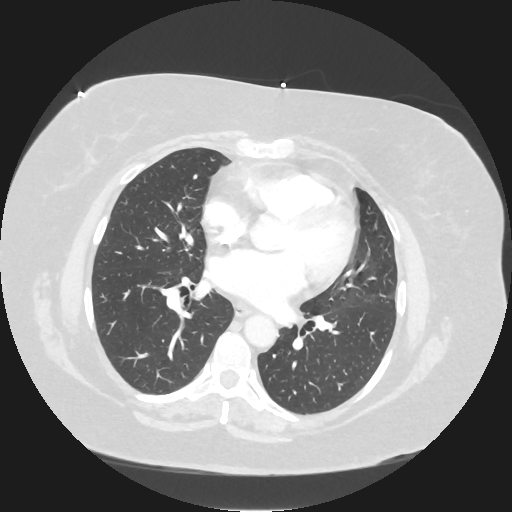
[im 101/168  lung]
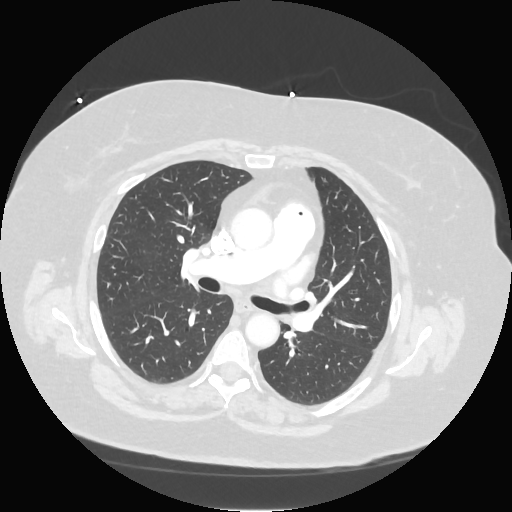
[im 117/168  lung]
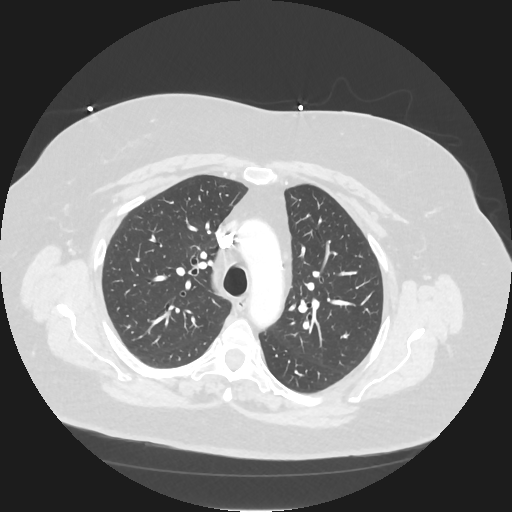
[im 134/168  lung]
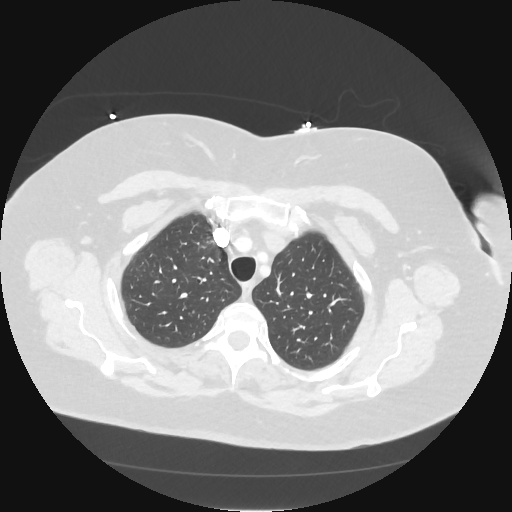
[im 151/168  mediastinal]
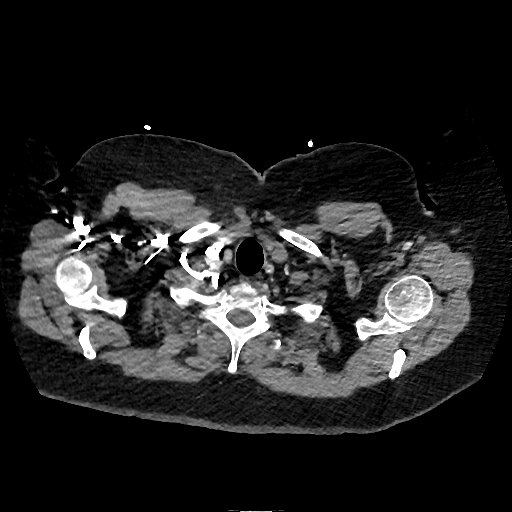
[im 151/168  lung]
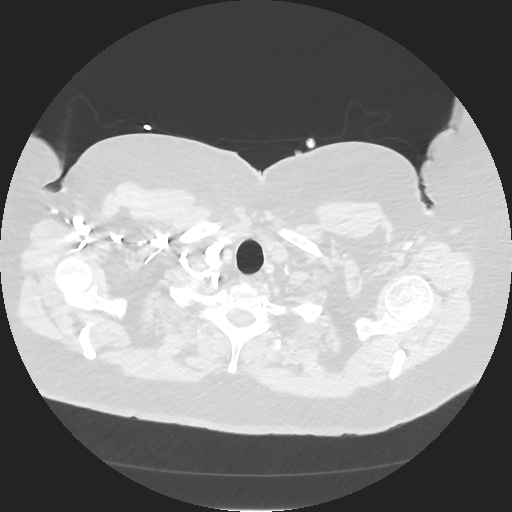

[Series 15: aice 10.000 ce · coronal · 0.78mm/px · 3 of 102 slices shown]
[im 21/102  lung]
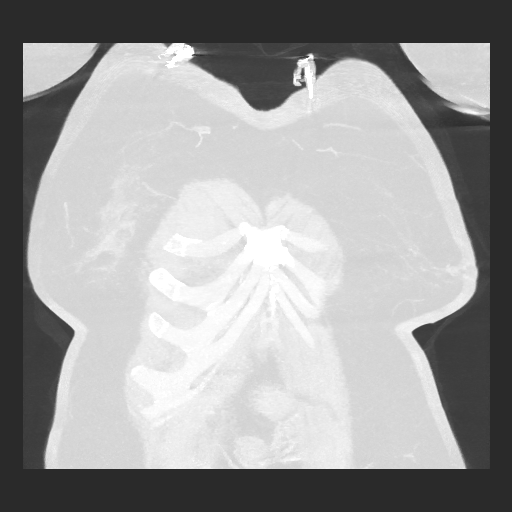
[im 41/102  lung]
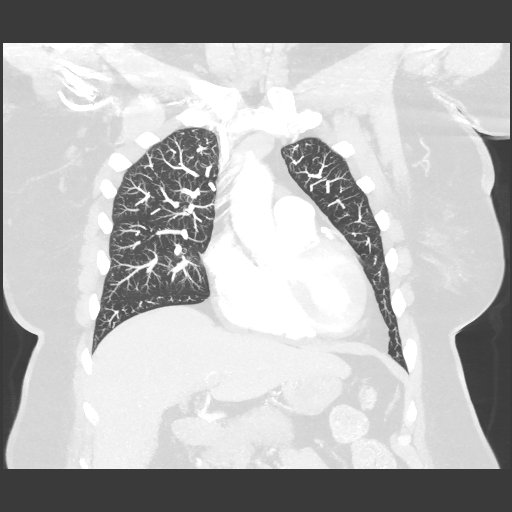
[im 61/102  lung]
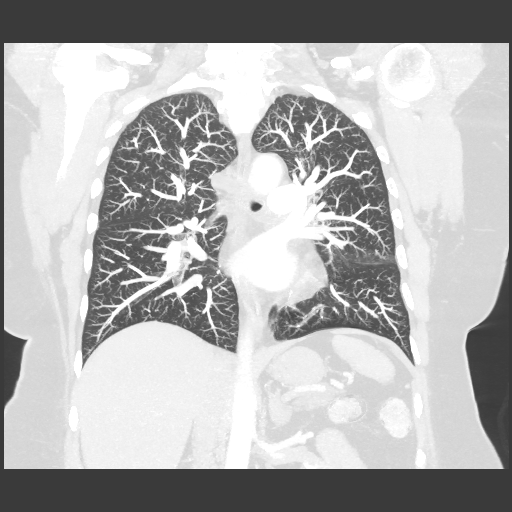

[12 of 36 positions shown; findings below may reference images not displayed]

CT CHEST FINDINGS:
Lungs and pleural space: The lungs are clear.

Lymph nodes and mediastinum: No significant thoracic
adenopathy.

Heart and pericardium: Mild coronary artery calcifications.

Bones and chest wall: No significant bone abnormalities.

Upper abdomen: Limited evaluation of the upper abdomen
demonstrates no significant abnormality.

CT ANGIOGRAM FINDINGS:
There is excellent opacification of the pulmonary
vascularity. No filling defects are seen to suggest
pulmonary embolism.
IMPRESSION: Normal CT angiogram of the chest. No pulmonary embolism. No
focal pulmonary infiltrate.

## 2020-07-24 NOTE — Telephone Encounter
I called pt. She reports she went to the ED at [facility] and has been admitted to their observation unit. She states she was having CP, SOA, and tightness that wouldn't let up. She states the CP started radiating to her back. She reports getting progressively worse since Saturday. I assured pt she is in the right place for close monitoring. Pt will call with any questions / concerns.    Reviewed plan with the patient. Patient verbalized understanding and does not have any further questions or concerns. No further education requested from patient. Patient has our contact information for future needs.

## 2020-07-24 NOTE — Progress Notes
Dr Betti Cruz calls the [facility]; conferenced with Dr Bosie Clos:     R parasternal CP; tighness and SOA today  EKG neg; VSS  Flecanide 75mg  BID   CP w/u is neg; trop is 0.1 (not increasing)  D dimer + but PE w/u neg  BNP is slightly elevated; covid neg  CXR clear     Per Dr Betti Cruz:   May be a delayed pericarditis  May need low dose colchicine for 1 week for severe pain  Delayed pericardial effusion can be a culprit but sounds like CT neg; may need an echo  inspirational  pain is classical pericardial pain

## 2020-07-24 NOTE — Telephone Encounter
-----   Message from Houston Meerdink sent at 07/24/2020 11:28 AM CDT -----  Regarding: [facility]-Patient in ED at [facility] and EKG in [place] for Review.   I put labs in O2.   Thanks, [employer]

## 2020-07-25 ENCOUNTER — Encounter: Admit: 2020-07-25 | Discharge: 2020-07-25 | Payer: MEDICARE

## 2020-07-25 IMAGING — DX XR chest 1V portable
1 series · 1 of 1 positions shown · non-contrast
Comparison: none

Procedure(s): XR chest 1V portable

PORTABLE CHEST X-RAY DATED 07/25/2020 AT 3703 HOURS:
INDICATION: Chest Pain

[chest ap]
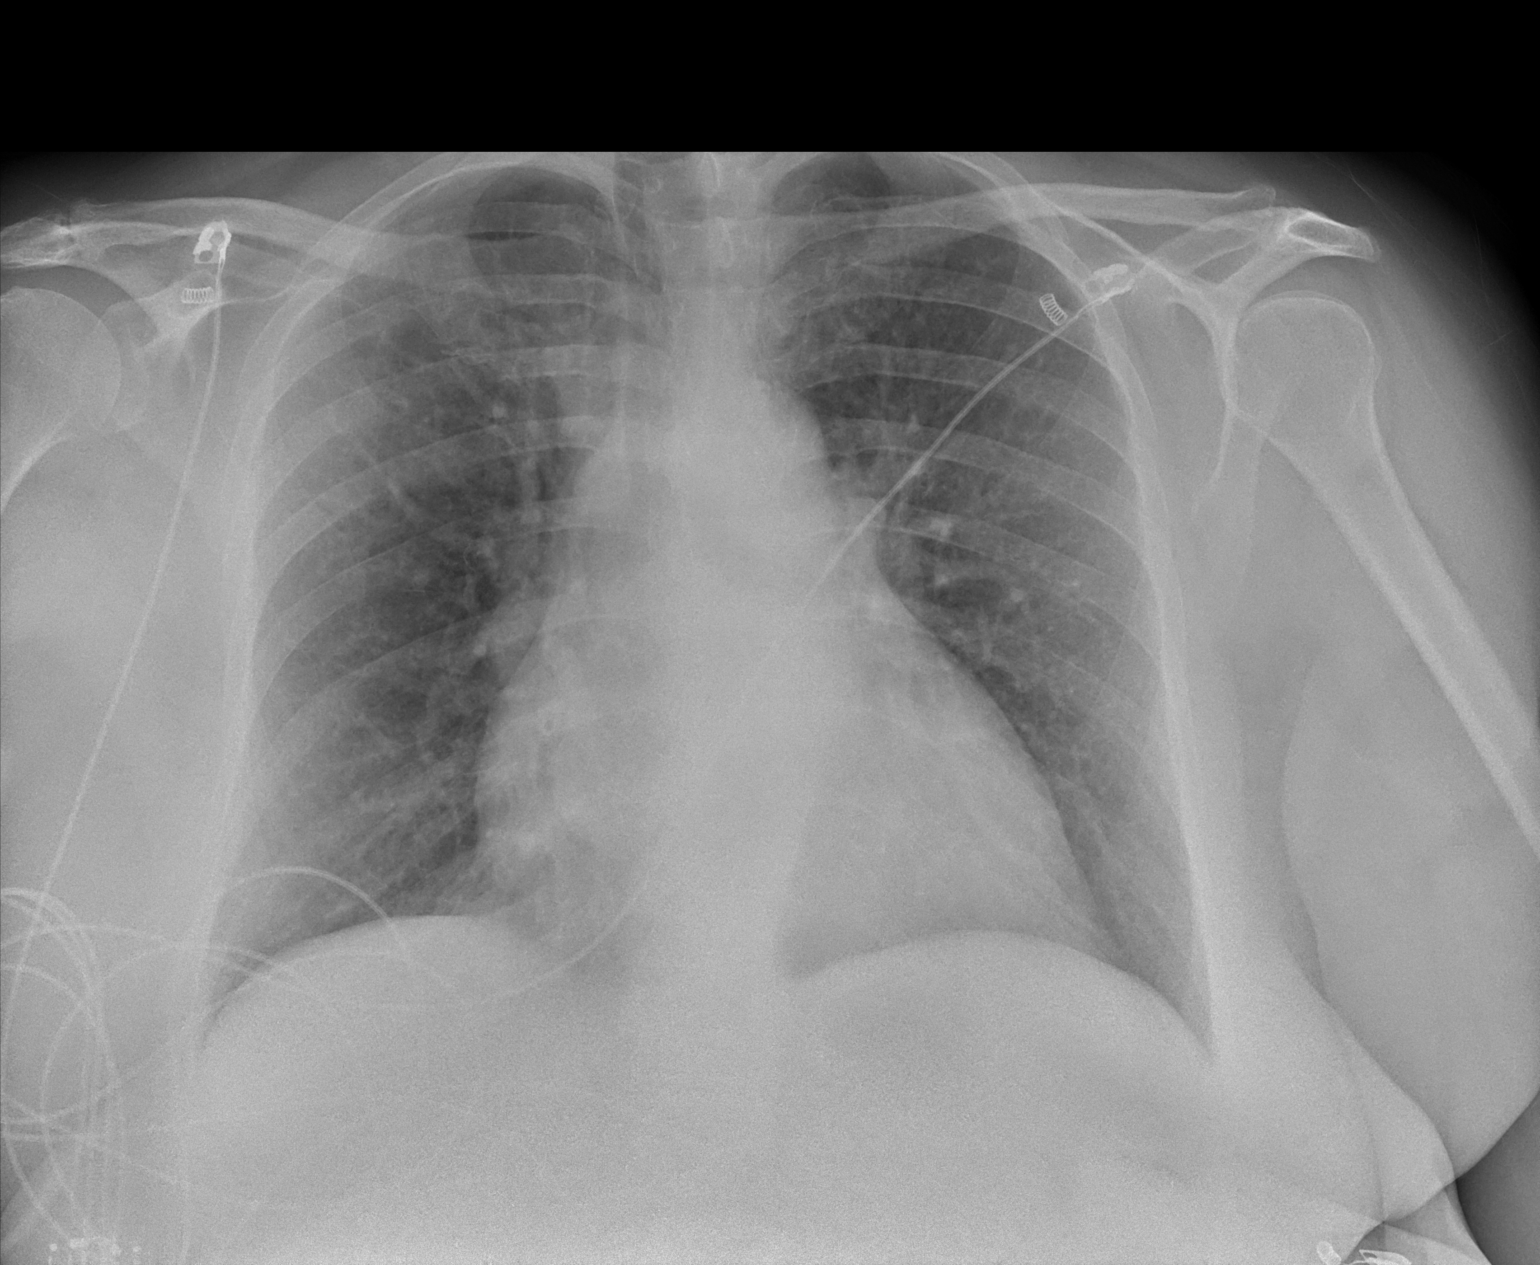

[1 of 1 positions shown; findings below may reference images not displayed]

COMPARISON STUDIES: Chest x-ray 07/25/2019. CTA chest
07/24/2020.

TECHNIQUE/FINDINGS:

Heart size upper limits of normal as seen previously. The
pulmonary vascularity is within normal limits. The
mediastinal contour is within normal limits. Scattered
calcified granulomas. No consolidation or effusion. No
pneumothorax. The bony structures are intact.
IMPRESSION: Heart size upper limits of normal. No acute infiltrate or
effusion.

## 2020-07-27 IMAGING — US CA echo doppler complete
2 series · 13 of 16 positions shown · non-contrast
Comparison: none

Procedure(s): CA echo doppler complete

Echocardiogram Report
Indications: Atrial fibrillation, status post ablation
Requesting provider: Marko
Technical aspects: Adequate, patient in normal sinus rhythm during performance of procedure
M-mode (the following are in centimeters): Left ventricular end-diastolic 5.4, left ventricular end
systolic 3.3, interventricular septal width 0.7, left ventricular posterior width 0.9, left atrium
4.9, aortic root
2d and Doppler
Left ventricle  : Normal left ventricular systolic function EF of 60% with no evidence of regional
wall motion abnormality
Mitral valve  : No evidence of prolapse, stenosis, mild-to-moderate mitral valve regurgitation
Left Atrium  : Mildly to moderately dilated
Aortic valve  : Trileaflet with evidence of sclerosis no evidence of stenosis or insufficiency
Aortic Root  : Normal size
Right Ventricle : Normal size minimal function
Tricuspid valve : Mild tricuspid regurgitation with estimated pulmonary artery systolic pressures
were 25-30 mmHg (estimating central venous pressure 5-10 mmHg)
Right atrium : Normal size
Pericardium : No evidence of pericardial disease
Inferior vena cava : Not well seen

[Series 1: ca echo doppler complete · 24 acquisitions, 11 frames shown (1 of 2)]
[im 1/24]
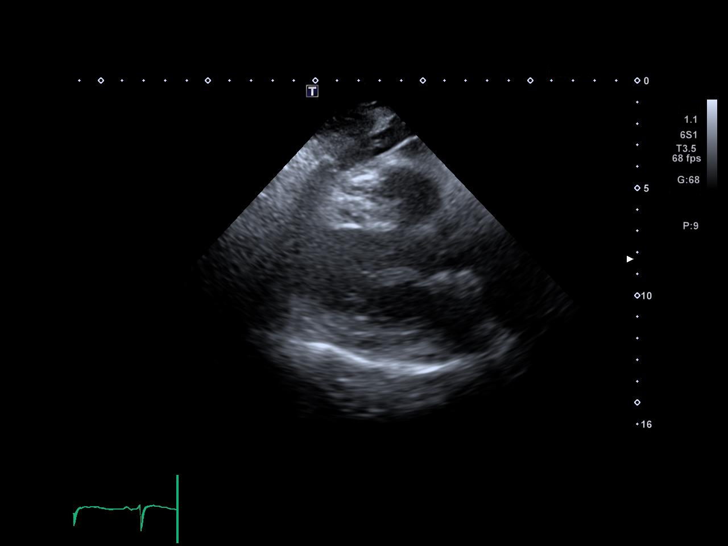
[im 2/24]
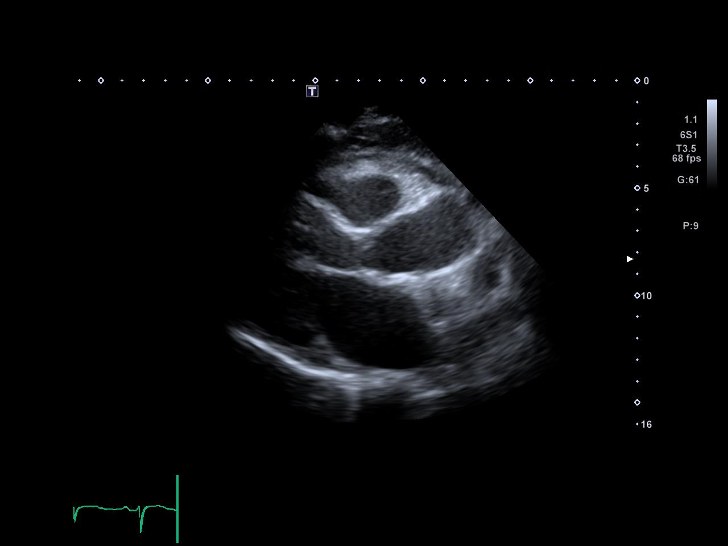
[im 6/24]
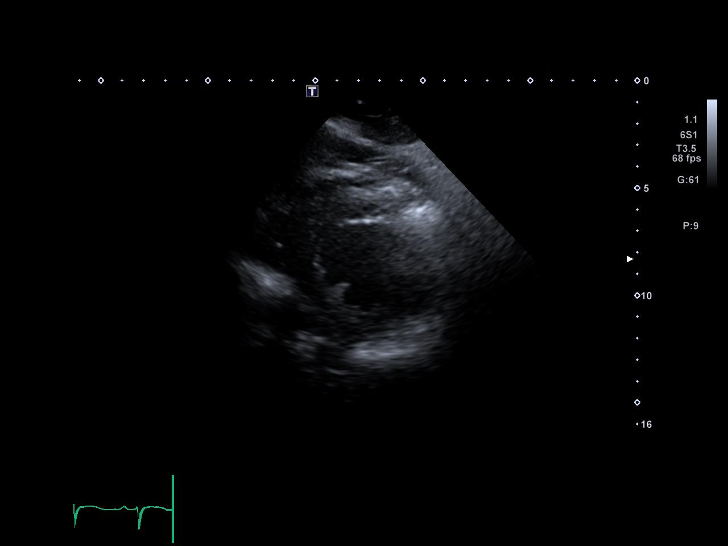
[im 8/24]
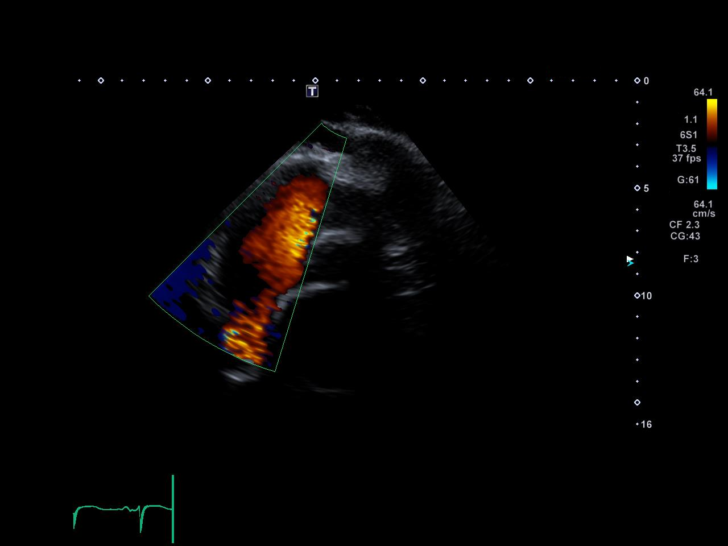
[im 9/24]
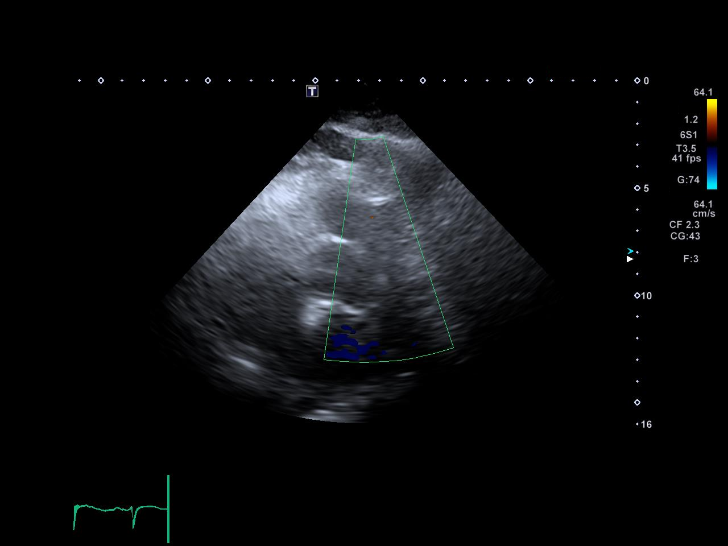
[im 11/24]
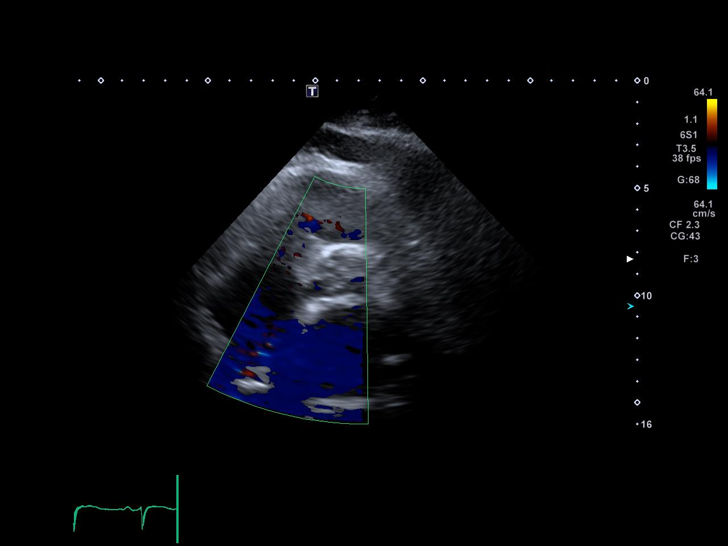
[im 15/24]
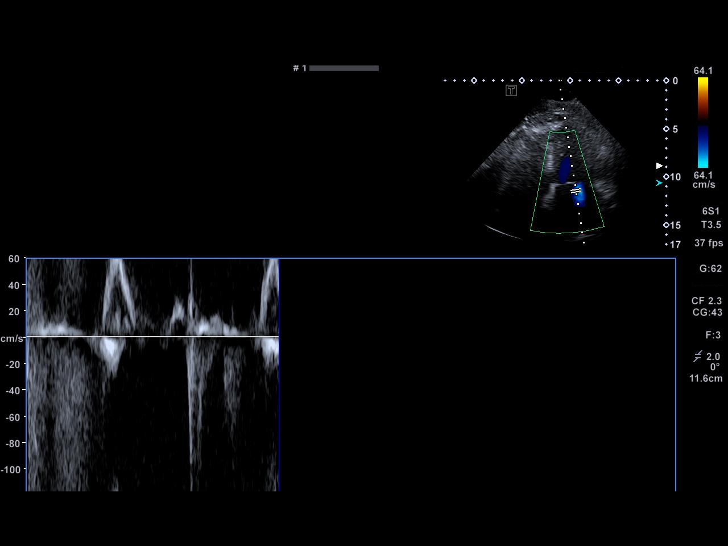
[im 16/24]
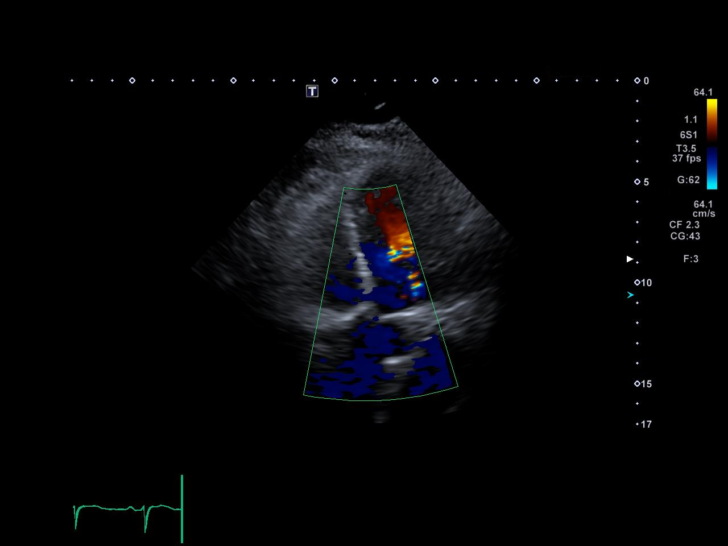
[im 18/24]
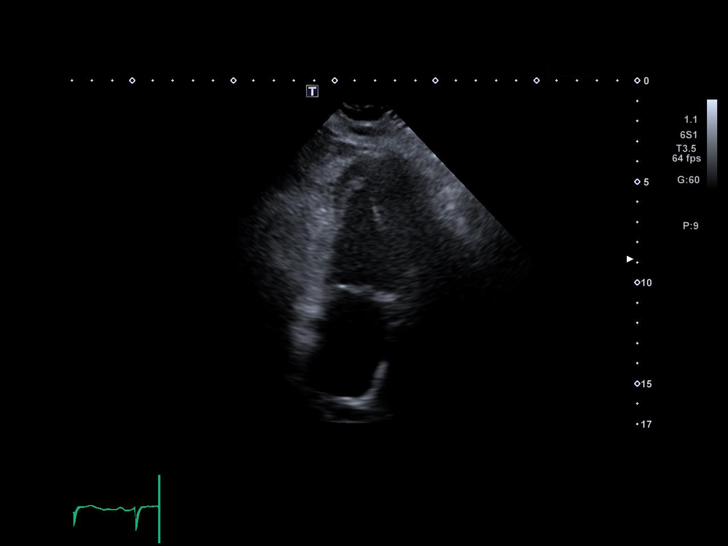
[im 20/24]
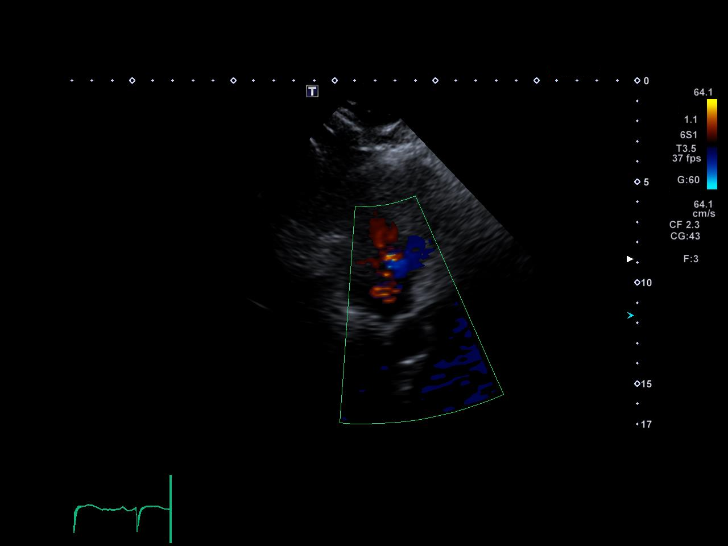
[im 22/24]
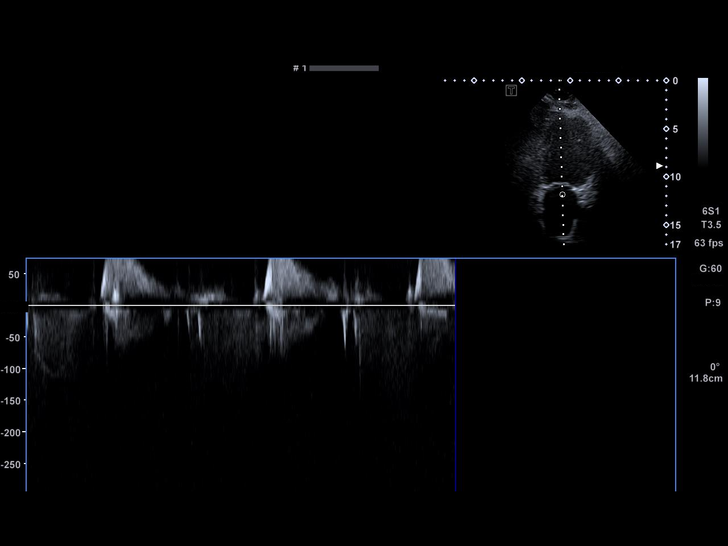

[Series 1: ca echo doppler complete · 2 of 4 slices shown (2 of 2)]
[im 1/4]
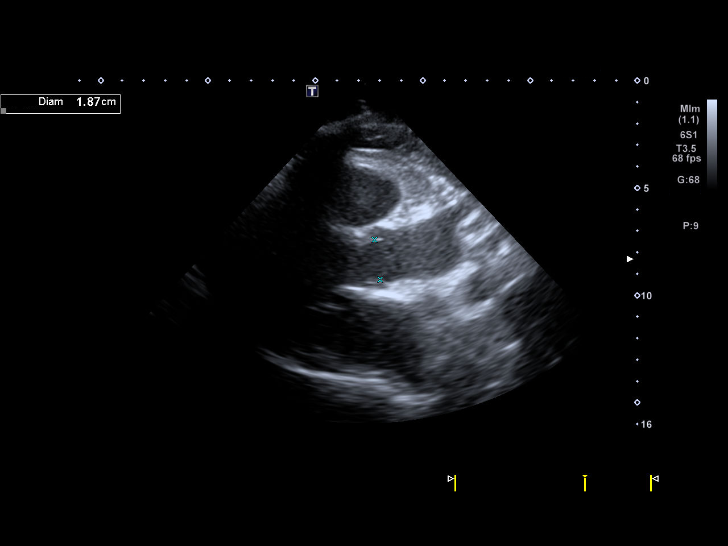
[im 4/4]
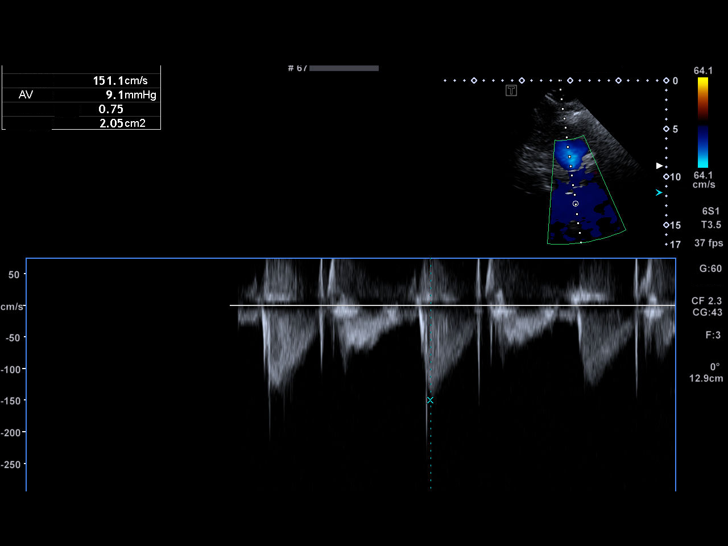

[13 of 16 positions shown; findings below may reference images not displayed]

SUMMARY: Normal left ventricular systolic function EF of 60% no evidence of regional wall motion
abnormality, mild-to-moderate mitral valve regurgitation, mildly to moderately dilated left atrium,
aortic sclerosis with no evidence of stenosis or insufficiency, normal right ventricular function,
mild tricuspid regurgitation with normal pulmonary pressures, no evidence of pericardial disease.

## 2020-07-31 ENCOUNTER — Encounter: Admit: 2020-07-31 | Discharge: 2020-07-31 | Payer: MEDICARE

## 2020-08-02 ENCOUNTER — Encounter: Admit: 2020-08-02 | Discharge: 2020-08-02 | Payer: MEDICARE

## 2020-08-02 NOTE — Progress Notes
*  STAT*     Request for the following medical records, for the purpose Continuity of Care.     Please send the following:     Recent ED/Hospital Summary   EKG's   Cardiac Cath   EP Studies   ECHO   Stress Test   MRI/CT Reports   Chest X-RAY   Device Interrogations   Labs     Please Fax to:   FAX#: [phone]  Attn: Sherry

## 2020-08-02 NOTE — Progress Notes
Request for the following medical records, for the purpose Continuity of Care.     Please send the following:     ? Most Recent OV Notes   ? Recent Labs (including INR)       Please Fax to:   FAX#: [phone]  Attn: Cordelia Pen

## 2020-08-09 ENCOUNTER — Encounter: Admit: 2020-08-09 | Discharge: 2020-08-09 | Payer: MEDICARE

## 2020-08-10 ENCOUNTER — Encounter: Admit: 2020-08-10 | Discharge: 2020-08-10 | Payer: MEDICARE

## 2020-08-11 ENCOUNTER — Encounter: Admit: 2020-08-11 | Discharge: 2020-08-11 | Payer: MEDICARE

## 2020-08-11 NOTE — Progress Notes
*  STAT*  3rd Request     Request for the following medical records, for the purpose Continuity of Care.     Please send the following:     ? Most Recent INR's     Please Fax to:   FAX#: [phone]  Attn: Cordelia Pen

## 2020-08-15 ENCOUNTER — Encounter: Admit: 2020-08-15 | Discharge: 2020-08-15 | Payer: MEDICARE

## 2020-08-15 NOTE — Progress Notes
INR 3.4. Called pt, she has not had any diet changes. She did stop sucralfate Sat. Due to lightheadedness & dizziness. Symptoms have improved. She take warfarin 5 mg on Monday and Friday, 4 mg on all the other days. She will continue current dosing per protocol and recheck in one week.     Pt also inquiring about flecainide. She is prescribed 75 mg and the 50 mg tablets crush when she tries to cut them. The 100 mg tablets cut better, so she would like those when a refill is needed. She has f/u appt next week. I advised she ask for a refill at that appt if she is continued on it at the same dose. We can send in the 100 mg tablets for her. Reviewed plan with the patient. Patient verbalized understanding and does not have any further questions or concerns. No further education requested from patient. Patient has our contact information for future needs.

## 2020-08-24 ENCOUNTER — Encounter: Admit: 2020-08-24 | Discharge: 2020-08-24 | Payer: MEDICARE

## 2020-08-24 ENCOUNTER — Ambulatory Visit: Admit: 2020-08-24 | Discharge: 2020-08-25 | Payer: MEDICARE

## 2020-08-24 DIAGNOSIS — I513 Intracardiac thrombosis, not elsewhere classified: Secondary | ICD-10-CM

## 2020-08-24 DIAGNOSIS — I4891 Unspecified atrial fibrillation: Secondary | ICD-10-CM

## 2020-08-24 DIAGNOSIS — M26609 Unspecified temporomandibular joint disorder, unspecified side: Secondary | ICD-10-CM

## 2020-08-24 DIAGNOSIS — K8689 Other specified diseases of pancreas: Secondary | ICD-10-CM

## 2020-08-24 DIAGNOSIS — E039 Hypothyroidism, unspecified: Secondary | ICD-10-CM

## 2020-08-24 DIAGNOSIS — J449 Chronic obstructive pulmonary disease, unspecified: Secondary | ICD-10-CM

## 2020-08-24 DIAGNOSIS — Z7901 Long term (current) use of anticoagulants: Secondary | ICD-10-CM

## 2020-08-24 DIAGNOSIS — H269 Unspecified cataract: Secondary | ICD-10-CM

## 2020-08-24 DIAGNOSIS — R7303 Prediabetes: Secondary | ICD-10-CM

## 2020-08-24 DIAGNOSIS — N186 End stage renal disease: Secondary | ICD-10-CM

## 2020-08-24 DIAGNOSIS — J309 Allergic rhinitis, unspecified: Secondary | ICD-10-CM

## 2020-08-24 DIAGNOSIS — I4811 Longstanding persistent atrial fibrillation: Secondary | ICD-10-CM

## 2020-08-24 DIAGNOSIS — M797 Fibromyalgia: Secondary | ICD-10-CM

## 2020-08-24 DIAGNOSIS — C7A8 Other malignant neuroendocrine tumors: Secondary | ICD-10-CM

## 2020-08-24 DIAGNOSIS — K219 Gastro-esophageal reflux disease without esophagitis: Secondary | ICD-10-CM

## 2020-08-24 DIAGNOSIS — K289 Gastrojejunal ulcer, unspecified as acute or chronic, without hemorrhage or perforation: Secondary | ICD-10-CM

## 2020-08-24 DIAGNOSIS — I10 Essential (primary) hypertension: Secondary | ICD-10-CM

## 2020-08-24 DIAGNOSIS — G4733 Obstructive sleep apnea (adult) (pediatric): Secondary | ICD-10-CM

## 2020-08-24 DIAGNOSIS — J45909 Unspecified asthma, uncomplicated: Secondary | ICD-10-CM

## 2020-08-24 DIAGNOSIS — Z8719 Personal history of other diseases of the digestive system: Secondary | ICD-10-CM

## 2020-08-24 DIAGNOSIS — E785 Hyperlipidemia, unspecified: Secondary | ICD-10-CM

## 2020-08-24 DIAGNOSIS — R079 Chest pain, unspecified: Secondary | ICD-10-CM

## 2020-08-24 DIAGNOSIS — M1990 Unspecified osteoarthritis, unspecified site: Secondary | ICD-10-CM

## 2020-08-24 MED ORDER — FLECAINIDE 150 MG PO TAB
75 mg | ORAL_TABLET | Freq: Two times a day (BID) | ORAL | 0 refills | 30.00000 days | Status: AC
Start: 2020-08-24 — End: ?

## 2020-08-24 MED ORDER — COLCHICINE 0.6 MG PO TAB
.6 mg | ORAL_TABLET | Freq: Two times a day (BID) | ORAL | 0 refills | Status: AC
Start: 2020-08-24 — End: ?

## 2020-08-24 NOTE — Progress Notes
Date of Service: 08/24/2020    Shelby Rogers is a 69 y.o. female.       HPI     Shelby Rogers was seen in the office today in electrophysiology follow up. As you may know, she is a 69 y.o. 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 She was offered Whipple procedure, but declined initially. This is now scheduled to take place within the next couple of months She follows with [name] for her atrial fibrillation management and was initally seen in clinic by him on 04/25/2020 as part of the atrial fibrillation clinic at [facility]. She was referred by Dr. Marcie Mowers in [place].     Shelby Rogers has had atrial fibrillation for over 1 year. She had a new onset in 01/2020, and she felt better after she cardioverted in 06/2019. She states she had more energy and was not short of breath, but it only lasted approximately 1.5 months. She states that she was seeing a cardiologist a few years ago, and she was told that she should be on a blood thinner indefinitely. She was started on Eliquis approximately 2 months prior to her scope, and she was switched to warfarin due to a blood clot.     Shelby Rogers did eventually undergo atrial fibrillation ablation with [name] on 07/19/2020.  We attempted to initiate dofetilide following her ablation, however she had significant QT prolongation and was resumed on flecainide 75 mg twice daily.  She was eventually discharged on 07/20/2020.  Unfortunately, she was experiencing chest discomfort several days later which prompted her to seek care at a local hospital. Her cardiac enzymes were elevated, per her report. We discussed that this is an expected finding following ablation. She underwent echo and CCTA. Echo was reportedly unremarkable and her coronary CT angiogram showed only mild nonobstructive CAD. She was on colchicine during her stay which was approx 2.5 days, but was not sent home with a prescription. She thinks that her chest pain is gradually improving, however has some persistent upper mid-back pain.     # Long-standing Persistent??atrial fibrillation s/p ablation  # Prior LAA thrombus?(on apixaban x a bit but possibly not months)  Follows with Dr. Marcie Mowers and Dr. Mickle Mallory  -CHADS2VASC of?3+?ALSO PRIOR LAA THROMBUS?(age3, HTN, woman) --?Warfarin -- prior apixaban (had LAA thrombus)  -Monitoring:?TBD  -AAD:?diltiazem?-- prior?flecainide (50 bid, failed)  -Sx:?decreased energy,?shortness of breath,?palpitations, fatigue?(04/25/20)  -OSA:?on CPAP  -EtOH:?denies (04/25/20)  -March 2020: Dx?(may have been earlier)  -02/25/2019 - ECHO: ?([facility]) EF = 74%. ?No MVP noted.   Apixaban initially  -04/13/2019 - TEE: ?Normal left ventricular systolic function. ?EF of 60% with gross normal wall motion. ?Trivial mitral valve regurgitation. ?Upper limits of normal left atrial size. ?Left atrial appendage thrombus. ?Minimal aortic sclerosis with no evidence of stenosis or insufficiency. ?Upper limits of normal to mildly dilated aortic root. ?Normal right ventricular function. ?Trivial to mild tricuspid regurgitation. ?Right atrium with no evidence of enlargement. ?No evidence of PFO or ASD. ?No evidence of pericardial disease. ?Descending thoracic aorta with no atheroma.  -05/10/2019 -?Cardiac Catheterization: ?([facility]) ?LV systolic function is normal. ?Nonobstructive CAD. ?Mildly elevated LVEDP. ?  -06/30/2019 - TEE?& DCCV:??([facility]) ?Normal left  ventricular systolic function, EF of 60%?with gross normal wall motion. Concentric left ventricular hypertrophy. Mild mitral valve regurgitation. Mild left atrial enlargement.?No evidence of thrombus within the appendage. 4/4 pulmonary veins identified. There is evidence of aortic valve sclerosis with no evidence of stenosis or insufficiency. ?Normal aortic root size. Normal right ventricular function with mild tricuspid regurgitation. Upper limits of normal right atrial size with no evidence of PFO or ASD. Trivial pulmonic insufficiency with no evidence of pericardial effusion and minimal class I atheroma -- held for about 1.5 months  -04/25/20 [name]:?Persistent and essentially longstanding persistent atrial fibrillation. ?Uncertain left atrial dimension. ?Need to sort out timing of Whipple surgery before proceeding with ablation or cardioversion given need for uninterrupted anticoagulation for a bit after either of these procedures. ?Could consider amiodarone temporarily to bridge through surgery and then proceed with ablation in the future.  -07/12/2020 CT scan: Mild left atrial enlargement, LA volume including LAA at 124 mL, no LAA thrombus, 2 left and 2 right pulmonary veins, at least mild calcified coronary plaque, small hiatal hernia, otherwise significant extracardiac abnormality  -07/17/20 [name]   -07/19/20 TEE: No LAA thrombus, mild left atrial enlargement, no interatrial shunt. LVEF 55%  -07/19/20 Ablation ([name]): Longstanding persistent AF ablation (RF WACA/PVI and posterior wall isolation). ?Arrived to the lab in atrial fibrillation. ?Challenging transseptal due to lipomatous septal hypertrophy required several additional drop-downs. ?Double transseptal performed with BRK-1 with LA pressure of 26/11. ?Voltage map of LA during AF at 0.1-0.45 mV demonstrated mild septal/periantral/posterior wall scar. ?There was also CFAE?at the septum. ?Esophagus was towards the right sided veins and thus elected to proceed with left-sided WACA initially. ?This isolated without difficulty. ?Esosure utilized to move the esophagus more towards the right during right sided WACA. ?Had some temperature rises along the lower posterior wall from 35.8 C up to 36.6C?after coming off?at?0.2C?rise. ?First-pass isolation achieved with right-sided WACA. ?Persistent independent firing from right sided pulmonary veins particularly after Isuprel washout. ?Given ongoing atrial fibrillation elected to proceed with PWI?with a roofline followed by a floor line. ?External cardioversion x1 at 300 J. ?At this juncture there were some far field signals along the posterior wall but pacing from the posterior wall suggested the most likely potential connection was along the floor. ?Mapped activation into the lower portion of the posterior wall and proceeded with ablation along the lower half of the posterior wall which achieved entrance block. ?Pacing was then performed throughout regions of the posterior wall at 20 mA / 2 ms followed by ablation until exit block was also present. ?Exit block persisted after waiting 27 minutes. ?Burst atrial pacing at 240x5 and 220x5 with no inducible arrhythmia. ?Isoproterenol 20 mcg/min x 5 minutes with multifocal atrial ectopy some of which may have been coming from the LAA. ?Some ectopy?was from the right and some was from the left atrium. ?It was too infrequent to map. ?Repeat voltage map performed and confirmed PVI/PWI. ?Wait?time between PVI and rechecking of >45 minutes. ?We will go ahead and initiate dofetilide but there is the possibility of discontinuation of dofetilide in the future depending on her course.  - 07/20/20: Did not tolerate Tikosyn due to QT prolongation. Was discharged on flecainide 75mg  BID  - 07/24/20: OSH for delayed pericarditis post ablation   - 08/24/20: OV with [name]: Maintaining sinus rhythm on flecainide, plan to f/u in 3 mo with [facility] with 7 day Holter prior. Ongoing mild CP. Given 7 days of colchicine.  # HFpEF (Chronic diastolic HF)  #?  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  -04/25/20 Upcoming Whipple?~ September or sooner  -05/25/20: Planned PET  -06/22/20: Dr. Roselle Locus: RTC with FU scans in 3 mo Declined Whipple? 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 ?She does have a small pancreatic head mass which is undergoing further evaluation by GI. ?  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 Chronic anticoagulation 04/25/2020     Warfarin. Goal INR 2-3     ? Longstanding persistent atrial fibrillation [facility]) 04/24/2020     02/25/2019 - ECHO:  [facility]) EF = 74%.  No MVP noted.   04/13/2019 - TEE - LAA thrombus  05/10/2019 - Cardiac Catheterization:  [facility])  LV systolic function is normal.  Nonobstructive CAD.  Mildly elevated LVEDP.    06/30/2019 - TEE + DCCV.  [facility])  Direct current cardioversion 200 joules synchronized energy single shock.  07/19/2020 -  Successful Persistent atrial fibrillation ablation and posterior wall isolation by [name]  07/19/2020 - Dofetilide initiated post ablation, QTc prolongation after first dose, discontinued.     ? Asthma 04/24/2020   ? COPD (chronic obstructive pulmonary disease) (HCC) 04/24/2020   ? Fibromyalgia 04/24/2020   ? GERD (gastroesophageal reflux disease) 04/24/2020   ? Hypothyroid 04/24/2020     On replacement       ? 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 LA thrombus 04/24/2020     04/13/2019 - TEE:  Normal left ventricular systolic function. ?EF of 60% with gross normal wall motion. ?Trivial mitral valve regurgitation. ?Upper limits of normal left atrial size. ?Left atrial appendage thrombus. ?Minimal aortic sclerosis with no evidence of stenosis or insufficiency. ?Upper limits of normal to mildly dilated aortic root. ?Normal right ventricular function. ?Trivial to mild tricuspid regurgitation. ?Right atrium with no evidence of enlargement. ?No evidence of PFO or ASD. ?No evidence of pericardial disease. ?Descending thoracic aorta with no atheroma.  06/30/2019 - TEE:  [facility])  Normal left ventricular systolic function, EF of 60% with gross normal wall motion. Concentric left ventricular hypertrophy. Mild mitral valve regurgitation. Mild left atrial enlargement. No evidence of thrombus within the appendage. 4/4 pulmonary veins identified. There is evidence of aortic valve sclerosis with no evidence of stenosis or insufficiency.  Normal aortic root size. Normal right ventricular function with mild tricuspid regurgitation. Upper limits of normal right atrial size with no evidence of PFO or ASD. Trivial pulmonic insufficiency with no evidence of pericardial effusion and minimal class I atheroma       ?  OSA on CPAP 04/24/2020   ? Essential hypertension 04/24/2020   ? 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 Urinary urgency 10/17/2019     08/23/2019:  Urinary urgency and nocturia.  No leakage.  No pads.  Tried Ditropan, but thought put into near retention     ? Fatigue 04/14/2017     Increased difficulty sleeping     ? Arthralgia 04/14/2017   ? Constipation 05/22/2015   ? Renal cell carcinoma (HCC) 05/02/2015     4cm right inferior pole solid mass - incidental finding on CT for salmonella poisoning  Patient referred for consideration of intervention for persistent RUQ pain    04/25/15 - R robotic partial nephrectomy (pT1bNxMn/a, FG2, margins (-), tumor necrosis)  05/22/15 - + constipation and abdominal pain. Creatinine 0.89  01/04/16: Doing well from a kidney cancer standpoint. Creatinine stable at 0.89. CXR with NED. CT A/P pending final read. We will plan to call Ms. Mcintyre with any abnormal results.     10/14/16: Doing well from kidney cancer standpoint; Cr stable at 0.80. RBUS with NED    04/14/17: New edema working with PMD, Cr 0.91, CXR negative, CT Abd/Pel with and without contrast NED, UA negative    04/13/18: Patient overall doing well. Having frequency and urgency. CT unremarkable. Cr 0.8.     08/23/2019:  No evidence of recurrence on labs, CXR, or CT Scan.  She does have a small pancreatic head mass which is undergoing further evaluation by GI.       ? Kidney mass 04/25/2015   ? Renal mass 03/06/2015     4cm right inferior pole solid mass - incidental finding on CT for salmonella poisoning  Patient referred for consideration of intervention for persistent RUQ pain           Review of Systems   Constitutional: Negative.   HENT: Negative.    Eyes: Negative.    Cardiovascular: Positive for chest pain and dyspnea on exertion.   Respiratory: Negative.    Endocrine: Negative.    Hematologic/Lymphatic: Negative.    Skin: Negative.    Musculoskeletal:        Back pain.   Gastrointestinal: Negative.    Genitourinary: Negative.    Neurological: Negative.    Psychiatric/Behavioral: Negative.    Allergic/Immunologic: Negative.    All other systems reviewed and are negative.      Physical Exam  General Appearance: no acute distress  Skin: warm & intact  HEENT: unremarkable  Neck Veins: neck veins are flat & not distended  Carotid Arteries: no bruits  Chest Inspection: chest is normal in appearance  Auscultation/Percussion: lungs clear to auscultation, no rales, rhonchi, or wheezing  Cardiac Rhythm: regular rhythm & normal rate  Cardiac Auscultation: Normal S1 & S2, no S3 or S4, no rub  Murmurs: no cardiac murmurs   Extremities: trace lower extremity edema; 2+ symmetric distal pulses  Neurologic Exam: oriented to time, place and person; no focal neurologic deficits  Psychiatric: Normal mood and affect.  Behavior is normal. Judgment and thought content normal.       Cardiovascular Studies  Preliminary ECG obtained in clinic today shows sinus rhythm with slight intraventricular conduction delay, QRS duration approximately 110 ms    Cardiovascular Health Factors  Vitals BP Readings from Last 3 Encounters:   08/24/20 (!) 154/80   07/20/20 (!) 143/72   07/12/20 139/74     Wt Readings from Last 3 Encounters:   08/24/20 107.3 kg (236 lb 9.6  oz)   07/19/20 106.8 kg (235 lb 7.2 oz)   07/19/20 106.6 kg (235 lb)     BMI Readings from Last 3 Encounters:   08/24/20 39.99 kg/m?   07/19/20 40.42 kg/m?   07/19/20 40.34 kg/m?      Smoking Social History     Tobacco Use   Smoking Status Former Smoker   ? Packs/day: 1.00   ? Years: 8.00   ? Pack years: 8.00   ? Types: Cigarettes   ? Quit date: 03/01/1985   ? Years since quitting: 35.5   Smokeless Tobacco Never Used      Lipid Profile No results found for: CHOL  No results found for: HDL  No results found for: LDL  No results found for: TRIG   Blood Sugar Hemoglobin A1C   Date Value Ref Range Status   07/19/2020 6.1 (H) 4.0 - 6.0 % Final     Comment:     The ADA recommends that most patients with type 1 and type 2 diabetes maintain   an A1c level <7%.       Glucose   Date Value Ref Range Status   07/24/2020 114 (H) 65 - 99 Final   07/20/2020 130 (H) 70 - 100 MG/DL Final   16/09/9603 540 (H) 70 - 100 MG/DL Final     Glucose, POC   Date Value Ref Range Status   04/25/2015 142 (H) 70 - 100 MG/DL Final          Problems Addressed Today  Encounter Diagnoses   Name Primary?   ? Longstanding persistent atrial fibrillation (HCC) Yes   ? LA thrombus    ? Essential hypertension    ? Chronic anticoagulation        Assessment and Plan     Ms. Lenze underwent atrial fibrillation ablation with [name] on 07/19/2020.  She was discharged the next day but unfortunately did present to her local emergency room with severe chest discomfort thought to be a delayed pericarditis. She did undergo ischemic evaluation which was negative. She did receive about 2 days of colchicine, however was not sent home with a prescription. She continues to have some transient chest discomfort and mid back pain. It is unclear if these are related.    We will have her continue her flecainide 75 mg twice daily.  She will continue anticoagulation.  We discussed that she should remain on therapeutic warfarin ideally for 3 months post ablation.  Understand that she has a Whipple procedure coming up.  Reasonable to interrupt anticoagulation after the 58-month timeframe.    We will send her with a 7-day course of colchicine given her improved but ongoing chest discomfort. Hopefully she will have resolution of her symptoms within the next week or so.    Follow-up with [name] in 2 to 3 months with 7-day Zio patch prior. After that appointment, she wishes to transfer her care back to Dr. Marcie Mowers.    Thank you for allowing me to participate in the care of this pleasant patient Please reach out with additional questions or concerns.     Seth Bake, FNP-C  Heart Rhythm Management   Department of Cardiovasular Medicine         Current Medications (including today's revisions)  ? albuterol sulfate (PROAIR HFA) 90 mcg/actuation HFA aerosol inhaler Inhale 2 puffs by mouth into the lungs every 6 hours as needed for Wheezing or Shortness of Breath. Shake well before use.   ? albuterol-ipratropium (  DUONEB) 0.5 mg-3 mg(2.5 mg base)/3 mL nebulizer solution Inhale 3 mL solution by nebulizer as directed four times daily as needed for Wheezing.   ? colchicine (COLCRYS) 0.6 mg tablet Take one tablet by mouth twice daily for 7 days.   ? dilTIAZem CD (CARDIZEM CD) 120 mg capsule Take 120 mg by mouth at bedtime daily.   ? dilTIAZem LA (CARDIZEM LA) 180 mg tablet 180 mg daily. Indications: takes 180mg  in AM   ? famotidine (PEPCID) 20 mg tablet Take 20 mg by mouth twice daily.   ? fexofenadine (ALLEGRA) 180 mg tablet Take 180 mg by mouth daily.   ? flecainide (TAMBOCOR) 150 mg tablet Take one-half tablet by mouth twice daily.   ? fluticasone (FLONASE) 50 mcg/actuation nasal spray Apply 2 Sprays to each nostril as directed daily.   ? levothyroxine (SYNTHROID) 25 mcg tablet Take 25 mcg by mouth every morning.   ? montelukast (SINGULAIR) 10 mg tablet Take 10 mg by mouth every morning.   ? other medication 1 Dose. Multivitamin with 2000 units Vitamin D and 100mg  CoQ 10   ? pantoprazole DR (PROTONIX) 40 mg tablet Take one tablet by mouth twice daily. Starting 1 week prior to procedure and for 1 month post procedure.   ? potassium chloride SR (K-DUR) 20 mEq tablet Take one tablet by mouth daily. Take with a meal and a full glass of water.   ? senna (SENOKOT) 8.6 mg tablet Take one tablet by mouth daily.   ? tiotropium bromide (SPIRIVA RESPIMAT) 2.5 mcg/actuation inhaler Inhale 2 puffs by mouth into the lungs daily.   ? valsartan (DIOVAN) 80 mg tablet Take 80 mg by mouth twice daily.   ? warfarin (COUMADIN) 4 mg tablet Take 4 mg by mouth five times weekly. Evern Bio, Wed, Thur, Sat   ? warfarin (COUMADIN) 5 mg tablet Take 5 mg by mouth twice weekly. Mon and Fri   ? zolpidem (AMBIEN) 5 mg tablet Take 2.5 mg by mouth nightly as needed.

## 2020-08-25 DIAGNOSIS — I10 Essential (primary) hypertension: Secondary | ICD-10-CM

## 2020-08-25 DIAGNOSIS — I513 Intracardiac thrombosis, not elsewhere classified: Secondary | ICD-10-CM

## 2020-08-28 ENCOUNTER — Encounter: Admit: 2020-08-28 | Discharge: 2020-08-28 | Payer: MEDICARE

## 2020-08-28 NOTE — Telephone Encounter
inr due 9/21  called Ms Flinchum today,  she will have her inr.  states she is out of town and will have first thing in am

## 2020-08-30 ENCOUNTER — Encounter: Admit: 2020-08-30 | Discharge: 2020-08-30 | Payer: MEDICARE

## 2020-08-30 NOTE — Progress Notes
Called patient to discuss INR of 2.5 which is within range.  Left detailed VM to continue current dosing regimen and recheck in 1 week.  Left CB number for further questions or concerns.

## 2020-09-06 ENCOUNTER — Encounter: Admit: 2020-09-06 | Discharge: 2020-09-06 | Payer: MEDICARE

## 2020-09-06 NOTE — Progress Notes
Description    Monitoring INR for procedure completed on 8/18. Goal 2.0-3.0    10/6 inr 2.8  to continue current dose.  LM with results and to recheck in 2 weeks.  asked her to call back to update on what she takes daily and what size are her warfarin tablets to update anticoagulation form.   she has f/u with [facility] in january and will be followed by Dr Veryl Speak after that time.

## 2020-09-06 NOTE — Progress Notes
Pt called back and gave weekly dosing she is taking. Is curious also about duration of needing to take warfarin. Also requests her PCP to be managing it again. Informed pt that since she is past 6 weeks out from her ablation, we will turn over warfarin management to her PCP. She will inform Dr. Nechama Guard office. Informed her also that the results of her heart monitor later this month would help determine how long she needs to be on warfarin. Pt verbalized understanding and agreement to plan.

## 2020-09-25 ENCOUNTER — Encounter: Admit: 2020-09-25 | Discharge: 2020-09-25 | Payer: MEDICARE

## 2020-09-27 ENCOUNTER — Encounter: Admit: 2020-09-27 | Discharge: 2020-09-27 | Payer: MEDICARE

## 2020-09-27 NOTE — Progress Notes
To be managed by PCP

## 2020-09-28 ENCOUNTER — Encounter: Admit: 2020-09-28 | Discharge: 2020-09-28 | Payer: MEDICARE

## 2020-10-04 ENCOUNTER — Encounter: Admit: 2020-10-04 | Discharge: 2020-10-04 | Payer: MEDICARE

## 2020-10-05 ENCOUNTER — Encounter: Admit: 2020-10-05 | Discharge: 2020-10-05 | Payer: MEDICARE

## 2020-10-05 NOTE — Progress Notes
Holter Placement Record  Brand: Bio-Tel(CardioNet)  Ordering Physician: [facility]  Diagnosis: AF  Length: 7 days  Location where Holter was placed: Home Enrollment  Will Holter be returned by mail? Yes     Home enrollment

## 2020-11-09 ENCOUNTER — Encounter: Admit: 2020-11-09 | Discharge: 2020-11-09 | Payer: MEDICARE

## 2020-11-09 ENCOUNTER — Ambulatory Visit: Admit: 2020-11-09 | Discharge: 2020-11-09 | Payer: MEDICARE

## 2020-11-09 DIAGNOSIS — H269 Unspecified cataract: Secondary | ICD-10-CM

## 2020-11-09 DIAGNOSIS — E039 Hypothyroidism, unspecified: Secondary | ICD-10-CM

## 2020-11-09 DIAGNOSIS — E785 Hyperlipidemia, unspecified: Secondary | ICD-10-CM

## 2020-11-09 DIAGNOSIS — K289 Gastrojejunal ulcer, unspecified as acute or chronic, without hemorrhage or perforation: Secondary | ICD-10-CM

## 2020-11-09 DIAGNOSIS — R7303 Prediabetes: Secondary | ICD-10-CM

## 2020-11-09 DIAGNOSIS — I10 Essential (primary) hypertension: Secondary | ICD-10-CM

## 2020-11-09 DIAGNOSIS — I4891 Unspecified atrial fibrillation: Secondary | ICD-10-CM

## 2020-11-09 DIAGNOSIS — M26609 Unspecified temporomandibular joint disorder, unspecified side: Secondary | ICD-10-CM

## 2020-11-09 DIAGNOSIS — K862 Cyst of pancreas: Secondary | ICD-10-CM

## 2020-11-09 DIAGNOSIS — M1990 Unspecified osteoarthritis, unspecified site: Secondary | ICD-10-CM

## 2020-11-09 DIAGNOSIS — C641 Malignant neoplasm of right kidney, except renal pelvis: Secondary | ICD-10-CM

## 2020-11-09 DIAGNOSIS — D3A8 Other benign neuroendocrine tumors: Secondary | ICD-10-CM

## 2020-11-09 DIAGNOSIS — J309 Allergic rhinitis, unspecified: Secondary | ICD-10-CM

## 2020-11-09 DIAGNOSIS — R079 Chest pain, unspecified: Secondary | ICD-10-CM

## 2020-11-09 DIAGNOSIS — J45909 Unspecified asthma, uncomplicated: Secondary | ICD-10-CM

## 2020-11-09 DIAGNOSIS — I513 Intracardiac thrombosis, not elsewhere classified: Secondary | ICD-10-CM

## 2020-11-09 DIAGNOSIS — C7A8 Other malignant neuroendocrine tumors: Secondary | ICD-10-CM

## 2020-11-09 DIAGNOSIS — J449 Chronic obstructive pulmonary disease, unspecified: Secondary | ICD-10-CM

## 2020-11-09 DIAGNOSIS — K8689 Other specified diseases of pancreas: Secondary | ICD-10-CM

## 2020-11-09 DIAGNOSIS — K219 Gastro-esophageal reflux disease without esophagitis: Secondary | ICD-10-CM

## 2020-11-09 DIAGNOSIS — G4733 Obstructive sleep apnea (adult) (pediatric): Secondary | ICD-10-CM

## 2020-11-09 DIAGNOSIS — Z8719 Personal history of other diseases of the digestive system: Secondary | ICD-10-CM

## 2020-11-09 DIAGNOSIS — M797 Fibromyalgia: Secondary | ICD-10-CM

## 2020-11-09 DIAGNOSIS — N186 End stage renal disease: Secondary | ICD-10-CM

## 2020-11-09 LAB — COMPREHENSIVE METABOLIC PANEL
Lab: 0.8 mg/dL (ref 0.3–1.2)
Lab: 0.8 mg/dL (ref 0.4–1.00)
Lab: 106 MMOL/L — ABNORMAL HIGH (ref 98–110)
Lab: 11 K/UL (ref 3–12)
Lab: 112 mg/dL — ABNORMAL HIGH (ref 70–100)
Lab: 141 MMOL/L — ABNORMAL HIGH (ref 137–147)
Lab: 16 U/L (ref 7–40)
Lab: 17 mg/dL (ref 7–25)
Lab: 24 MMOL/L (ref 21–30)
Lab: 26 U/L (ref 7–56)
Lab: 4.1 MMOL/L (ref 3.5–5.1)
Lab: 4.1 g/dL (ref 3.5–5.0)
Lab: 55 U/L (ref 25–110)
Lab: 6.5 g/dL (ref 6.0–8.0)
Lab: 9.1 mg/dL — ABNORMAL HIGH (ref 8.5–10.6)
Lab: >60 mL/min (ref >60)
Lab: >60 mL/min — ABNORMAL HIGH (ref >60)

## 2020-11-09 LAB — CBC AND DIFF
Lab: 0.1 K/UL (ref 0–0.20)
Lab: 0.3 K/UL (ref 0–0.45)
Lab: 9.7 K/UL (ref 4.5–11.0)

## 2020-11-09 MED ORDER — RP DX COPPER CU-64 DOTATATE MCI
4 | Freq: Once | INTRAVENOUS | 0 refills | Status: CP
Start: 2020-11-09 — End: ?
  Administered 2020-11-09: 18:00:00 4.4 via INTRAVENOUS

## 2020-11-09 MED ORDER — RP DX F-18 FDG MCI
10 | Freq: Once | INTRAVENOUS | 0 refills | Status: AC
Start: 2020-11-09 — End: ?

## 2020-11-09 NOTE — Progress Notes
Name: Shelby Rogers          MRN: [id number]      DOB: [birth year]      AGE: 69 y.o.   DATE OF SERVICE: 11/09/2020    Subjective:             Reason for Visit:  Follow Up      Shelby Rogers is a 69 y.o. female newly diagnosed with neuroendocrine cancer.    Cancer Staging  No matching staging information was found for the patient.    History of Present Illness  Ms.Speece is a 69 y.o. 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 She is status post right robotic partial nephrectomy with negative margins 04/25/15. Surveillance CT scans were completed in 2017 which incidentally found a subcentimeter nodule on the head of the pancreas. She had an EUS 09/25/18 revealed a 7 mm round hypoechoic lesion. This was surrounded by blood vessels. FNA was completed which revealed well-differentiated neuroendocrine tumor. MRI 10/15/18 did not reveal a pancreatic lesion. She had a CT 08/23/19 that revealed a stable, small, arterial phase hyperenhancing nodule within the pancreatic head. The size was stable compared to previous imaging. An EUS was repeated 12/07/19 which revealed an 8 mm hypoechoic mass on the head of the pancreas. Due to the blood vessels around the lesion it was difficult to perform FNA. CT scan 01/25/20 did not identify a pancreatic lesion. She met with Dr.Kumer 02/03/20, whipple surgery was offered but she declined at this time. She is here today to establish care.       Review of Systems   Constitutional: Negative for activity change, appetite change, chills, fatigue, fever and unexpected weight change.   HENT: Negative for congestion, rhinorrhea and sinus pressure.    Eyes: Negative for visual disturbance.   Respiratory: Negative for cough, shortness of breath and wheezing.    Cardiovascular: Positive for palpitations. Negative for chest pain.   Gastrointestinal: Negative for abdominal distention, abdominal pain, blood in stool, constipation, diarrhea and nausea.   Genitourinary: Negative for dysuria, frequency and urgency.   Musculoskeletal: Negative for arthralgias and myalgias.   Skin: Negative for color change and rash.   Neurological: Negative for weakness and headaches.   Psychiatric/Behavioral: The patient is not nervous/anxious.          Objective:         ? albuterol sulfate (PROAIR HFA) 90 mcg/actuation HFA aerosol inhaler Inhale 2 puffs by mouth into the lungs every 6 hours as needed for Wheezing or Shortness of Breath. Shake well before use.   ? albuterol-ipratropium (DUONEB) 0.5 mg-3 mg(2.5 mg base)/3 mL nebulizer solution Inhale 3 mL solution by nebulizer as directed four times daily as needed for Wheezing.   ? dilTIAZem CD (CARDIZEM CD) 120 mg capsule Take 120 mg by mouth at bedtime daily.   ? dilTIAZem LA (CARDIZEM LA) 180 mg tablet 180 mg daily. Indications: takes 180mg  in AM   ? famotidine (PEPCID) 20 mg tablet Take 20 mg by mouth twice daily.   ? fexofenadine (ALLEGRA) 180 mg tablet Take 180 mg by mouth daily.   ? flecainide (TAMBOCOR) 150 mg tablet Take one-half tablet by mouth twice daily. (Patient taking differently: Take 75 mg by mouth twice daily. 75MG )   ? fluticasone (FLONASE) 50 mcg/actuation nasal spray Apply 2 Sprays to each nostril as directed daily.   ? levothyroxine (SYNTHROID) 25 mcg tablet Take 25 mcg  by mouth every morning.   ? montelukast (SINGULAIR) 10 mg tablet Take 10 mg by mouth every morning.   ? other medication 1 Dose. Multivitamin with 2000 units Vitamin D and 100mg  CoQ 10   ? pantoprazole DR (PROTONIX) 40 mg tablet Take one tablet by mouth twice daily. Starting 1 week prior to procedure and for 1 month post procedure.   ? potassium chloride SR (K-DUR) 20 mEq tablet Take one tablet by mouth daily. Take with a meal and a full glass of water.   ? senna (SENOKOT) 8.6 mg tablet Take one tablet by mouth daily.   ? tiotropium bromide (SPIRIVA RESPIMAT) 2.5 mcg/actuation inhaler Inhale 2 puffs by mouth into the lungs daily.   ? valsartan (DIOVAN) 80 mg tablet Take 80 mg by mouth twice daily.   ? warfarin (COUMADIN) 4 mg tablet Take 4 mg by mouth five times weekly. Evern Bio, Wed, Thur, Sat   ? warfarin (COUMADIN) 5 mg tablet Take 5 mg by mouth twice weekly. Mon and Fri   ? zolpidem (AMBIEN) 5 mg tablet Take 2.5 mg by mouth nightly as needed.     Vitals:    11/09/20 1503   BP: 122/72   BP Source: Arm, Left Lower   Patient Position: Sitting   Pulse: 74   Resp: 16   Temp: 36.3 ?C (97.3 ?F)   SpO2: 96%   Weight: 110.7 kg (244 lb)   Height: 162.6 cm (64)   PainSc: One     Body mass index is 41.88 kg/m?Marland Kitchen     Pain Score: One  Pain Loc: Abdomen    Fatigue Scale: 4    Pain Addressed:  N/A    Patient Evaluated for a Clinical Trial: No treatment clinical trial available for this patient.     [place] Cooperative Oncology Group performance status is 0, Fully active, able to carry on all pre-disease performance without restriction.Marland Kitchen     Physical Exam  Vitals reviewed.   Constitutional:       General: She is not in acute distress.  HENT:      Head: Normocephalic and atraumatic.      Nose: Nose normal. No rhinorrhea.   Eyes:      General: No scleral icterus.        Right eye: No discharge.         Left eye: No discharge.   Cardiovascular:      Rate and Rhythm: Normal rate.   Pulmonary:      Effort: Pulmonary effort is normal. No respiratory distress.      Breath sounds: No wheezing.   Abdominal:      General: There is no distension.   Musculoskeletal:      Cervical back: Neck supple.   Skin:     Coloration: Skin is not jaundiced.   Neurological:      Mental Status: She is alert. Mental status is at baseline.      Gait: Gait normal.   Psychiatric:         Behavior: Behavior normal.         Thought Content: Thought content normal.         Judgment: Judgment normal.          CBC w diff    Lab Results   Component Value Date/Time    WBC 9.7 11/09/2020 02:21 PM    RBC 5.24 (H) 11/09/2020 02:21 PM    HGB 14.9 11/09/2020 02:21 PM    HCT  45.9 (H) 11/09/2020 02:21 PM    MCV 87.6 11/09/2020 02:21 PM    MCH 28.4 11/09/2020 02:21 PM    MCHC 32.4 11/09/2020 02:21 PM    RDW 15.8 (H) 11/09/2020 02:21 PM    PLTCT 205 11/09/2020 02:21 PM    MPV 8.4 11/09/2020 02:21 PM    Lab Results   Component Value Date/Time    NEUT 57 11/09/2020 02:21 PM    ANC 5.60 11/09/2020 02:21 PM    LYMA 26 11/09/2020 02:21 PM    ALC 2.60 11/09/2020 02:21 PM    MONA 12 11/09/2020 02:21 PM    AMC 1.10 (H) 11/09/2020 02:21 PM    EOSA 4 11/09/2020 02:21 PM    AEC 0.30 11/09/2020 02:21 PM    BASA 1 11/09/2020 02:21 PM    ABC 0.10 11/09/2020 02:21 PM        Comprehensive Metabolic Profile    Lab Results   Component Value Date/Time    NA 141 11/09/2020 02:21 PM    K 4.1 11/09/2020 02:21 PM    CL 106 11/09/2020 02:21 PM    CO2 24 11/09/2020 02:21 PM    GAP 11 11/09/2020 02:21 PM    BUN 17 11/09/2020 02:21 PM    CR 0.85 11/09/2020 02:21 PM    GLU 112 (H) 11/09/2020 02:21 PM    Lab Results   Component Value Date/Time    CA 9.1 11/09/2020 02:21 PM    ALBUMIN 4.1 11/09/2020 02:21 PM    TOTPROT 6.5 11/09/2020 02:21 PM    ALKPHOS 55 11/09/2020 02:21 PM    AST 16 11/09/2020 02:21 PM    ALT 26 11/09/2020 02:21 PM    TOTBILI 0.8 11/09/2020 02:21 PM    GFR >60 11/09/2020 02:21 PM    GFRAA >60 11/09/2020 02:21 PM             Assessment and Plan:  1- Well-differentiated neuroendocrine cancer  Surveillance CT scans were completed in 2017 which incidentally found a subcentimeter nodule on the head of the pancreas. She had an EUS 09/25/18 revealed a 7 mm round hypoechoic lesion. This was surrounded by blood vessels. FNA was completed which revealed well-differentiated neuroendocrine tumor. MRI 10/15/18 did not reveal a pancreatic lesion. She had a CT 08/23/19 that revealed a stable, small, arterial phase hyperenhancing nodule within the pancreatic head. The size was stable compared to previous imaging. An EUS was repeated 12/07/19 which revealed an 8 mm hypoechoic mass on the head of the pancreas. Due to the blood vessels around the lesion it was difficult to perform FNA. CT scan 01/25/20 did not identify a pancreatic lesion. She met with Dr.Kumer 02/03/20, whipple surgery was offered but she declined at this time.     Plan:  -Discussed her neuroendocrine cancer appears resectable based on her EUS findings and no metastatic disease present on imaging. She does not want to proceed with surgery at this time.  -Will continue to monitor the size of her pancreatic lesion. Return in 3 months with lab, office visit, and CT scans    06/22/20:   Labs reviewed   Scans (dotatate) reviewed - no evidence of progression or metastatic disease   She is scheduled for cardiac ablation procedure on 07/19/20. Per her cardiologist, she will need to be on anticoagulation for at least 3 months.   Discussed delaying her pancreatic surgery 3 months. RTC in 3 months with labs and follow up scans (dotatate scan)     11/09/20:  Reviewed current dotatate scan showing activity over the primary tumor in the pancreas with no evidence of metastatic disease.   She is s/p cardiac ablation procedure and is ready now to see Dr Lucianne Muss to discuss surgery   Referral placed to see Dr Cathie Beams within the next 2-3 weeks to discuss surgery     RTC in 3 months to review surgical pathology and surveillance plan     2- Palpitations  -Follows with cardiology at [facility] in [place]. She has history of atrial fibrillation.   -s/p ablation for atrial fibrillation       The above assessment and recommendations were discussed with the patient/ family today and they are in agreement with the plan.  Their questions and concerns were addressed to the best of their satisfaction. Patient/family has/have the phone numbers for the Cancer Center and was/were instructed on how to contact us with any questions or concerns.

## 2020-11-09 NOTE — Patient Instructions
Dr.Anwaar Saeed,  Medical Oncologist specializing in Gastrointestinal malignancies    [place], Nurse Practitioner     and Lynnette Lawton, Clinical Nurse Coordinators    Phone: [phone]   Monday-Friday 8:00am- 4:00pm   Fax: [phone]    After Hours Phone: [phone]- On call number for urgent needs outside of business hours ask for the On call Oncology fellow to be paged and they will call you back    Medication refills: please contact your pharmacy for medication refills, if they do not have any refills on file they will contact the office, make sure they have our correct contact information on file   P: [phone], F: [phone]    MyChart Messages: All mychart messages are answered by the nurses, even if you select to send the message to Dr.Saeed or . We often communicate with Dr.Saeed or  regarding how to answer these messages. Messages are answered 8:00am-4:00pm Monday-Friday.    Phone Calls: We are typically not at our desks where our phones are located as we are in clinic. Please leave us a message as we check our messages several times per day. It is our goal to answer these messages as soon as possible. Messages are answered from 8:00am-4:00 pm Monday-Friday. Please make sure you leave your full name with the spelling, date of birth, and reason for your call when leaving a message.

## 2020-11-10 ENCOUNTER — Encounter: Admit: 2020-11-10 | Discharge: 2020-11-10 | Payer: MEDICARE

## 2020-11-10 NOTE — Telephone Encounter
Pt seen in march 2021 by Dr.Kumer and was offered whipple surgery. She declined at the time.     06/2020: cans (dotatate) reviewed - no evidence of progression or metastatic disease   She is scheduled for cardiac ablation procedure on 07/19/20. Per her cardiologist, she will need to be on anticoagulation for at least 3 months.   Discussed delaying her pancreatic surgery 3 months. RTC in 3 months with labs and follow up scans (dotatate scan)     11/09/20: per oncology  Reviewed current dotatate scan showing activity over the primary tumor in the pancreas with no evidence of metastatic disease.   She is s/p cardiac ablation procedure and is ready now to see Dr Dwyane Dee to discuss surgery.

## 2020-11-14 ENCOUNTER — Encounter: Admit: 2020-11-14 | Discharge: 2020-11-14 | Payer: MEDICARE

## 2020-11-14 MED ORDER — FLECAINIDE 150 MG PO TAB
75 mg | ORAL_TABLET | Freq: Two times a day (BID) | ORAL | 3 refills | 30.00000 days | Status: AC
Start: 2020-11-14 — End: ?

## 2020-11-15 ENCOUNTER — Encounter: Admit: 2020-11-15 | Discharge: 2020-11-15 | Payer: MEDICARE

## 2020-11-16 ENCOUNTER — Encounter: Admit: 2020-11-16 | Discharge: 2020-11-16 | Payer: MEDICARE

## 2020-11-16 ENCOUNTER — Ambulatory Visit: Admit: 2020-11-16 | Discharge: 2020-11-17 | Payer: MEDICARE

## 2020-11-16 DIAGNOSIS — C7A8 Other malignant neuroendocrine tumors: Secondary | ICD-10-CM

## 2020-11-16 DIAGNOSIS — H269 Unspecified cataract: Secondary | ICD-10-CM

## 2020-11-16 DIAGNOSIS — E785 Hyperlipidemia, unspecified: Secondary | ICD-10-CM

## 2020-11-16 DIAGNOSIS — R7303 Prediabetes: Secondary | ICD-10-CM

## 2020-11-16 DIAGNOSIS — G4733 Obstructive sleep apnea (adult) (pediatric): Secondary | ICD-10-CM

## 2020-11-16 DIAGNOSIS — N186 End stage renal disease: Secondary | ICD-10-CM

## 2020-11-16 DIAGNOSIS — M26609 Unspecified temporomandibular joint disorder, unspecified side: Secondary | ICD-10-CM

## 2020-11-16 DIAGNOSIS — J309 Allergic rhinitis, unspecified: Secondary | ICD-10-CM

## 2020-11-16 DIAGNOSIS — E039 Hypothyroidism, unspecified: Secondary | ICD-10-CM

## 2020-11-16 DIAGNOSIS — R079 Chest pain, unspecified: Secondary | ICD-10-CM

## 2020-11-16 DIAGNOSIS — M797 Fibromyalgia: Secondary | ICD-10-CM

## 2020-11-16 DIAGNOSIS — I4891 Unspecified atrial fibrillation: Secondary | ICD-10-CM

## 2020-11-16 DIAGNOSIS — K289 Gastrojejunal ulcer, unspecified as acute or chronic, without hemorrhage or perforation: Secondary | ICD-10-CM

## 2020-11-16 DIAGNOSIS — J449 Chronic obstructive pulmonary disease, unspecified: Secondary | ICD-10-CM

## 2020-11-16 DIAGNOSIS — I10 Essential (primary) hypertension: Secondary | ICD-10-CM

## 2020-11-16 DIAGNOSIS — M1990 Unspecified osteoarthritis, unspecified site: Secondary | ICD-10-CM

## 2020-11-16 DIAGNOSIS — C641 Malignant neoplasm of right kidney, except renal pelvis: Secondary | ICD-10-CM

## 2020-11-16 DIAGNOSIS — K219 Gastro-esophageal reflux disease without esophagitis: Secondary | ICD-10-CM

## 2020-11-16 DIAGNOSIS — J45909 Unspecified asthma, uncomplicated: Secondary | ICD-10-CM

## 2020-11-16 DIAGNOSIS — K8689 Other specified diseases of pancreas: Secondary | ICD-10-CM

## 2020-11-16 DIAGNOSIS — I513 Intracardiac thrombosis, not elsewhere classified: Secondary | ICD-10-CM

## 2020-11-16 DIAGNOSIS — Z8719 Personal history of other diseases of the digestive system: Secondary | ICD-10-CM

## 2020-12-01 ENCOUNTER — Encounter: Admit: 2020-12-01 | Discharge: 2020-12-01 | Payer: MEDICARE

## 2020-12-10 ENCOUNTER — Encounter: Admit: 2020-12-10 | Discharge: 2020-12-10 | Payer: MEDICARE

## 2020-12-11 ENCOUNTER — Encounter: Admit: 2020-12-11 | Discharge: 2020-12-11 | Payer: MEDICARE

## 2020-12-14 ENCOUNTER — Encounter: Admit: 2020-12-14 | Discharge: 2020-12-14 | Payer: MEDICARE

## 2020-12-27 ENCOUNTER — Encounter: Admit: 2020-12-27 | Discharge: 2020-12-27 | Payer: MEDICARE

## 2020-12-27 NOTE — Progress Notes
Plan:  Lodging/Transportation     Intervention:  SW received a VM from pt regarding lodging and transportation for future appointments.  SW returned pt's call.  SW explained role and provided contact information.   Pt reported her daughter usually brings her to appointments however daughter had a bad accident at work and will not be able to provide transportation.  Pt unsure if she has a transportation benefit with [place] Medicaid.  SW will follow up with Medicaid to inquire about Medicaid transportation this week.  SW provided general information about [facility]  SW answered all questions.  SW will follow up with pt.     Tresa Garter, LMSW

## 2020-12-29 ENCOUNTER — Encounter: Admit: 2020-12-29 | Discharge: 2020-12-29 | Payer: MEDICARE

## 2020-12-29 NOTE — Progress Notes
Plan: Medicaid Transportation     Intervention:  SW called [employer] [phone] to inquire if pt has access to [facility] transportation. SW on hold for several minutes.   It was reported pt does have access to Medicaid transportation.  SW attempted to schedule a ride for appointments on 02/01/21, it was reported system will accept trip as it is over 30 days out.  SW told to call back in February.     SW called and provided pt with update.  Pt appreciated the information.  No other needs identified at this time.  SW will continue to provide support as needed.    Tresa Garter, LMSW

## 2021-01-05 ENCOUNTER — Encounter: Admit: 2021-01-05 | Discharge: 2021-01-05 | Payer: MEDICARE

## 2021-01-05 NOTE — Progress Notes
Plan:  Transportation    Intervention:  SW e-mailed [place] requesting assistance with getting transportation for upcoming appointments;  02/01/21 @ 9:15am w/ [place]. [address] [place], [place] [address]. Return ride 2:00pm, pt will need to be picked up from [place].    Tresa Garter, LMSW

## 2021-01-31 ENCOUNTER — Encounter: Admit: 2021-01-31 | Discharge: 2021-01-31 | Payer: MEDICARE

## 2021-01-31 NOTE — Progress Notes
Plan:   Transportation/Support     Intervention:  SW notified by CMA [place] Medicaid denied pt's transportation requesting.  CMA attempted to arrange transportation on 01/30/21, initial request was made to [place] on 2/4 by this SW to arrange transportation for pt's appointment on 02/01/21.    SW called [employer] [phone].  It was reported trip was denied due to mileage limits and LMN form needs to be completed by provider.  SW requesting LMN document be faxed to [phone].     SW called and spoke with pt.  SW explained role and provided contact information.  SW explained Medicaid trip has been denied until provider signs off on LMN.  Pt reported she has a back up driver and will plan to be at her appointments 3/3.  Pt very anxious for whipple surgery.  SW listened and provided support during call.  No other needs identified at this time.  SW will continue to provide support as needed.     Tresa Garter, LMSW

## 2021-02-01 ENCOUNTER — Encounter: Admit: 2021-02-01 | Discharge: 2021-02-01 | Payer: MEDICARE

## 2021-02-01 ENCOUNTER — Ambulatory Visit: Admit: 2021-02-01 | Discharge: 2021-02-01 | Payer: MEDICARE

## 2021-02-01 DIAGNOSIS — D3A8 Other benign neuroendocrine tumors: Secondary | ICD-10-CM

## 2021-02-01 DIAGNOSIS — R7303 Prediabetes: Secondary | ICD-10-CM

## 2021-02-01 DIAGNOSIS — E039 Hypothyroidism, unspecified: Secondary | ICD-10-CM

## 2021-02-01 DIAGNOSIS — N186 End stage renal disease: Secondary | ICD-10-CM

## 2021-02-01 DIAGNOSIS — C7A8 Other malignant neuroendocrine tumors: Secondary | ICD-10-CM

## 2021-02-01 DIAGNOSIS — I10 Essential (primary) hypertension: Secondary | ICD-10-CM

## 2021-02-01 DIAGNOSIS — I513 Intracardiac thrombosis, not elsewhere classified: Secondary | ICD-10-CM

## 2021-02-01 DIAGNOSIS — J449 Chronic obstructive pulmonary disease, unspecified: Secondary | ICD-10-CM

## 2021-02-01 DIAGNOSIS — E785 Hyperlipidemia, unspecified: Secondary | ICD-10-CM

## 2021-02-01 DIAGNOSIS — M26609 Unspecified temporomandibular joint disorder, unspecified side: Secondary | ICD-10-CM

## 2021-02-01 DIAGNOSIS — M1990 Unspecified osteoarthritis, unspecified site: Secondary | ICD-10-CM

## 2021-02-01 DIAGNOSIS — I4891 Unspecified atrial fibrillation: Secondary | ICD-10-CM

## 2021-02-01 DIAGNOSIS — H269 Unspecified cataract: Secondary | ICD-10-CM

## 2021-02-01 DIAGNOSIS — G4733 Obstructive sleep apnea (adult) (pediatric): Secondary | ICD-10-CM

## 2021-02-01 DIAGNOSIS — Z8719 Personal history of other diseases of the digestive system: Secondary | ICD-10-CM

## 2021-02-01 DIAGNOSIS — C7B8 Other secondary neuroendocrine tumors: Secondary | ICD-10-CM

## 2021-02-01 DIAGNOSIS — R079 Chest pain, unspecified: Secondary | ICD-10-CM

## 2021-02-01 DIAGNOSIS — K8689 Other specified diseases of pancreas: Secondary | ICD-10-CM

## 2021-02-01 DIAGNOSIS — K289 Gastrojejunal ulcer, unspecified as acute or chronic, without hemorrhage or perforation: Secondary | ICD-10-CM

## 2021-02-01 DIAGNOSIS — J309 Allergic rhinitis, unspecified: Secondary | ICD-10-CM

## 2021-02-01 DIAGNOSIS — K219 Gastro-esophageal reflux disease without esophagitis: Secondary | ICD-10-CM

## 2021-02-01 DIAGNOSIS — M797 Fibromyalgia: Secondary | ICD-10-CM

## 2021-02-01 DIAGNOSIS — J45909 Unspecified asthma, uncomplicated: Secondary | ICD-10-CM

## 2021-02-01 LAB — CBC AND DIFF
Lab: 14 g/dL (ref 12.0–15.0)
Lab: 3 % (ref 0–5)
Lab: 4.7 K/UL (ref 1.8–7.0)
Lab: 4.7 M/UL (ref 4.0–5.0)
Lab: 42 % (ref 36–45)
Lab: 8.2 K/UL (ref 4.5–11.0)

## 2021-02-01 LAB — COMPREHENSIVE METABOLIC PANEL
Lab: 0.6 mg/dL — ABNORMAL HIGH (ref 0.3–1.2)
Lab: 0.8 mg/dL (ref 0.4–1.00)
Lab: 107 MMOL/L (ref 98–110)
Lab: 109 mg/dL — ABNORMAL HIGH (ref 70–100)
Lab: 14 mg/dL (ref 7–25)
Lab: 142 MMOL/L (ref 137–147)
Lab: 25 U/L — ABNORMAL HIGH (ref 7–56)
Lab: 28 MMOL/L (ref 21–30)
Lab: 3.8 MMOL/L (ref 3.5–5.1)
Lab: 43 U/L (ref 25–110)
Lab: 6.8 g/dL — ABNORMAL LOW (ref 6.0–8.0)
Lab: 7 K/UL (ref 3–12)
Lab: 9.6 mg/dL (ref 8.5–10.6)
Lab: >60 mL/min (ref >60)

## 2021-02-01 MED ORDER — RP DX COPPER CU-64 DOTATATE MCI
4 | Freq: Once | INTRAVENOUS | 0 refills | Status: CP
Start: 2021-02-01 — End: ?
  Administered 2021-02-01: 15:00:00 4.3 via INTRAVENOUS

## 2021-02-01 NOTE — Patient Instructions
Dr.Anwaar Saeed,  Medical Oncologist specializing in Gastrointestinal malignancies    [place], Nurse Practitioner     and Lynnette Lawton, Clinical Nurse Coordinators    Phone: [phone]   Monday-Friday 8:00am- 4:00pm   Fax: [phone]    After Hours Phone: [phone]- On call number for urgent needs outside of business hours ask for the On call Oncology fellow to be paged and they will call you back    Medication refills: please contact your pharmacy for medication refills, if they do not have any refills on file they will contact the office, make sure they have our correct contact information on file   P: [phone], F: [phone]    MyChart Messages: All mychart messages are answered by the nurses, even if you select to send the message to Dr.Saeed or . We often communicate with Dr.Saeed or  regarding how to answer these messages. Messages are answered 8:00am-4:00pm Monday-Friday.    Phone Calls: We are typically not at our desks where our phones are located as we are in clinic. Please leave us a message as we check our messages several times per day. It is our goal to answer these messages as soon as possible. Messages are answered from 8:00am-4:00 pm Monday-Friday. Please make sure you leave your full name with the spelling, date of birth, and reason for your call when leaving a message.

## 2021-02-01 NOTE — Progress Notes
Name: Shelby Rogers          MRN: [id number]      DOB: 09/08/1951      AGE: 70 y.o.   DATE OF SERVICE: 02/01/2021    Subjective:             Reason for Visit:  Follow Up      Shelby Rogers is a 70 y.o. female newly diagnosed with neuroendocrine cancer.    Cancer Staging  No matching staging information was found for the patient.    History of Present Illness  Ms.Lope is a 70 y.o. 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 She is status post right robotic partial nephrectomy with negative margins 04/25/15. Surveillance CT scans were completed in 2017 which incidentally found a subcentimeter nodule on the head of the pancreas. She had an EUS 09/25/18 revealed a 7 mm round hypoechoic lesion. This was surrounded by blood vessels. FNA was completed which revealed well-differentiated neuroendocrine tumor. MRI 10/15/18 did not reveal a pancreatic lesion. She had a CT 08/23/19 that revealed a stable, small, arterial phase hyperenhancing nodule within the pancreatic head. The size was stable compared to previous imaging. An EUS was repeated 12/07/19 which revealed an 8 mm hypoechoic mass on the head of the pancreas. Due to the blood vessels around the lesion it was difficult to perform FNA. CT scan 01/25/20 did not identify a pancreatic lesion. She met with Dr.Kumer 02/03/20, whipple surgery was offered but she declined at this time. She is here today to establish care.       Review of Systems   Constitutional: Negative for activity change, appetite change, chills, fatigue, fever and unexpected weight change.   HENT: Negative for congestion, rhinorrhea and sinus pressure.    Eyes: Negative for visual disturbance.   Respiratory: Negative for cough, shortness of breath and wheezing.    Cardiovascular: Positive for palpitations. Negative for chest pain.   Gastrointestinal: Negative for abdominal distention, abdominal pain, blood in stool, constipation, diarrhea and nausea.   Genitourinary: Negative for dysuria, frequency and urgency.   Musculoskeletal: Negative for arthralgias and myalgias.   Skin: Negative for color change and rash.   Neurological: Negative for weakness and headaches.   Psychiatric/Behavioral: The patient is not nervous/anxious.          Objective:         ? albuterol sulfate (PROAIR HFA) 90 mcg/actuation HFA aerosol inhaler Inhale 2 puffs by mouth into the lungs every 6 hours as needed for Wheezing or Shortness of Breath. Shake well before use.   ? albuterol-ipratropium (DUONEB) 0.5 mg-3 mg(2.5 mg base)/3 mL nebulizer solution Inhale 3 mL solution by nebulizer as directed four times daily as needed for Wheezing.   ? dilTIAZem CD (CARDIZEM CD) 120 mg capsule Take 120 mg by mouth at bedtime daily.   ? dilTIAZem LA (CARDIZEM LA) 180 mg tablet 180 mg daily. Indications: takes 180mg  in AM   ? famotidine (PEPCID) 20 mg tablet Take 20 mg by mouth twice daily.   ? fexofenadine (ALLEGRA) 180 mg tablet Take 180 mg by mouth daily.   ? fish oil /omega-3 fatty acids (SEA-OMEGA) 340/1000 mg capsule Take 1 capsule by mouth daily.   ? flecainide (TAMBOCOR) 150 mg tablet Take one-half tablet by mouth twice daily.   ? fluticasone (FLONASE) 50 mcg/actuation nasal spray Apply 2 Sprays to each nostril as directed daily.   ? levothyroxine (  SYNTHROID) 25 mcg tablet Take 25 mcg by mouth every morning.   ? montelukast (SINGULAIR) 10 mg tablet Take 10 mg by mouth every morning.   ? other medication 1 Dose. Multivitamin with 2000 units Vitamin D and 100mg  CoQ 10   ? pantoprazole DR (PROTONIX) 40 mg tablet Take one tablet by mouth twice daily. Starting 1 week prior to procedure and for 1 month post procedure.   ? potassium chloride SR (K-DUR) 20 mEq tablet Take one tablet by mouth daily. Take with a meal and a full glass of water.   ? rivaroxaban (XARELTO) 20 mg tablet Take one tablet by mouth daily with dinner. Take with food.   ? senna (SENOKOT) 8.6 mg tablet Take one tablet by mouth daily.   ? tiotropium bromide (SPIRIVA RESPIMAT) 2.5 mcg/actuation inhaler Inhale 2 puffs by mouth into the lungs daily.   ? valsartan (DIOVAN) 80 mg tablet Take 80 mg by mouth twice daily.   ? zolpidem (AMBIEN) 5 mg tablet Take 2.5 mg by mouth nightly as needed.     Vitals:    02/01/21 1210   BP: (!) 161/75   BP Source: Arm, Left Lower   Patient Position: Sitting   Pulse: 99   Resp: 16   Temp: 36.4 ?C (97.6 ?F)   SpO2: 99%   Weight: 111.1 kg (245 lb)   Height: 162.6 cm (5' 4)   PainSc: Zero     Body mass index is 42.05 kg/m?Marland Kitchen     Pain Score: Zero       Fatigue Scale: 0-None    Pain Addressed:  N/A    Patient Evaluated for a Clinical Trial: No treatment clinical trial available for this patient.     [place] Cooperative Oncology Group performance status is 0, Fully active, able to carry on all pre-disease performance without restriction.Marland Kitchen     Physical Exam  Vitals reviewed.   Constitutional:       General: She is not in acute distress.  HENT:      Head: Normocephalic and atraumatic.      Nose: Nose normal. No rhinorrhea.   Eyes:      General: No scleral icterus.        Right eye: No discharge.         Left eye: No discharge.   Cardiovascular:      Rate and Rhythm: Normal rate.   Pulmonary:      Effort: Pulmonary effort is normal. No respiratory distress.      Breath sounds: No wheezing.   Abdominal:      General: There is no distension.   Musculoskeletal:      Cervical back: Neck supple.   Skin:     Coloration: Skin is not jaundiced.   Neurological:      Mental Status: She is alert. Mental status is at baseline.      Gait: Gait normal.   Psychiatric:         Behavior: Behavior normal.         Thought Content: Thought content normal.         Judgment: Judgment normal.          CBC w diff    Lab Results   Component Value Date/Time    WBC 8.2 02/01/2021 08:47 AM    RBC 4.78 02/01/2021 08:47 AM    HGB 14.0 02/01/2021 08:47 AM    HCT 42.5 02/01/2021 08:47 AM    MCV 88.9 02/01/2021 08:47 AM  MCH 29.3 02/01/2021 08:47 AM    MCHC 32.9 02/01/2021 08:47 AM    RDW 14.9 02/01/2021 08:47 AM    PLTCT 216 02/01/2021 08:47 AM    MPV 8.6 02/01/2021 08:47 AM    Lab Results   Component Value Date/Time    NEUT 58 02/01/2021 08:47 AM    ANC 4.70 02/01/2021 08:47 AM    LYMA 23 (L) 02/01/2021 08:47 AM    ALC 1.90 02/01/2021 08:47 AM    MONA 15 (H) 02/01/2021 08:47 AM    AMC 1.30 (H) 02/01/2021 08:47 AM    EOSA 3 02/01/2021 08:47 AM    AEC 0.30 02/01/2021 08:47 AM    BASA 1 02/01/2021 08:47 AM    ABC 0.10 02/01/2021 08:47 AM        Comprehensive Metabolic Profile    Lab Results   Component Value Date/Time    NA 142 02/01/2021 08:47 AM    K 3.8 02/01/2021 08:47 AM    CL 107 02/01/2021 08:47 AM    CO2 28 02/01/2021 08:47 AM    GAP 7 02/01/2021 08:47 AM    BUN 14 02/01/2021 08:47 AM    CR 0.84 02/01/2021 08:47 AM    GLU 109 (H) 02/01/2021 08:47 AM    Lab Results   Component Value Date/Time    CA 9.6 02/01/2021 08:47 AM    ALBUMIN 4.2 02/01/2021 08:47 AM    TOTPROT 6.8 02/01/2021 08:47 AM    ALKPHOS 43 02/01/2021 08:47 AM    AST 20 02/01/2021 08:47 AM    ALT 25 02/01/2021 08:47 AM    TOTBILI 0.6 02/01/2021 08:47 AM    GFR >60 11/09/2020 02:21 PM    GFRAA >60 11/09/2020 02:21 PM             Assessment and Plan:  1- Well-differentiated neuroendocrine cancer  Surveillance CT scans were completed in 2017 which incidentally found a subcentimeter nodule on the head of the pancreas. She had an EUS 09/25/18 revealed a 7 mm round hypoechoic lesion. This was surrounded by blood vessels. FNA was completed which revealed well-differentiated neuroendocrine tumor. MRI 10/15/18 did not reveal a pancreatic lesion. She had a CT 08/23/19 that revealed a stable, small, arterial phase hyperenhancing nodule within the pancreatic head. The size was stable compared to previous imaging. An EUS was repeated 12/07/19 which revealed an 8 mm hypoechoic mass on the head of the pancreas. Due to the blood vessels around the lesion it was difficult to perform FNA. CT scan 01/25/20 did not identify a pancreatic lesion. She met with Dr.Kumer 02/03/20, whipple surgery was offered but she declined at this time.     Plan:  -Discussed her neuroendocrine cancer appears resectable based on her EUS findings and no metastatic disease present on imaging. She does not want to proceed with surgery at this time.  -Will continue to monitor the size of her pancreatic lesion. Return in 3 months with lab, office visit, and CT scans    06/22/20:   Labs reviewed   Scans (dotatate) reviewed - no evidence of progression or metastatic disease   She is scheduled for cardiac ablation procedure on 07/19/20. Per her cardiologist, she will need to be on anticoagulation for at least 3 months.   Discussed delaying her pancreatic surgery 3 months. RTC in 3 months with labs and follow up scans (dotatate scan)     11/09/20:   Reviewed current dotatate scan showing activity over the primary tumor in the pancreas with no  evidence of metastatic disease.   She is s/p cardiac ablation procedure and is ready now to see Dr Lucianne Muss to discuss surgery    Referral placed to see Dr Cathie Beams within the next 2-3 weeks to discuss surgery     02/01/21:   Labs reviewed   Dotatate scan completed earlier today - awaiting radiology read   She is scheduled to meet with Dr Cathie Beams today to discuss surgery   -Discussed that based on surgical outcome, she may not need sandostatin     RTC in 4-6 weeks (telehealth) to review surgical results and possible adjuvant therapy       2- Palpitations  -Follows with cardiology at [facility] in [place]. She has history of atrial fibrillation.   -s/p ablation for atrial fibrillation       The above assessment and recommendations were discussed with the patient/ family today and they are in agreement with the plan.  Their questions and concerns were addressed to the best of their satisfaction. Patient/family has/have the phone numbers for the [facility] and was/were instructed on how to contact us with any questions or concerns.

## 2021-02-01 NOTE — Progress Notes
Date of Service: 02/01/2021    Shelby Rogers is a 70 y.o. female.    Subjective:            History of Present Illness     Shelby Rogers is a 70 year old female who presents to [facility] clinic to discuss known pancreatic head neuroendocrine tumor.  Patient underwent a partial right nephrectomy in 2016 for renal cell carcinoma and in 2017 was found to have a 7 mm pancreatic head lesion on surveillance CT imaging.  She had an EUS and FNA biopsy performed in January 2021 which confirmed well-differentiated neuroendocrine tumor.  Her chromogranin level was found to be 525 in December 2021.  She follows with Dr. Roselle Locus with medical oncology, who referred the patient to discuss surgical management.  She denies any acute complaints at this time.  She is tolerating a regular diet without nausea or vomiting.  She denies any fevers, chills, chest pain, shortness of breath. She had a PET scan today which was negative for metastases.    Medical history significant for A-fib s/p ablation and LA thrombus. Patient currently takes Xarelto and flecainide. Also has a history of HTN, COPD,  Hypothyroidism. PSH includes cholecystectomy, appendectomy, hysterectomy, partial R nephrectomy.    Review of Systems     10 point ROS negative unless noted in HPI    Objective:         ? albuterol sulfate (PROAIR HFA) 90 mcg/actuation HFA aerosol inhaler Inhale 2 puffs by mouth into the lungs every 6 hours as needed for Wheezing or Shortness of Breath. Shake well before use.   ? albuterol-ipratropium (DUONEB) 0.5 mg-3 mg(2.5 mg base)/3 mL nebulizer solution Inhale 3 mL solution by nebulizer as directed four times daily as needed for Wheezing.   ? dilTIAZem CD (CARDIZEM CD) 120 mg capsule Take 120 mg by mouth at bedtime daily.   ? dilTIAZem LA (CARDIZEM LA) 180 mg tablet 180 mg daily. Indications: takes 180mg  in AM   ? famotidine (PEPCID) 20 mg tablet Take 20 mg by mouth twice daily.   ? fexofenadine (ALLEGRA) 180 mg tablet Take 180 mg by mouth daily.   ? fish oil /omega-3 fatty acids (SEA-OMEGA) 340/1000 mg capsule Take 1 capsule by mouth daily.   ? flecainide (TAMBOCOR) 150 mg tablet Take one-half tablet by mouth twice daily.   ? fluticasone (FLONASE) 50 mcg/actuation nasal spray Apply 2 Sprays to each nostril as directed daily.   ? levothyroxine (SYNTHROID) 25 mcg tablet Take 25 mcg by mouth every morning.   ? montelukast (SINGULAIR) 10 mg tablet Take 10 mg by mouth every morning.   ? other medication 1 Dose. Multivitamin with 2000 units Vitamin D and 100mg  CoQ 10   ? pantoprazole DR (PROTONIX) 40 mg tablet Take one tablet by mouth twice daily. Starting 1 week prior to procedure and for 1 month post procedure.   ? potassium chloride SR (K-DUR) 20 mEq tablet Take one tablet by mouth daily. Take with a meal and a full glass of water.   ? rivaroxaban (XARELTO) 20 mg tablet Take one tablet by mouth daily with dinner. Take with food.   ? senna (SENOKOT) 8.6 mg tablet Take one tablet by mouth daily.   ? tiotropium bromide (SPIRIVA RESPIMAT) 2.5 mcg/actuation inhaler Inhale 2 puffs by mouth into the lungs daily.   ? valsartan (DIOVAN) 80 mg tablet Take 80 mg by mouth twice daily.   ? zolpidem (AMBIEN) 5 mg tablet Take 2.5 mg by mouth nightly as  needed.     Vitals:    02/01/21 1307 02/01/21 1316   BP: (!) 155/73 (!) 168/88   BP Source: Arm, Right Lower Arm, Right Lower   Patient Position: Sitting Standing   Pulse: 69    Resp: 18    Temp: 36.9 ?C (98.5 ?F)    SpO2: 98%    Weight: 111.4 kg (245 lb 9.6 oz)    Height: 165.1 cm (5' 5)    PainSc: Zero      Body mass index is 40.87 kg/m?Marland Kitchen     Labs and Diagnostic Test:    Basic Labs:  Basic Labs Latest Ref Rng & Units 02/01/2021 11/09/2020   NA 137 - 147 MMOL/L 142 141   K 3.5 - 5.1 MMOL/L 3.8 4.1   CL 98 - 110 MMOL/L 107 106   BUN 7 - 25 MG/DL 14 17   CR 0.4 - 1.61 MG/DL 0.96 0.45   GLUX 70 - 409 MG/DL 811(B) 147(W)   CA 8.5 - 10.6 MG/DL 9.6 9.1   TP 6.0 - 8.0 G/DL 6.8 6.5   TBILI 0.3 - 1.2 MG/DL 0.6 0.8   ALB 3.5 - 5.0 G/DL 4.2 4.1   ALKP 25 - 295 U/L 43 55   AST 7 - 40 U/L 20 16   ALT 7 - 56 U/L 25 26   GFR >60 mL/min - >60   GFRAA >60 mL/min - >60   GAP 3 - 12 7 11    HBA1C 4.0 - 6.0 % - -   TSH 0.35 - 5.00 MCU/ML - -   WBC 4.5 - 11.0 K/UL 8.2 9.7   RBC 4.0 - 5.0 M/UL 4.78 5.24(H)   HGB 12.0 - 15.0 GM/DL 62.1 30.8   MCV 80 - 657 FL 88.9 87.6   MCH 26 - 34 PG 29.3 28.4   MCHC 32.0 - 36.0 G/DL 84.6 96.2   PLT 952 - 841 K/UL 216 205   MPV 7 - 11 FL 8.6 8.4   RDW 11 - 15 % 14.9 15.8(H)   NEUT 41 - 77 % 58 57   ANC 1.8 - 7.0 K/UL 4.70 5.60   LYMA 24 - 44 % 23(L) 26   ALYM 1.0 - 4.8 K/UL 1.90 2.60   MONA 4 - 12 % 15(H) 12   AMONO 0 - 0.80 K/UL 1.30(H) 1.10(H)   AEOS 0 - 0.45 K/UL 0.30 0.30   BASA 0 - 2 % 1 1   ABAS 0 - 0.20 K/UL 0.10 0.10   INR 0.8 - 1.2 - -         Imaging:    NM PET NEUROENDOCRINE     Radiopharmaceutical: 4.3 mCi Cu-64 ([employer])     Clinical Indication: Neuroendocrine tumor.     Technique: PET imaging was performed from skull to thighs. Low dose   non-contrast CT imaging was performed for attenuation correction and   localization purposes.     Comparison: Neuroendocrine PET CT 11/09/2020     FINDINGS:     Head/Neck: No tracer avid lesion(s).     Chest: No tracer avid lesion(s).     Abdomen/Pelvis: No significant change in a small tracer avid pancreatic   head lesion demonstrating a maximum SUV of 32.3 (CT image 164), previously   32.0.     No additional tracer avid abdominopelvic lesions.     Osseous Structures: No tracer avid lesion(s).     Additional CT findings: Prior partial right nephrectomy. At least  mild   coronary artery calcification. Cholecystectomy.     IMPRESSION     ?   1. Unchanged tracer avid pancreatic avid lesion compatible with reported   primary pancreatic neuroendocrine tumor. (Krenning score 3)     2. No tracer avid metastatic disease.     Physical Exam     Gen: alert, awake, NAD  HEENT: ATNC  CV: RRR  Resp: normal effort, no accessory muscle use, on RA  Abd: obese, soft, non-distended, non-tender diffusely. Previous surgical incisions well healed  MSK: moves all extremities, no edema  Neuro: no focal deficits, CN 2-12 grossly intact  Skin: intact, warm, dry  Psych: cooperative, appropriate mood    Assessment and Plan:    25F with hx A-fib s/p ablation on Xarelto, LA thrombus, HTN, COPD, hypothyroid, RCC s/p partial R nephrectomy now with 7 mm pancreatic head neuroendocrine tumor    Discussed management options including surgical resection with a Whipple versus ongoing surveillance. Given small size of 7 mm, stability on imaging, and lack of symptoms, the patient wishes to consider surveillance imaging and monitoring with medical oncology and Dr. Roselle Locus. Discussed the details, typical hospital stay, and post-operative course following a Whipple should she require resection in the future. All questions were answered to her satisfaction. She can return to clinic as needed.    Patient seen and discussed with Dr. Cathie Beams, who directed plan of care    Tana Conch, DO  General Surgery PGY-2    I personally performed the key portions of the E/M visit, discussed case with the resident and concur with the documentation of the history, physical exam, assessment, and treatment plan.  I have edited this note where appropriate.    Jimmy Footman, MD PhD  Transplant Surgery  Department of Surgery    Total Time Today was 65 minutes in the following activities: pre-visit evaluation of patient prior to visit, assessment of imaging and laboratory values, evaluation of the patient during the visit, and discussion of the risks, benefits, and complications.

## 2021-02-03 ENCOUNTER — Encounter: Admit: 2021-02-03 | Discharge: 2021-02-03 | Payer: MEDICARE

## 2021-02-06 ENCOUNTER — Encounter: Admit: 2021-02-06 | Discharge: 2021-02-06 | Payer: MEDICARE

## 2021-02-06 NOTE — Progress Notes
Faxed 01/2021 clinic note to PCP's office (Dr.Wyant)

## 2021-02-07 ENCOUNTER — Encounter: Admit: 2021-02-07 | Discharge: 2021-02-07 | Payer: MEDICARE

## 2021-02-13 ENCOUNTER — Encounter: Admit: 2021-02-13 | Discharge: 2021-02-13 | Payer: MEDICARE

## 2021-02-23 ENCOUNTER — Encounter: Admit: 2021-02-23 | Discharge: 2021-02-23 | Payer: MEDICARE

## 2021-02-24 ENCOUNTER — Encounter: Admit: 2021-02-24 | Discharge: 2021-02-24 | Payer: MEDICARE

## 2021-03-15 ENCOUNTER — Encounter: Admit: 2021-03-15 | Discharge: 2021-03-15 | Payer: MEDICARE

## 2021-03-15 DIAGNOSIS — C7A8 Other malignant neuroendocrine tumors: Secondary | ICD-10-CM

## 2021-03-15 NOTE — Patient Instructions
Dr.Anwaar Saeed,  Medical Oncologist specializing in Gastrointestinal malignancies   Lori Barbosa, Nurse Practitioner   Lori Poteet and Lynnette Lawton, Clinical Nurse Coordinators    Phone: [phone]   Monday-Friday 8:00am- 4:00pm with the exception of Holidays  Fax: [phone]    After Hours Phone: [phone]- On call number for urgent needs outside of business hours ask for the On call Oncology fellow to be paged and they will call you back    Medication refills: please contact your pharmacy for medication refills, if they do not have any refills on file they will contact the office, make sure they have our correct contact information on file   P: [phone], F: [phone]    MyChart Messages: All mychart messages are answered by the nurses, even if you select to send the message to Dr.Saeed or Lori. We often communicate with Dr.Saeed or Lori regarding how to answer these messages. Messages are answered 8:00am-4:00pm Monday-Friday with the exception of Holidays.    Phone Calls: We are typically not at our desks where our phones are located as we are in clinic. Please leave us a message as we check our messages several times per day. It is our goal to answer these messages as soon as possible. Messages are answered from 8:00am-4:00 pm Monday-Friday with the exception of Holidays. Please make sure you leave your full name with the spelling, date of birth, and reason for your call when leaving a message.

## 2021-03-15 NOTE — Progress Notes
Name: Shelby Rogers          MRN: [id number]      DOB: 07/29/1951      AGE: 70 y.o.   DATE OF SERVICE: 03/15/2021    Subjective:             Reason for Visit:  Cancer    Obtained patient's verbal consent to treat them and their agreement to [facility] financial policy and NPP via this telehealth visit during the [facility]      Shelby Rogers is a 70 y.o. female newly diagnosed with neuroendocrine cancer.    Cancer Staging  No matching staging information was found for the patient.    History of Present Illness  Ms.Remedies is a 70 y.o. 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 She is status post right robotic partial nephrectomy with negative margins 04/25/15. Surveillance CT scans were completed in 2017 which incidentally found a subcentimeter nodule on the head of the pancreas. She had an EUS 09/25/18 revealed a 7 mm round hypoechoic lesion. This was surrounded by blood vessels. FNA was completed which revealed well-differentiated neuroendocrine tumor. MRI 10/15/18 did not reveal a pancreatic lesion. She had a CT 08/23/19 that revealed a stable, small, arterial phase hyperenhancing nodule within the pancreatic head. The size was stable compared to previous imaging. An EUS was repeated 12/07/19 which revealed an 8 mm hypoechoic mass on the head of the pancreas. Due to the blood vessels around the lesion it was difficult to perform FNA. CT scan 01/25/20 did not identify a pancreatic lesion. She met with Dr.Kumer 02/03/20, whipple surgery was offered but she declined at this time. She is here today to establish care.       Review of Systems   Constitutional: Negative for activity change, appetite change, chills, fatigue, fever and unexpected weight change.   HENT: Negative for congestion, rhinorrhea and sinus pressure.    Eyes: Negative for visual disturbance.   Respiratory: Negative for cough, shortness of breath and wheezing.    Cardiovascular: Positive for palpitations. Negative for chest pain.   Gastrointestinal: Negative for abdominal distention, abdominal pain, blood in stool, constipation, diarrhea and nausea.   Genitourinary: Negative for dysuria, frequency and urgency.   Musculoskeletal: Negative for arthralgias and myalgias.   Skin: Negative for color change and rash.   Neurological: Negative for weakness and headaches.   Psychiatric/Behavioral: The patient is not nervous/anxious.          Objective:         ? albuterol sulfate (PROAIR HFA) 90 mcg/actuation HFA aerosol inhaler Inhale 2 puffs by mouth into the lungs every 6 hours as needed for Wheezing or Shortness of Breath. Shake well before use.   ? albuterol-ipratropium (DUONEB) 0.5 mg-3 mg(2.5 mg base)/3 mL nebulizer solution Inhale 3 mL solution by nebulizer as directed four times daily as needed for Wheezing.   ? dilTIAZem CD (CARDIZEM CD) 120 mg capsule Take 120 mg by mouth at bedtime daily.   ? dilTIAZem LA (CARDIZEM LA) 180 mg tablet 180 mg daily. Indications: takes 180mg  in AM   ? famotidine (PEPCID) 20 mg tablet Take 20 mg by mouth twice daily.   ? fexofenadine (ALLEGRA) 180 mg tablet Take 180 mg by mouth daily.   ? fish oil /omega-3 fatty acids (SEA-OMEGA) 340/1000 mg capsule Take 1 capsule by mouth daily.   ? flecainide (TAMBOCOR) 150 mg tablet Take  one-half tablet by mouth twice daily.   ? fluticasone (FLONASE) 50 mcg/actuation nasal spray Apply 2 Sprays to each nostril as directed daily.   ? levothyroxine (SYNTHROID) 25 mcg tablet Take 25 mcg by mouth every morning.   ? montelukast (SINGULAIR) 10 mg tablet Take 10 mg by mouth every morning.   ? ondansetron HCL (ZOFRAN) 4 mg tablet    ? ondansetron HCL (ZOFRAN) 8 mg tablet Take 8 mg by mouth every 8 hours as needed.   ? other medication 1 Dose. Multivitamin with 2000 units Vitamin D and 100mg  CoQ 10   ? pantoprazole DR (PROTONIX) 40 mg tablet Take one tablet by mouth twice daily. Starting 1 week prior to procedure and for 1 month post procedure.   ? potassium chloride SR (K-DUR) 20 mEq tablet Take one tablet by mouth daily. Take with a meal and a full glass of water.   ? rivaroxaban (XARELTO) 20 mg tablet Take one tablet by mouth daily with dinner. Take with food.   ? senna (SENOKOT) 8.6 mg tablet Take one tablet by mouth daily.   ? tiotropium bromide (SPIRIVA RESPIMAT) 2.5 mcg/actuation inhaler Inhale 2 puffs by mouth into the lungs daily.   ? valsartan (DIOVAN) 80 mg tablet Take 80 mg by mouth twice daily.   ? zolpidem (AMBIEN) 5 mg tablet Take 2.5 mg by mouth nightly as needed.     Vitals:    03/15/21 1510   PainSc: Zero     There is no height or weight on file to calculate BMI.     Pain Score: Zero       Fatigue Scale: 0-None    Pain Addressed:  N/A    Patient Evaluated for a Clinical Trial: No treatment clinical trial available for this patient.     [place] Cooperative Oncology Group performance status is 0, Fully active, able to carry on all pre-disease performance without restriction.Marland Kitchen     Physical Exam     CBC w diff    Lab Results   Component Value Date/Time    WBC 8.2 02/01/2021 08:47 AM    RBC 4.78 02/01/2021 08:47 AM    HGB 14.0 02/01/2021 08:47 AM    HCT 42.5 02/01/2021 08:47 AM    MCV 88.9 02/01/2021 08:47 AM    MCH 29.3 02/01/2021 08:47 AM    MCHC 32.9 02/01/2021 08:47 AM    RDW 14.9 02/01/2021 08:47 AM    PLTCT 216 02/01/2021 08:47 AM    MPV 8.6 02/01/2021 08:47 AM    Lab Results   Component Value Date/Time    NEUT 58 02/01/2021 08:47 AM    ANC 4.70 02/01/2021 08:47 AM    LYMA 23 (L) 02/01/2021 08:47 AM    ALC 1.90 02/01/2021 08:47 AM    MONA 15 (H) 02/01/2021 08:47 AM    AMC 1.30 (H) 02/01/2021 08:47 AM    EOSA 3 02/01/2021 08:47 AM    AEC 0.30 02/01/2021 08:47 AM    BASA 1 02/01/2021 08:47 AM    ABC 0.10 02/01/2021 08:47 AM        Comprehensive Metabolic Profile    Lab Results   Component Value Date/Time    NA 142 02/01/2021 08:47 AM    K 3.8 02/01/2021 08:47 AM    CL 107 02/01/2021 08:47 AM    CO2 28 02/01/2021 08:47 AM    GAP 7 02/01/2021 08:47 AM    BUN 14 02/01/2021 08:47 AM    CR 0.84 02/01/2021 08:47  AM    GLU 109 (H) 02/01/2021 08:47 AM    Lab Results   Component Value Date/Time    CA 9.6 02/01/2021 08:47 AM    ALBUMIN 4.2 02/01/2021 08:47 AM    TOTPROT 6.8 02/01/2021 08:47 AM    ALKPHOS 43 02/01/2021 08:47 AM    AST 20 02/01/2021 08:47 AM    ALT 25 02/01/2021 08:47 AM    TOTBILI 0.6 02/01/2021 08:47 AM    GFR >60 11/09/2020 02:21 PM    GFRAA >60 11/09/2020 02:21 PM             Assessment and Plan:  1- Well-differentiated neuroendocrine cancer  Surveillance CT scans were completed in 2017 which incidentally found a subcentimeter nodule on the head of the pancreas. She had an EUS 09/25/18 revealed a 7 mm round hypoechoic lesion. This was surrounded by blood vessels. FNA was completed which revealed well-differentiated neuroendocrine tumor. MRI 10/15/18 did not reveal a pancreatic lesion. She had a CT 08/23/19 that revealed a stable, small, arterial phase hyperenhancing nodule within the pancreatic head. The size was stable compared to previous imaging. An EUS was repeated 12/07/19 which revealed an 8 mm hypoechoic mass on the head of the pancreas. Due to the blood vessels around the lesion it was difficult to perform FNA. CT scan 01/25/20 did not identify a pancreatic lesion. She met with Dr.Kumer 02/03/20, whipple surgery was offered but she declined at this time.     Plan:  -Discussed her neuroendocrine cancer appears resectable based on her EUS findings and no metastatic disease present on imaging. She does not want to proceed with surgery at this time.  -Will continue to monitor the size of her pancreatic lesion. Return in 3 months with lab, office visit, and CT scans    06/22/20:   Labs reviewed   Scans (dotatate) reviewed - no evidence of progression or metastatic disease   She is scheduled for cardiac ablation procedure on 07/19/20. Per her cardiologist, she will need to be on anticoagulation for at least 3 months.   Discussed delaying her pancreatic surgery 3 months. RTC in 3 months with labs and follow up scans (dotatate scan)     11/09/20:   Reviewed current dotatate scan showing activity over the primary tumor in the pancreas with no evidence of metastatic disease.   She is s/p cardiac ablation procedure and is ready now to see Dr Lucianne Muss to discuss surgery    Referral placed to see Dr Cathie Beams within the next 2-3 weeks to discuss surgery     02/01/21:   Labs reviewed   Dotatate scan completed earlier today - awaiting radiology read   She is scheduled to meet with Dr Cathie Beams today to discuss surgery   -Discussed that based on surgical outcome, she may not need sandostatin     03/15/21:   Surgery was not recommended after discussion with Dr Cathie Beams   Discussed monthly sandostatin and she opted to just do watchful waiting with dotatate scans and labs every 3-4 months     RTC in July with labs and dotatate scan       2- Palpitations  -Follows with cardiology at [facility] in [place]. She has history of atrial fibrillation.   -s/p ablation for atrial fibrillation       The above assessment and recommendations were discussed with the patient/ family today and they are in agreement with the plan.  Their questions and concerns were addressed to the best of their satisfaction. Patient/family  has/have the phone numbers for the Cancer Center and was/were instructed on how to contact us with any questions or concerns.

## 2021-03-20 ENCOUNTER — Encounter: Admit: 2021-03-20 | Discharge: 2021-03-20 | Payer: MEDICARE

## 2021-05-13 ENCOUNTER — Encounter: Admit: 2021-05-13 | Discharge: 2021-05-13 | Payer: MEDICARE

## 2021-05-17 IMAGING — US CA echo stress exercise
2 series · 13 of 16 positions shown · non-contrast
Comparison: none

Procedure(s): CA echo stress exercise

Stress Echocardiogram Report
Indications: Chest pain, hypertension
Technical aspects: Adequate
M-mode (the following are in centimeters): Left ventricular end-diastolic 4.7, left ventricular end
systolic 3.1, interventricular septal width 0.9, left ventricular posterior width 0.9, left atrium
3.8, aortic root
2d and Doppler
Left ventricle  : Normal size, mild concentric left ventricular hypertrophy ejection fraction of 60%
no evidence of regional wall motion abnormality relaxation anomaly present
Mitral valve  : No evidence of prolapse, stenosis, trivial regurgitation noted
Left Atrium  : Mildly dilated
Aortic valve  : No evidence of sclerosis, stenosis, insufficiency
Aortic Root  : Normal size
Right Ventricle : Normal size normal function
Tricuspid valve : Trace to mild tricuspid regurgitation with estimated pulmonary artery systolic
pressures are 25-30 mmHg (in the assumption of normal central venous pressure)
Right atrium : Normal size
Pericardium : No evidence of pericardial disease
Inferior vena cava : Not well seen

[Series 1: ca echo stress exercise · 21 acquisitions, 11 frames shown (1 of 2)]
[im 1/21]
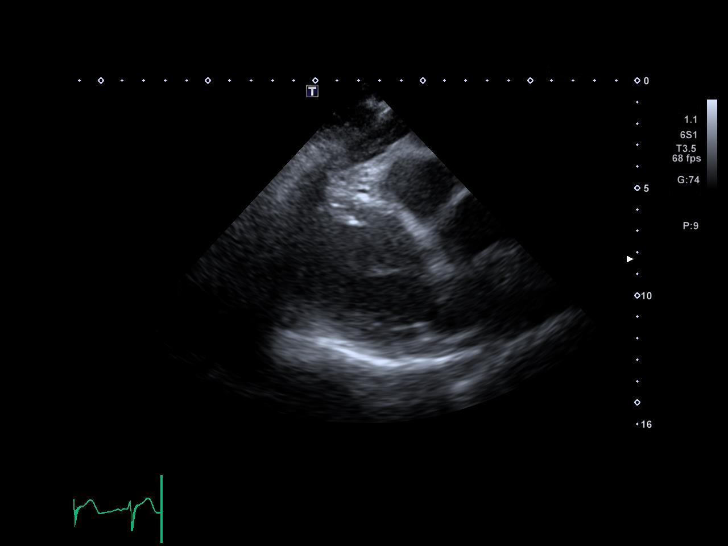
[im 2/21]
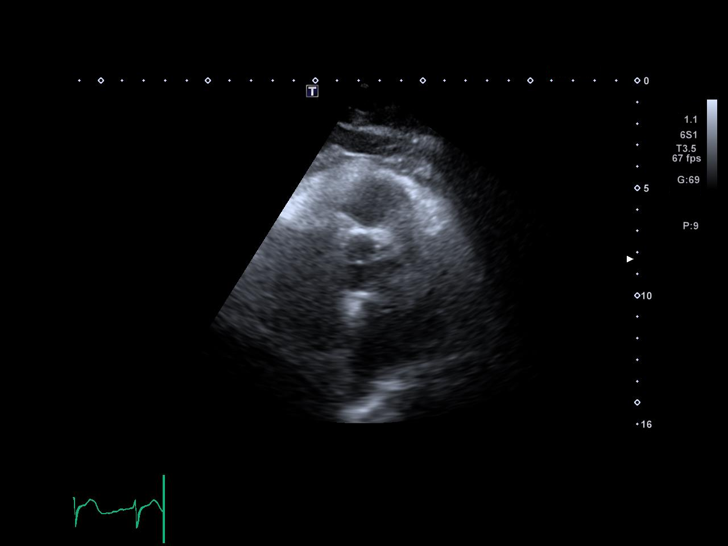
[im 6/21]
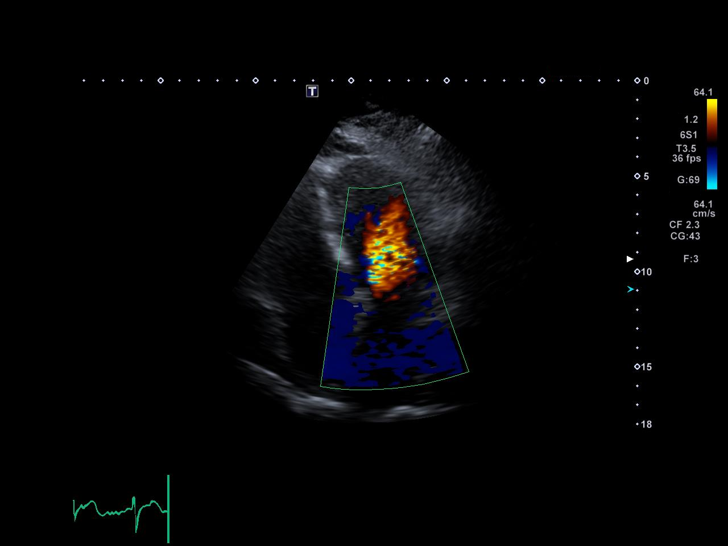
[im 7/21]
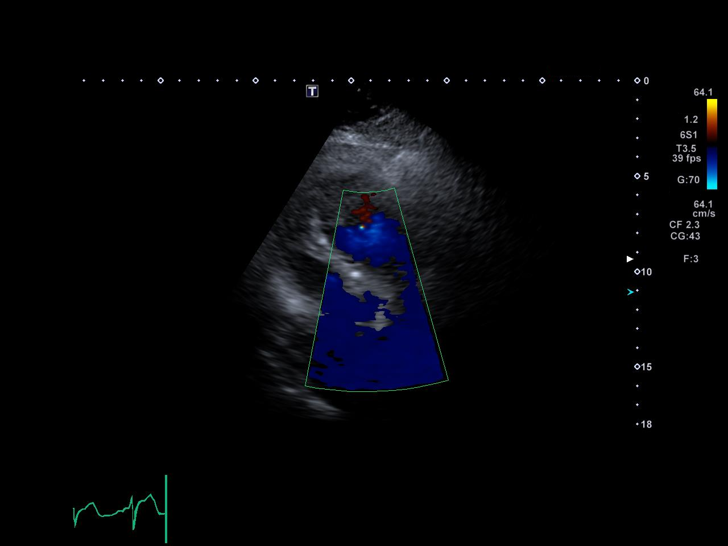
[im 9/21]
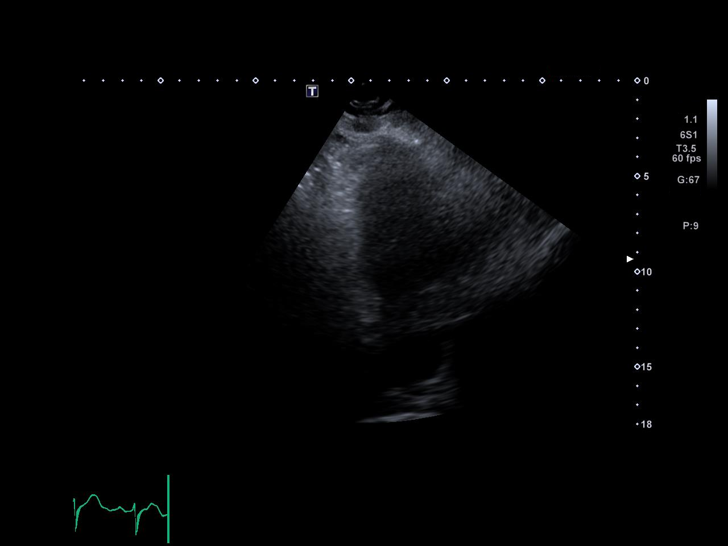
[im 11/21]
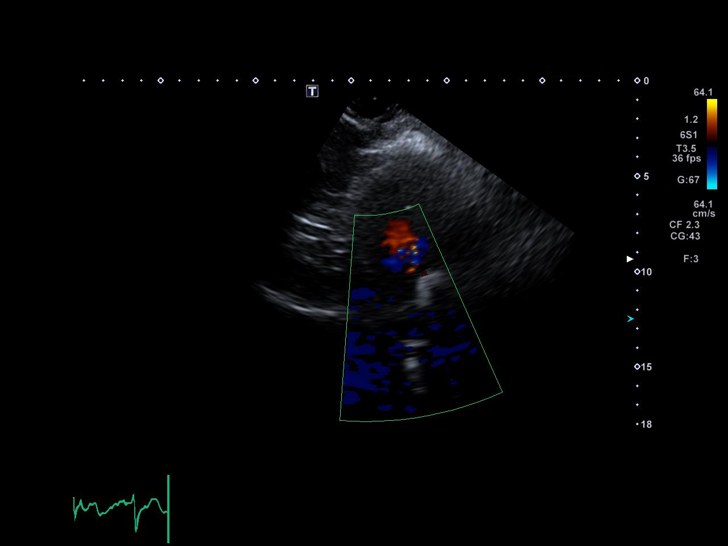
[im 14/21]
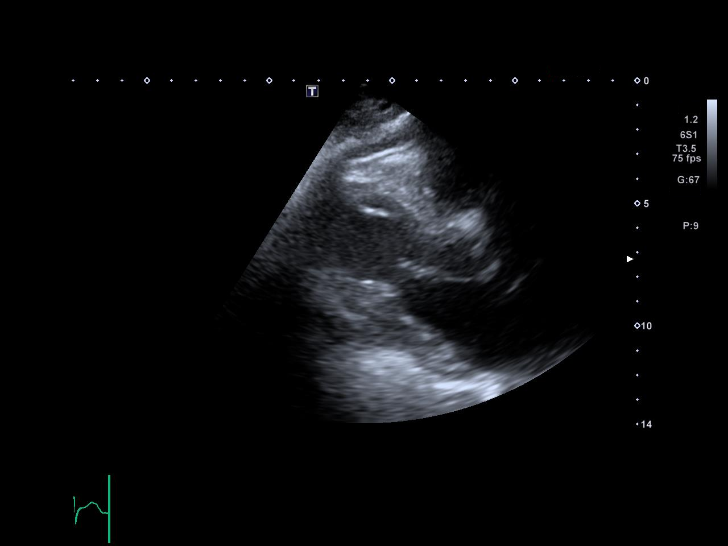
[im 16/21]
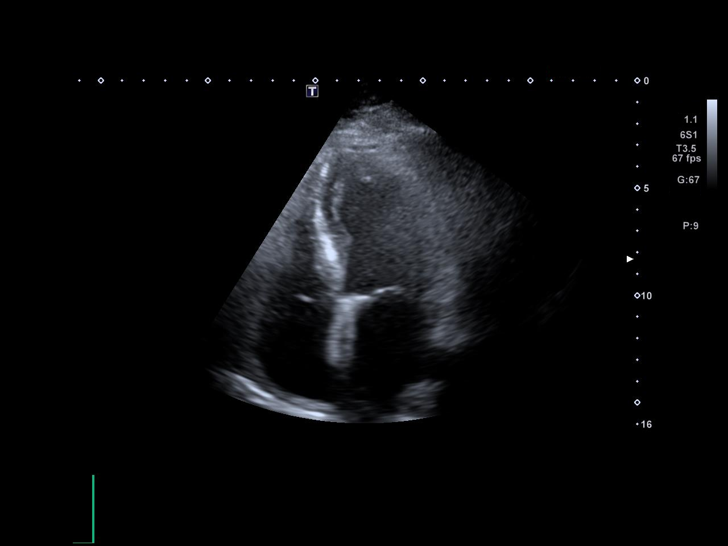
[im 17/21]
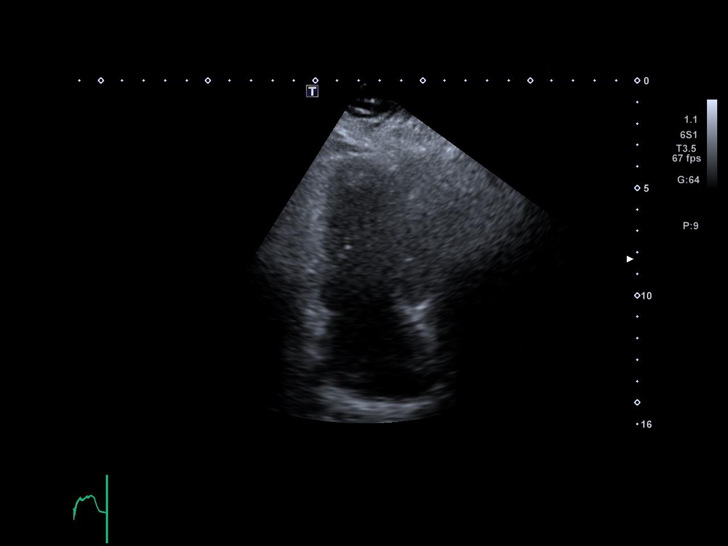
[im 19/21]
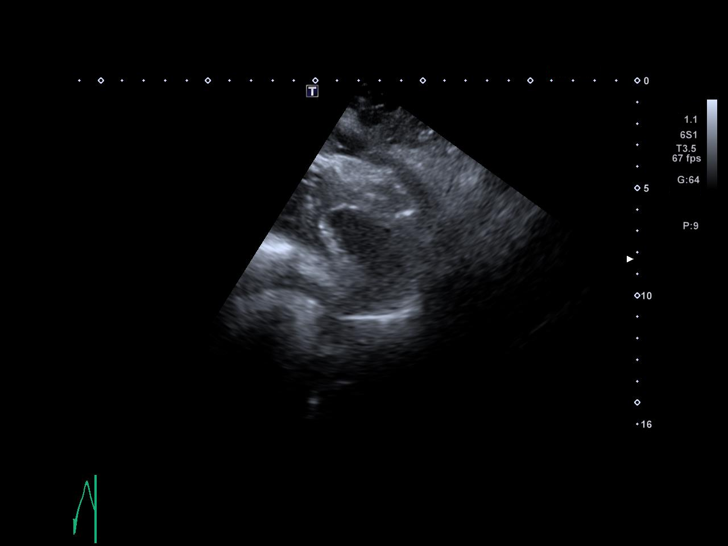
[im 21/21]
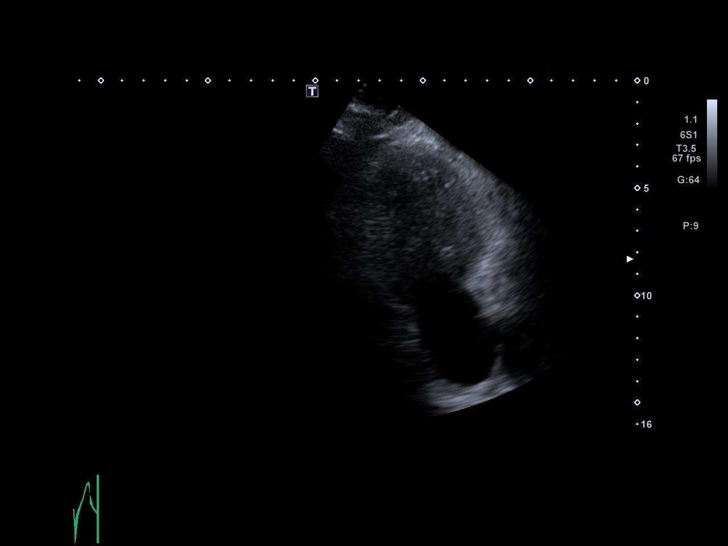

[Series 1: ca echo stress exercise · 2 of 5 slices shown (2 of 2)]
[im 3/5]
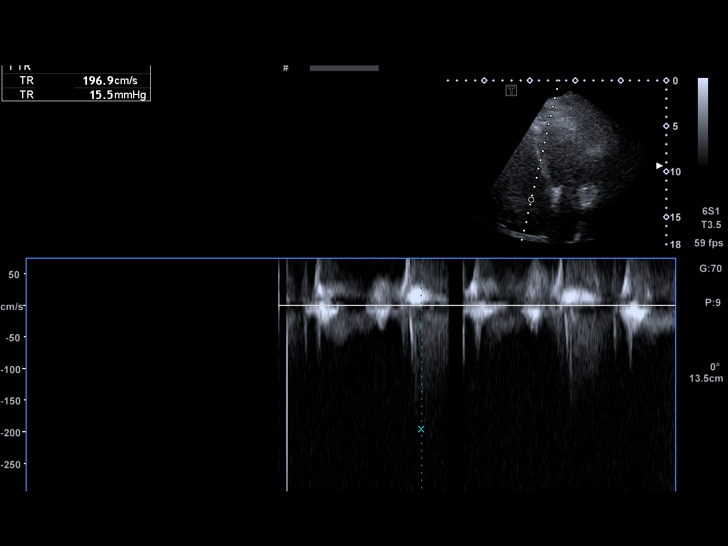
[im 5/5]
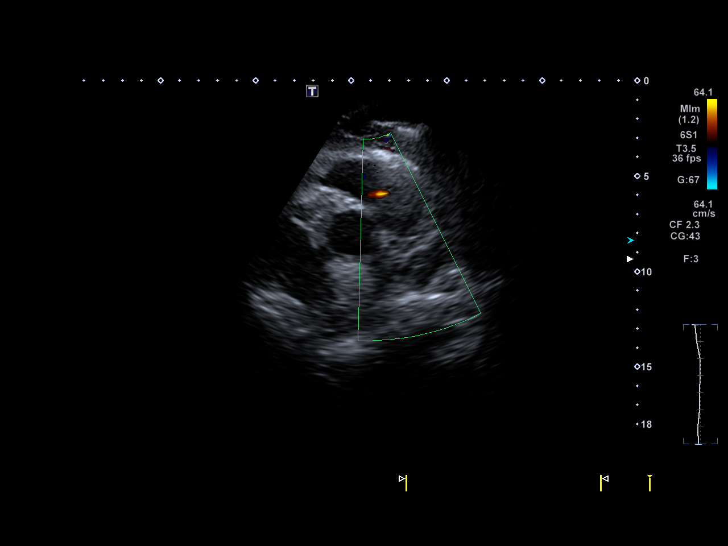

[13 of 16 positions shown; findings below may reference images not displayed]

SUMMARY: Normal left ventricular systolic function EF of 60% with no evidence of regional wall
motion abnormality, mild concentric left ventricular hypertrophy with mild relaxation disorder. Tri
vial mitral valve regurgitation, mild left atrium enlargement, no evidence of aortic valve disease,
normal aortic crew size, normal right ventricular size and function, trivial to mild tricuspid
regurgitation with normal pulmonary pressures, no evidence of pericardial disease.

Stress echo: Patient exercised for a total of 11 minutes 33 seconds using a modified Bruce protocol
achieving maximal heart rate of 122 beats per minute which is 80% of the maximum predicted patient
with fair amount of exercise tolerance with a total of 7 METS of activity performed. Test was
stopped secondary to fatigue and shortness of breath. Initial blood pressure 152/88 mmHg with
maximum blood pressure of 220/92 mmHg and peak exercise final blood pressure 122/54 mmHg. Baseline
EKG normal sinus rhythm normal EKG with exercise patient develop sinus tachycardia with no evidence
of ST deviation suggestive for ischemia or injury. Baseline ejection fraction of 60-65% with
exercise there was increased contractility of walls ejection fraction exceeding 75% with no evidence
of regional wall motion abnormality.

Summary stress echo: Suboptimal stress test as patient did not achieve 85% of the target heart rate.
Nonetheless, adequate for question at hand demonstrated no evidence of ischemia on basis of
symptoms, EKG changes or echocardiographic changes with evidence of hypertensive response with
exercise no evidence of dysrhythmia occur and during activity or during rest.

## 2021-05-24 IMAGING — US US carotid doppler [ID]
1 series · 13 of 16 positions shown · non-contrast
Comparison: none

Procedure(s): US carotid doppler [id number]

RADIOLOGIC EXAM:  BILATERAL CAROTID DUPLEX
INDICATION: T.I.A. dizziness

[Series 1: us carotid doppler (id) · 13 of 44 slices shown]
[im 1/44]
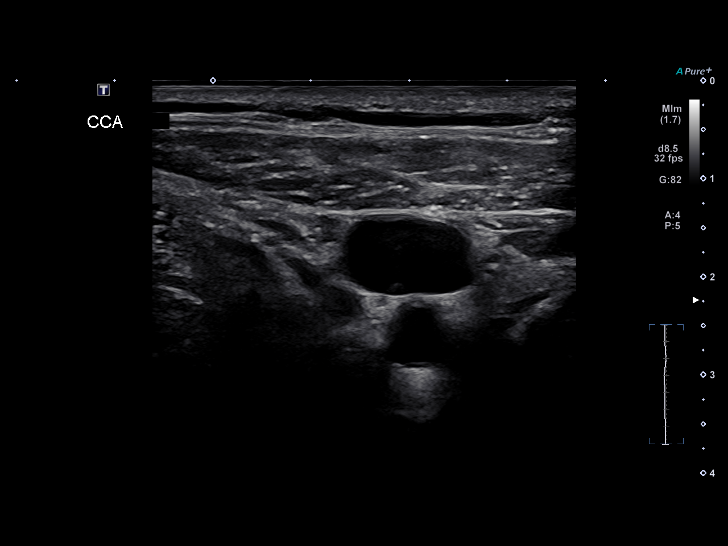
[im 3/44]
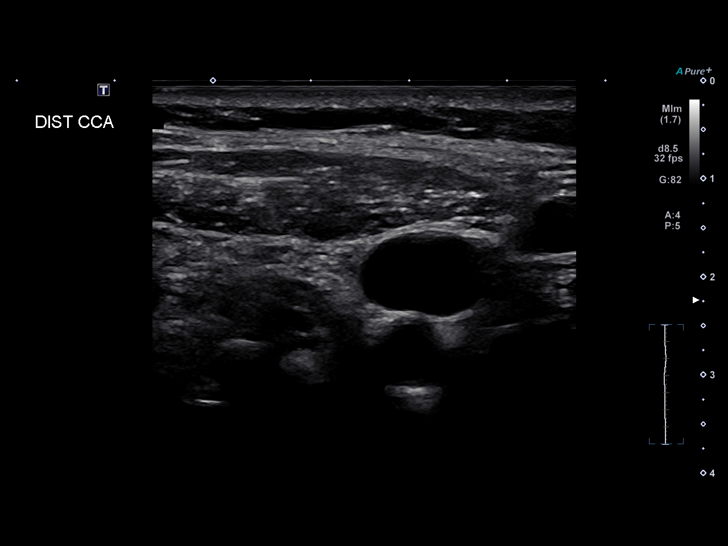
[im 9/44]
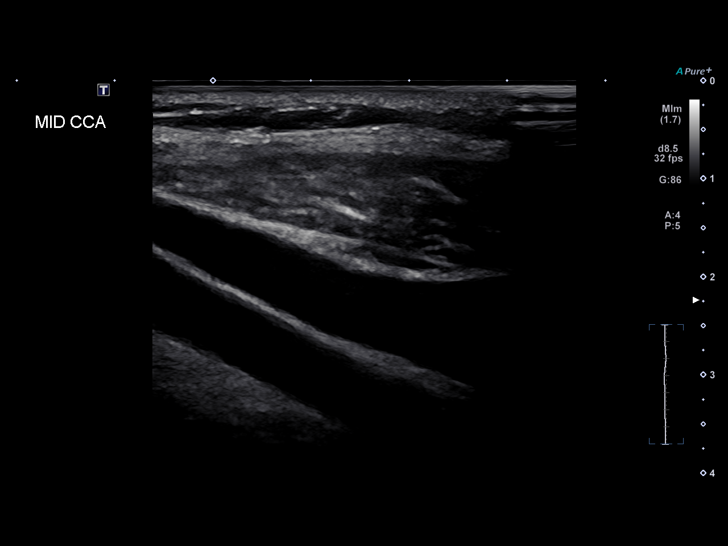
[im 12/44]
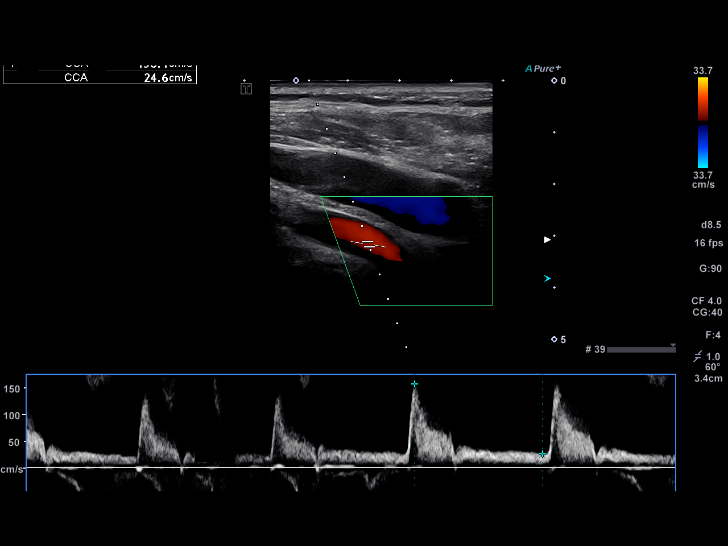
[im 15/44]
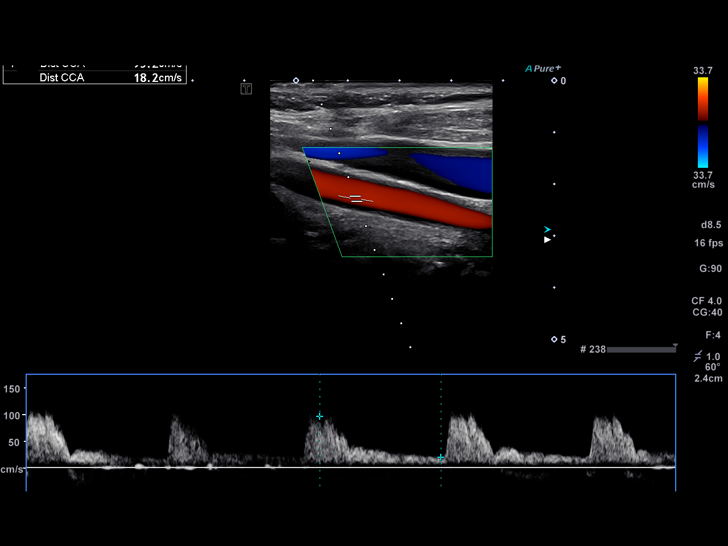
[im 18/44]
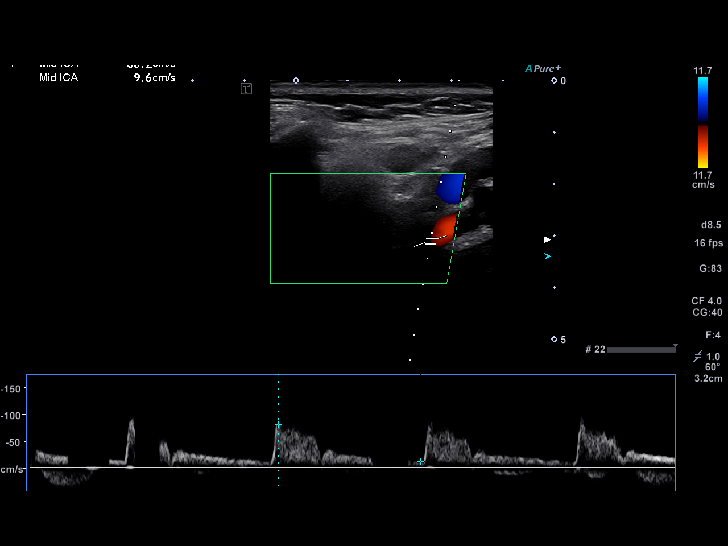
[im 23/44]
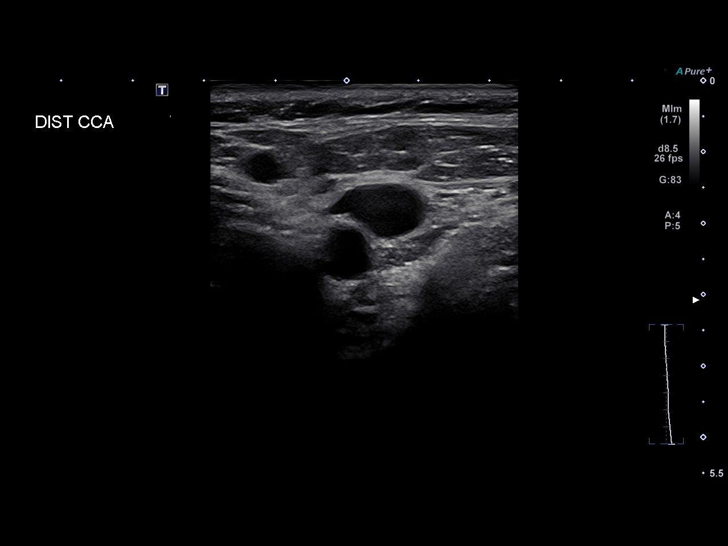
[im 26/44]
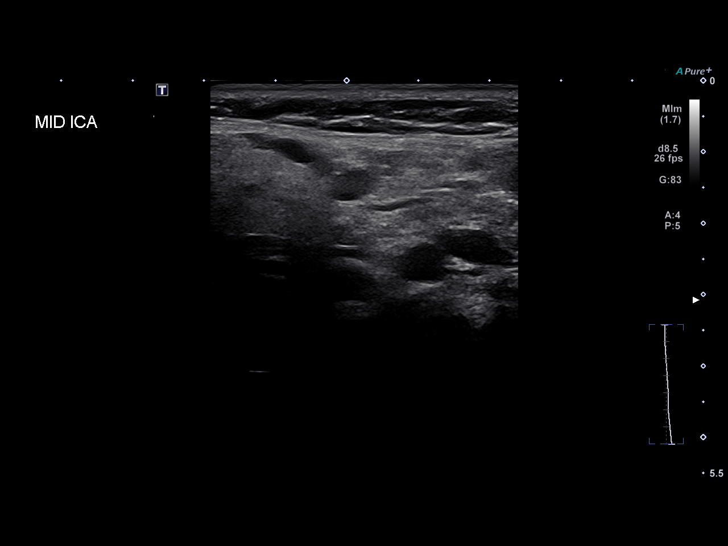
[im 29/44]
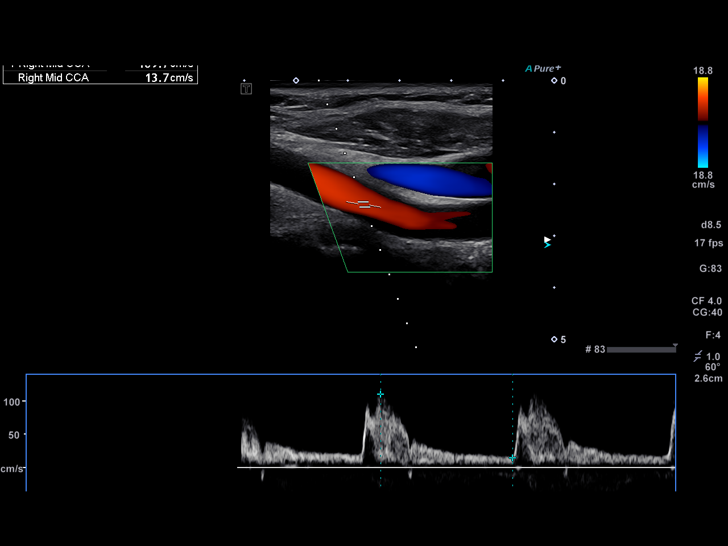
[im 32/44]
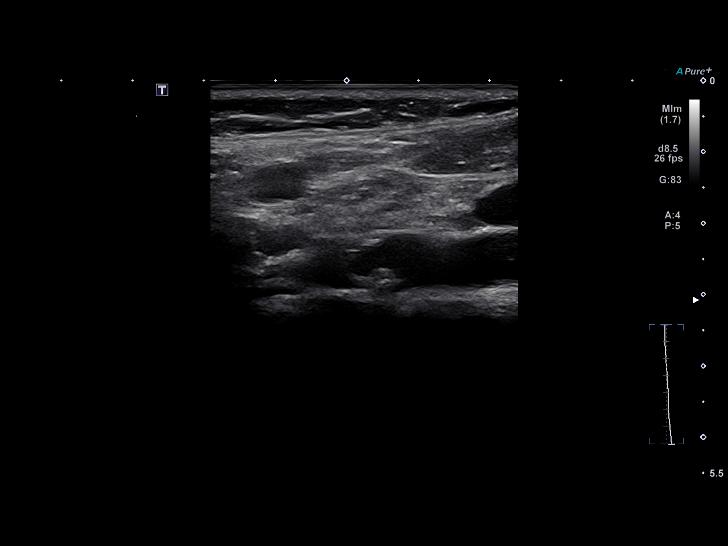
[im 35/44]
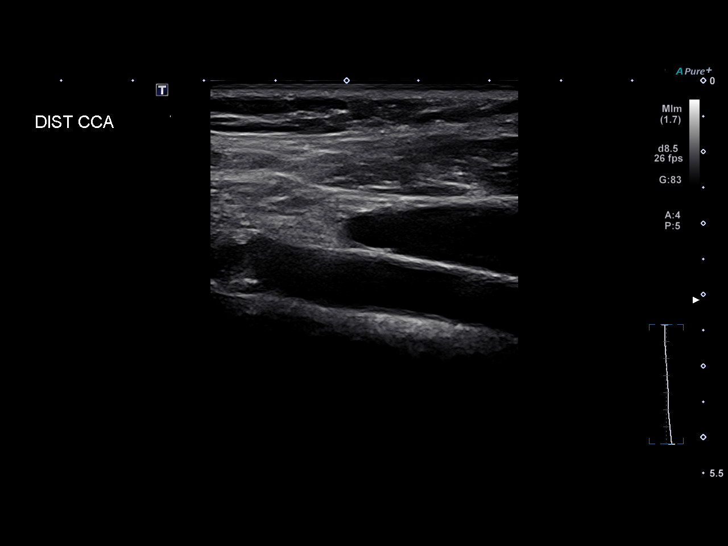
[im 41/44]
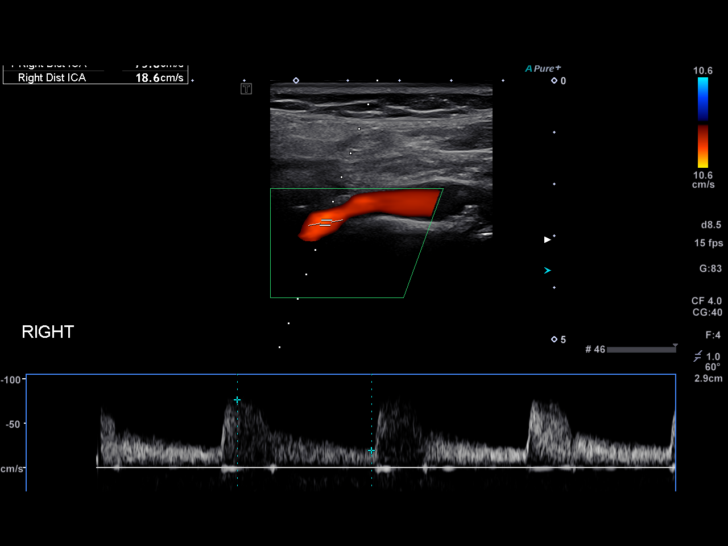
[im 44/44]
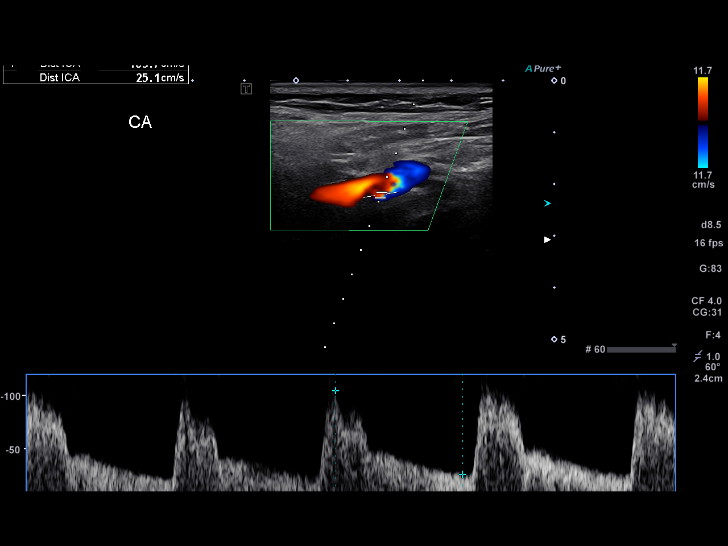

[13 of 16 positions shown; findings below may reference images not displayed]

Doppler Measurements (centimeters per second):

RIGHT:
Peak LLZ-CYX, Peak UD3-UV, Diastolic PPH-A4, Peak 1YL-M6,
ICA/CCA Patio-Y.BR

LEFT:
Peak 66Z-M5A proximally and 95 distally, Peak YSJ-CS,
Diastolic T6Q-0, Peak 108-LR, ICA/CCA 8atio-W.SE

RIGHT CAROTID:  There is moderate plaque in the common
carotid, carotid bulb, internal and external carotid artery.
No significant stenosis in the internal carotid,  or common
carotid artery.

LEFT CAROTID:  There is moderate plaque  in the common
carotid, carotid bulb, internal and external carotid artery.
No significant stenosis in the internal carotid,  or common
carotid artery.

The left vertebral artery shows normal low resistance
antegrade flow.

The right vertebral artery shows abnormal waveform with to
and fro flow and high resistance.
IMPRESSION: No significant stenosis in the carotid arteries as detailed
above ( less than 60%).

Abnormal waveform in the right vertebral artery with 2 and
fro flow and high resistance. This can be related to a steal
phenomenon or more distal elevated resistance. If further
evaluation were indicated, CTA could be considered.

NASCET angiographic criteria used to determine percent
stenosis.

## 2021-06-06 IMAGING — CT AGNECK
4 of 6 series · 16 of 33 positions shown, 18 images · IV contrast (OptiRay 320)
Comparison: none

Procedure(s): CT angio neck

CT ANGIOGRAM OF THE NECK WITH CONTRAST:
HISTORY: TIA, dizziness.
TECHNIQUE: Multiple contiguous axial CT angiogram images of the neck
were obtained after 150 cc of Visipaque 320 contrast.
Coronal and sagittal reformatted images were acquired.
Three-dimensional reconstructions were acquired on a
separate viewing station. Automated exposure control (AEC)
dose reduction technique was utilized.

[Series 2: aice 2.0 ce · axial · 0.57mm/px · z∈[-753,-563]mm · 6 of 133 slices shown, 8 images]
[im 19/133  soft-tissue]
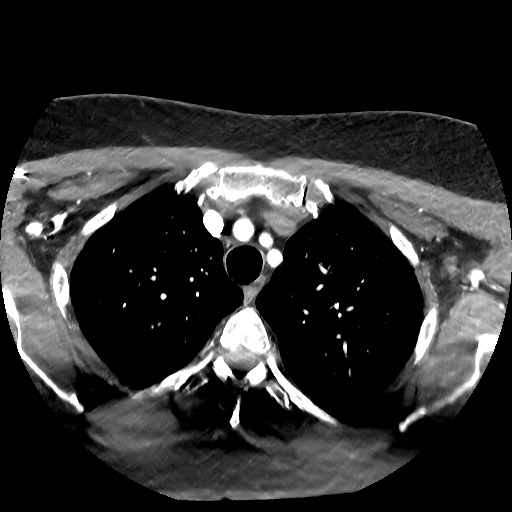
[im 19/133  bone]
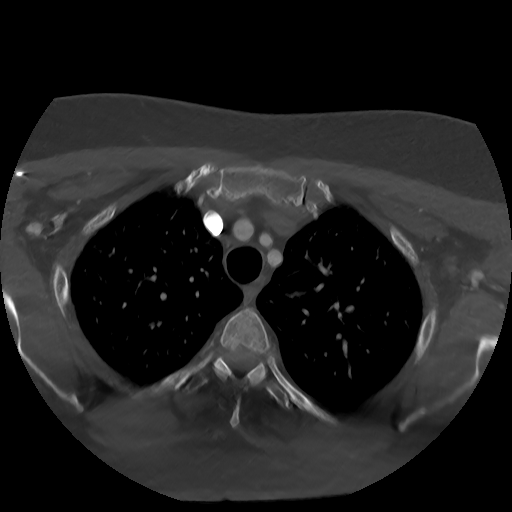
[im 38/133  bone]
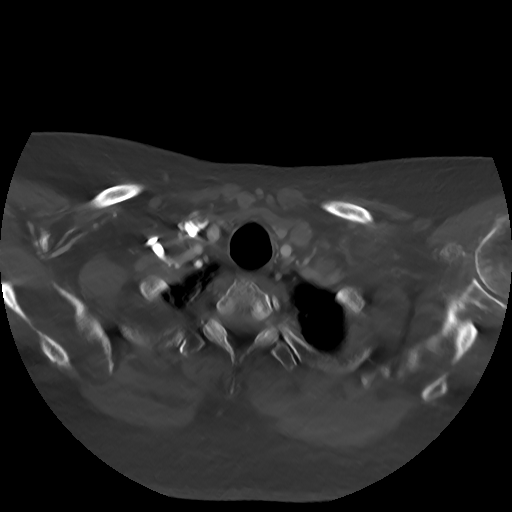
[im 57/133  bone]
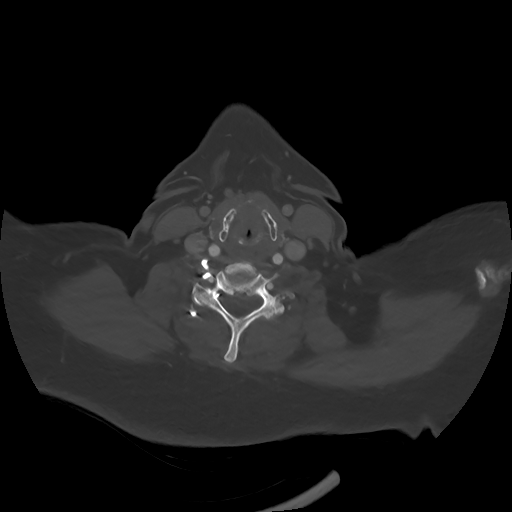
[im 76/133  bone]
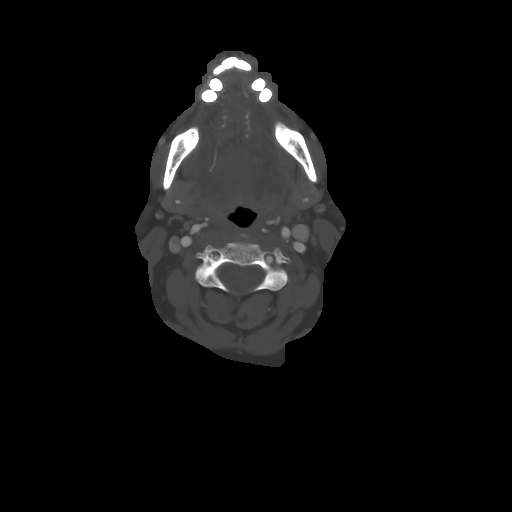
[im 95/133  soft-tissue]
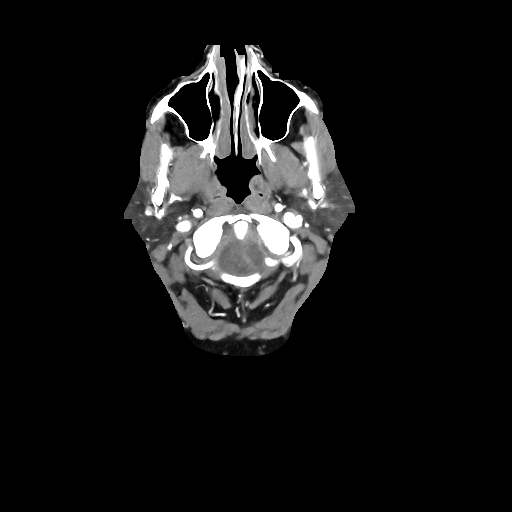
[im 95/133  bone]
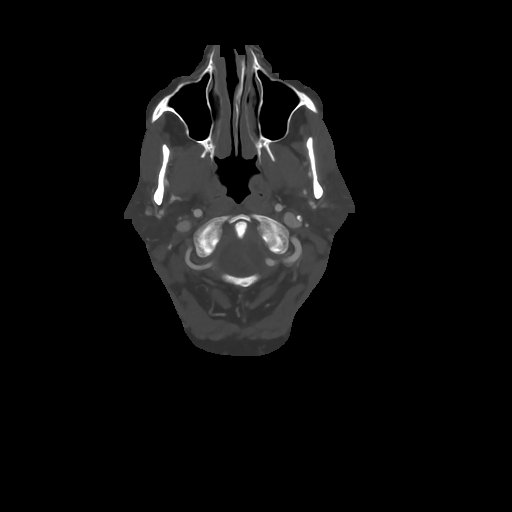
[im 114/133  bone]
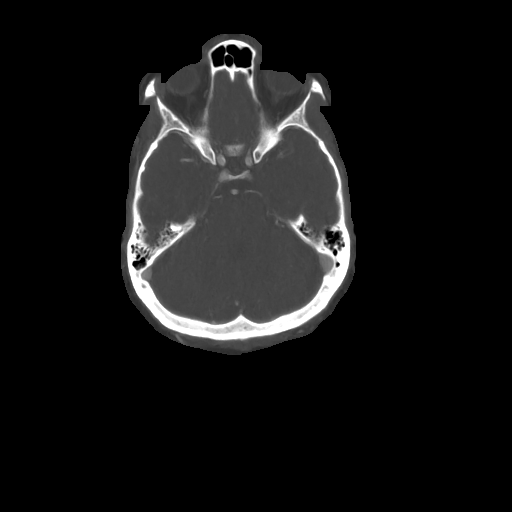

[Series 4: aice 10.000 ce · axial · 0.57mm/px · z∈[-734,-581]mm · 4 of 87 slices shown (1 of 2)]
[im 18/87  bone]
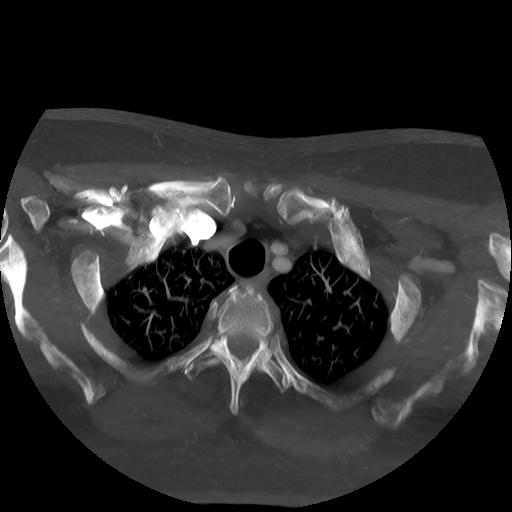
[im 35/87  bone]
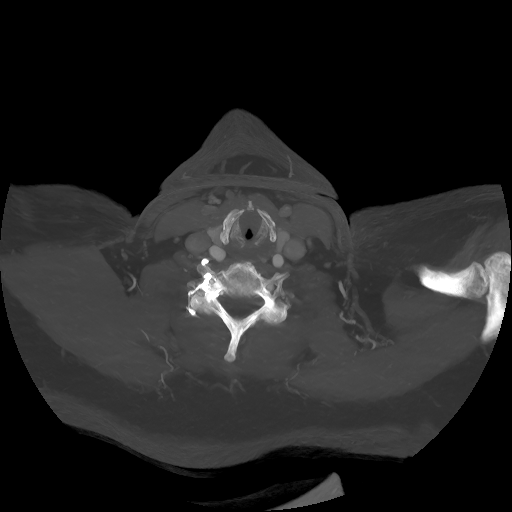
[im 52/87  bone]
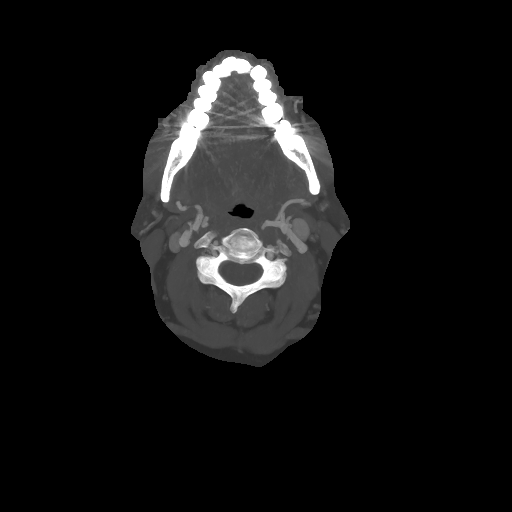
[im 69/87  bone]
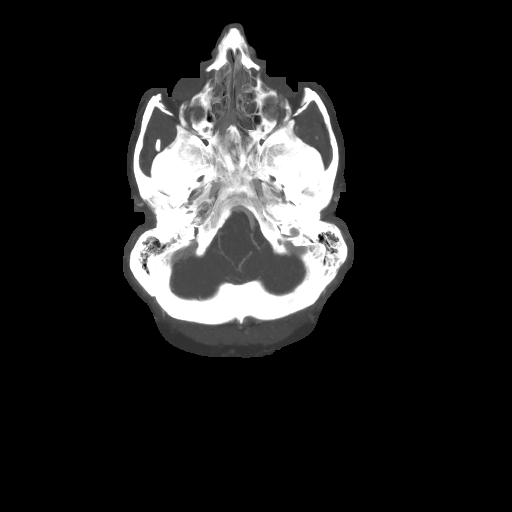

[Series 5: aice 10.000 ce · coronal · 0.57mm/px · 1 of 70 slices shown (2 of 2)]
[im 35/70  bone]
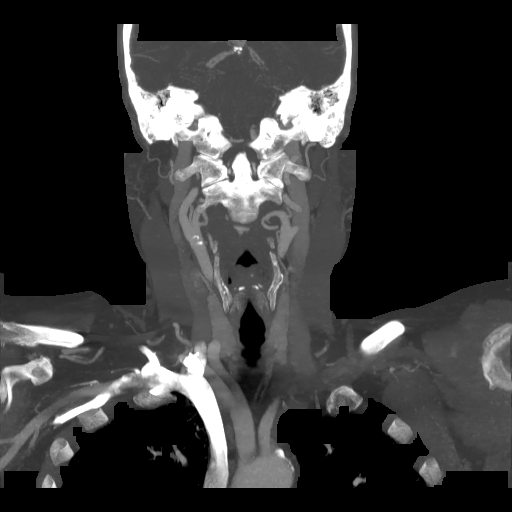

[Series 9: aice 2.000 ce · sagittal · 0.57mm/px · 5 of 78 slices shown]
[im 13/78  bone]
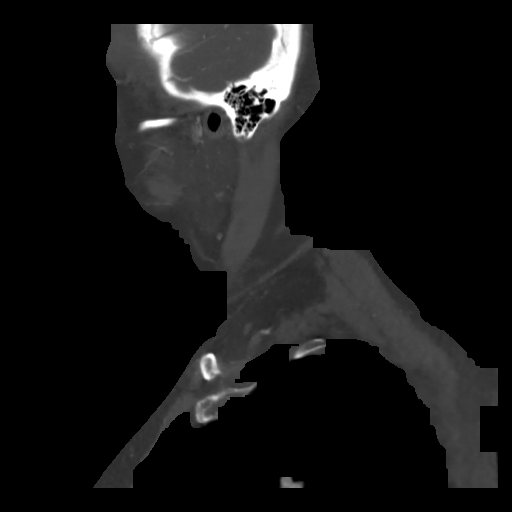
[im 26/78  bone]
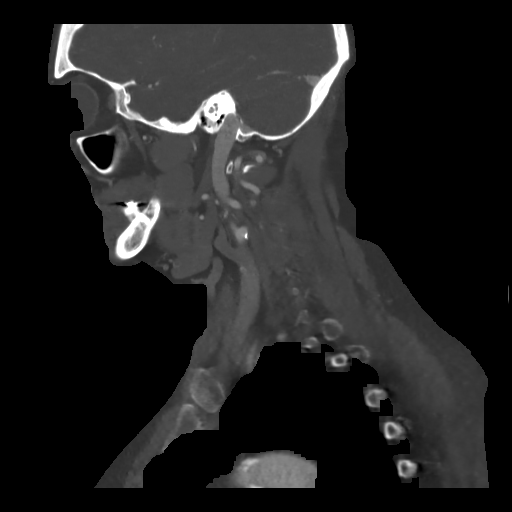
[im 39/78  bone]
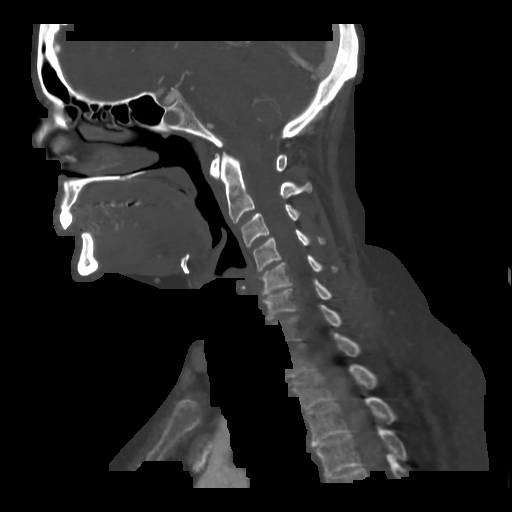
[im 52/78  bone]
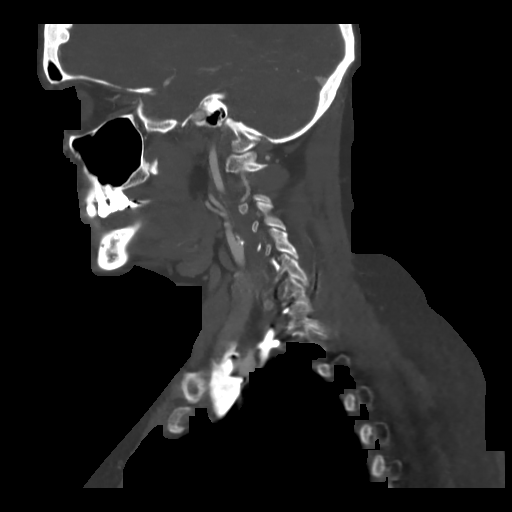
[im 65/78  bone]
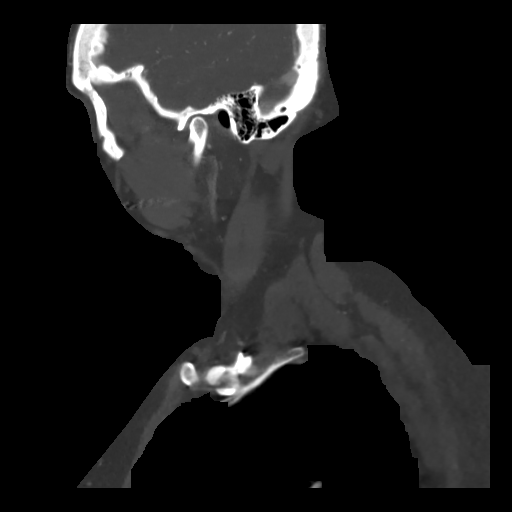

[16 of 33 positions shown; findings below may reference images not displayed]

FINDINGS: CT neck soft tissues:

The lung apices are clear. The airway is patent. The
nasopharynx, hypopharynx, larynx are normal. There is no
cervical lymphadenopathy. There is no solid or cystic neck
mass. The parotid glands, submandibular glands and thyroid
gland are grossly normal. The visualized paranasal sinuses
and mastoid air cells are clear. There is no acute cervical
spine abnormality.

CT angiography of the neck:

The origins of the 3 great vessels from the thoracic aortic
arch are patent and normal. Visualized portions of the
subclavian arteries are normal bilaterally. The vertebral
artery origins are patent bilaterally. There is no evidence
of vertebral artery dissection or occlusion. The left
vertebral artery is dominant, right vertebral artery is
tortuous. The distal vertebral arteries and basilar artery
are within normal limits.

The common carotid arteries are widely patent bilaterally.
The carotid bifurcations are normal bilaterally. The
proximal external carotid arteries are normal bilaterally.
The cervical internal carotid arteries up to the level of
the skull base are normal bilaterally without evidence of
dissection or occlusion.
IMPRESSION: 1. Unremarkable CT angiogram of the neck. No evidence of
carotid/vertebral occlusion or dissection.

## 2021-06-06 IMAGING — CT AGHEAD
4 of 7 series · 17 of 33 positions shown, 19 images · non-contrast
Comparison: none

Procedure(s): CT angio head

CT ANGIOGRAM HEAD WITH AND WITHOUT CONTRAST
CLINICAL HISTORY: TIA.
TECHNIQUE: Multiple contiguous axial CT angiogram images of the head
were obtained with and without 100 cc cc of Omni 350
contrast. Three-dimensional reconstructions were acquired on
a separate viewing station. Automated exposure control (AEC)
dose reduction technique was utilized.

[Series 4: aice 2.0 ce · axial · 0.57mm/px · z∈[-593,-483]mm · 6 of 77 slices shown, 8 images]
[im 11/77  soft-tissue]
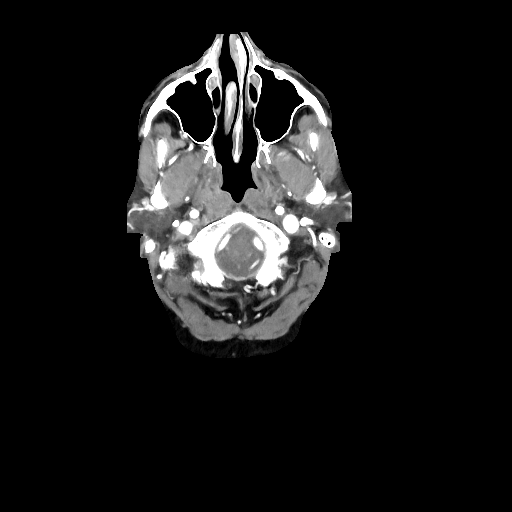
[im 11/77  bone]
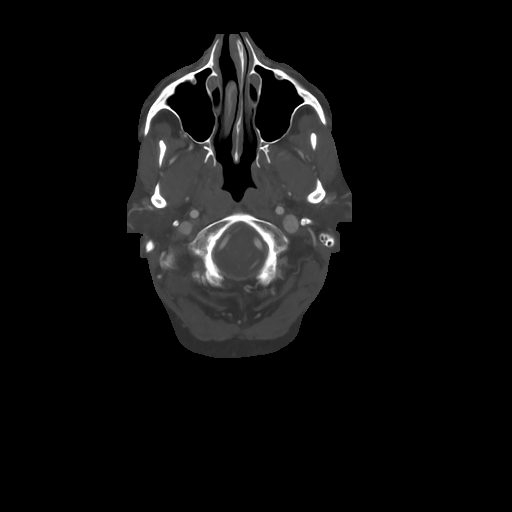
[im 22/77  bone]
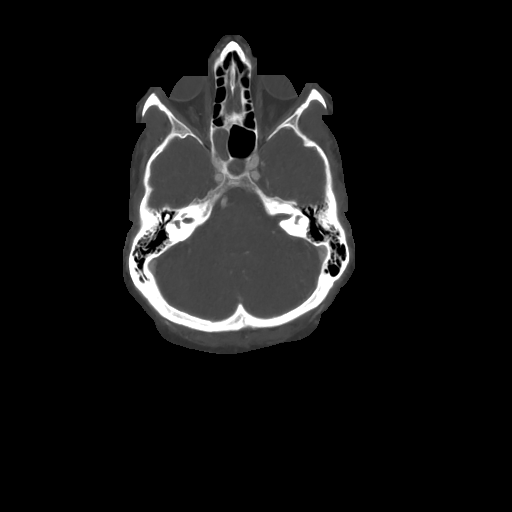
[im 33/77  bone]
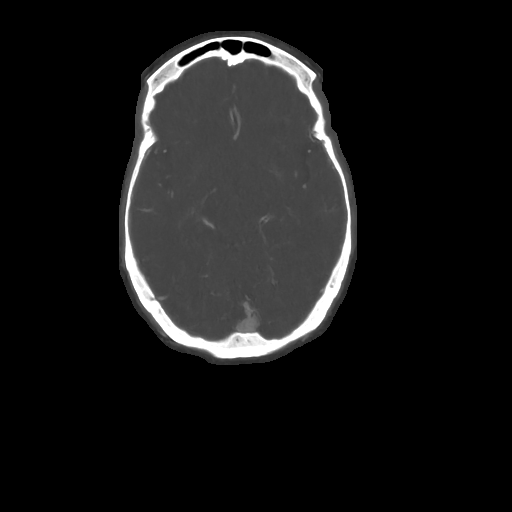
[im 44/77  bone]
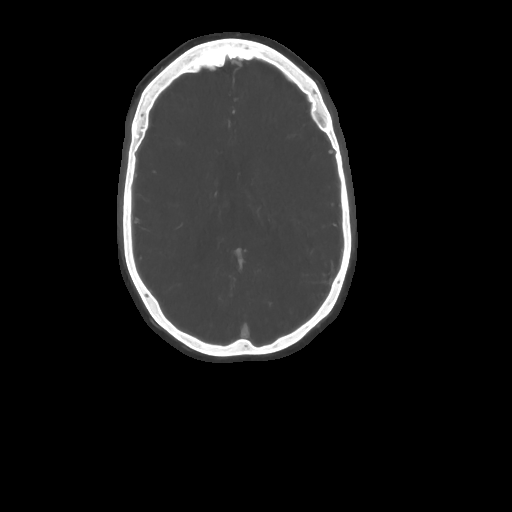
[im 55/77  soft-tissue]
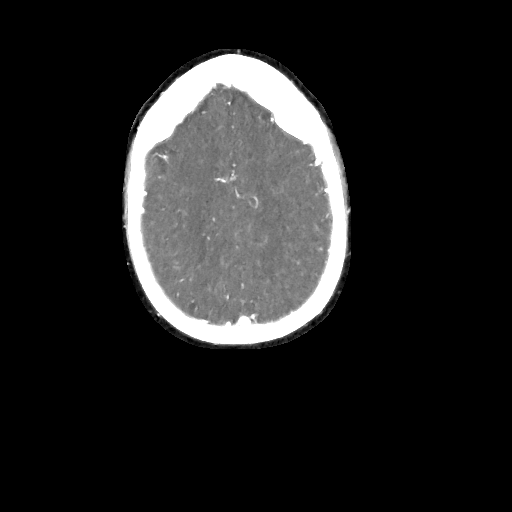
[im 55/77  bone]
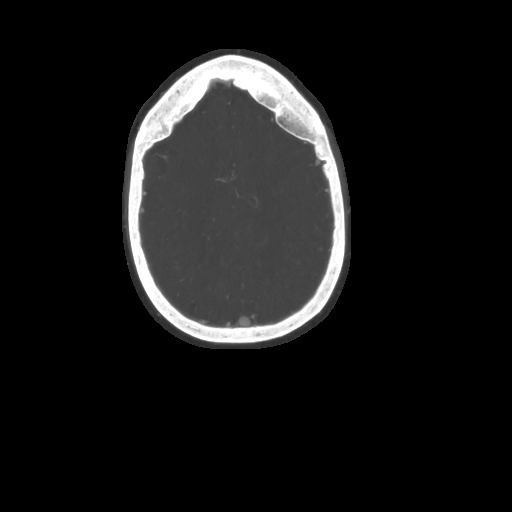
[im 66/77  bone]
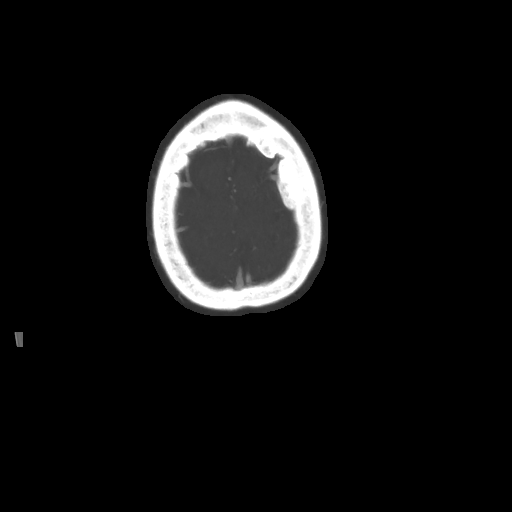

[Series 6: aice 10.000 ce · axial · 0.57mm/px · z∈[-583,-493]mm · 4 of 52 slices shown]
[im 11/52  bone]
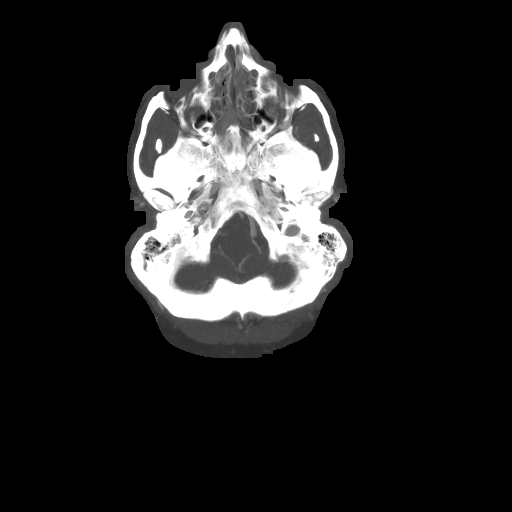
[im 21/52  bone]
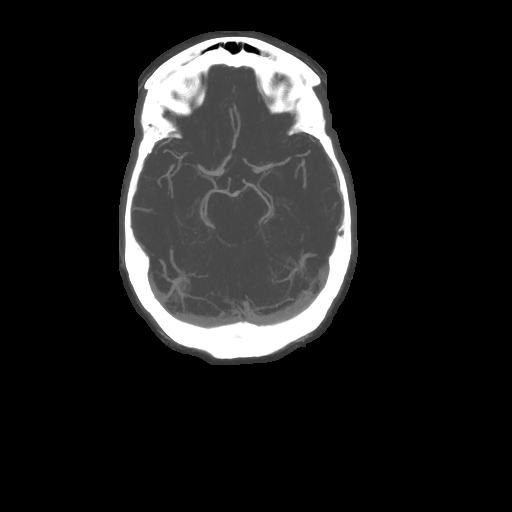
[im 31/52  bone]
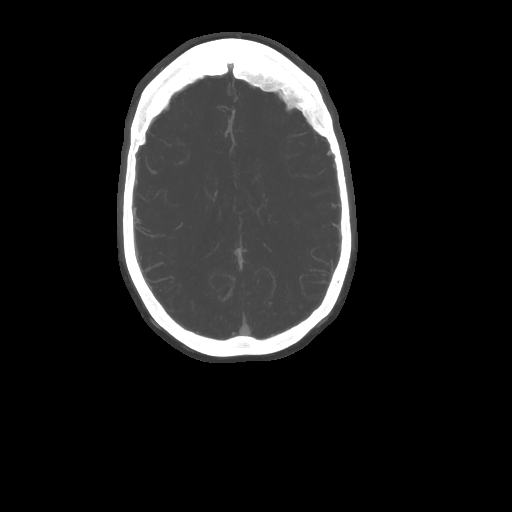
[im 41/52  bone]
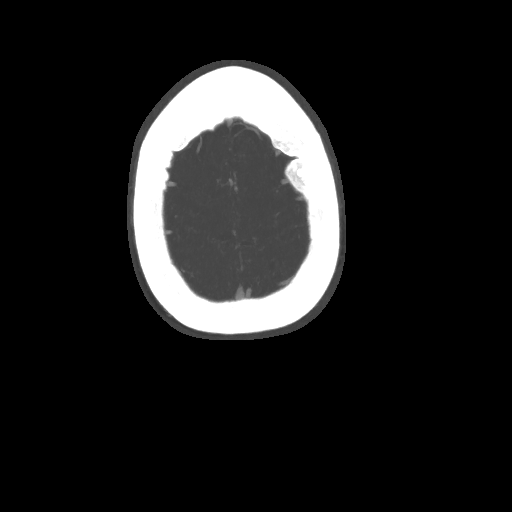

[Series 10: aice 2.000 ce · coronal · 0.57mm/px · 2 of 86 slices shown (1 of 2)]
[im 29/86  bone]
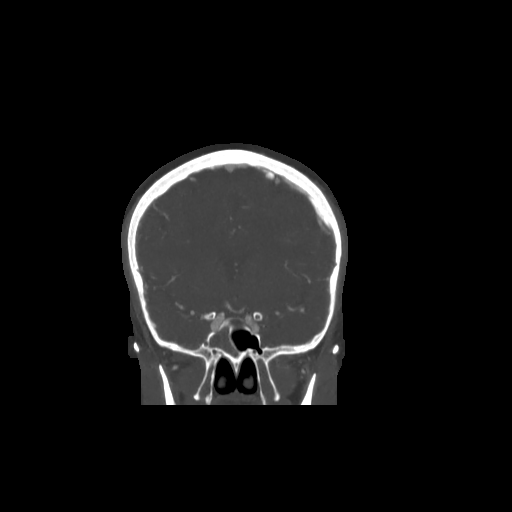
[im 57/86  bone]
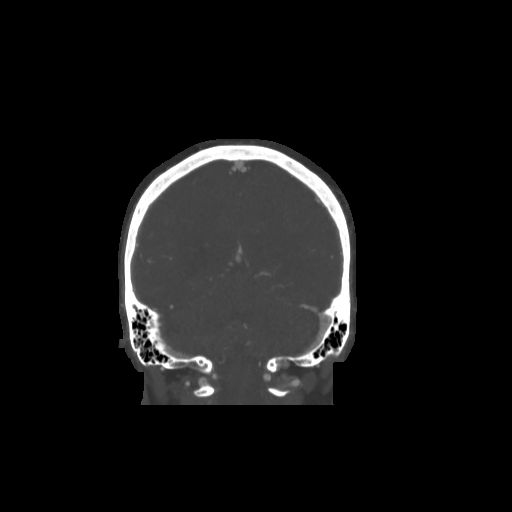

[Series 11: aice 2.000 ce · sagittal · 0.57mm/px · 5 of 70 slices shown (2 of 2)]
[im 12/70  bone]
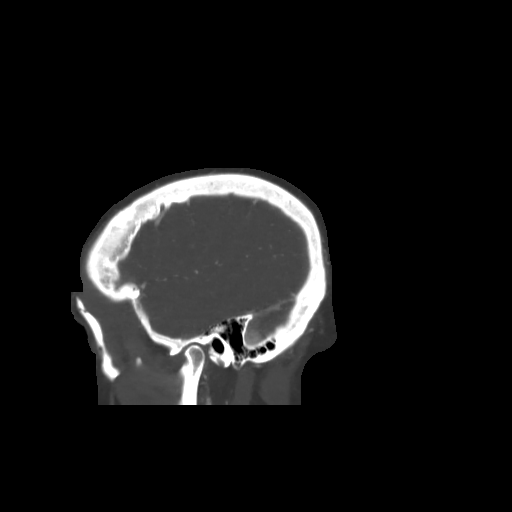
[im 24/70  bone]
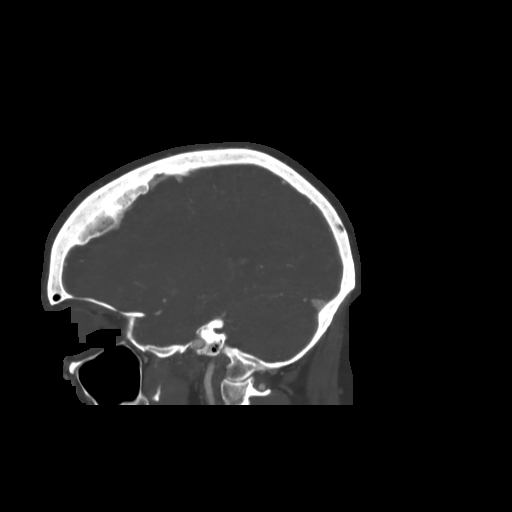
[im 35/70  bone]
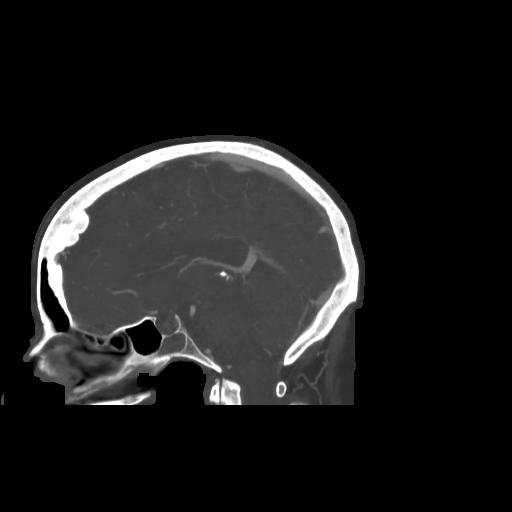
[im 47/70  bone]
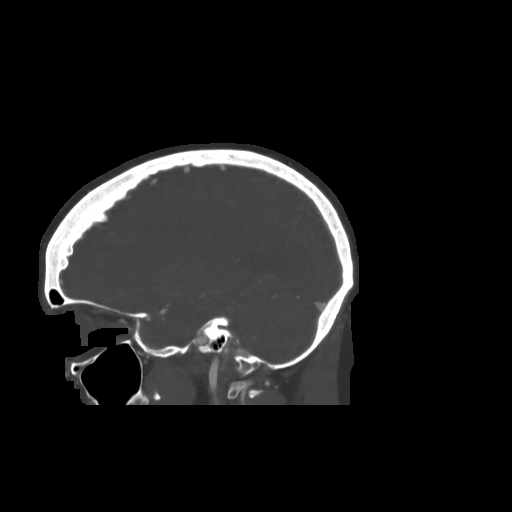
[im 58/70  bone]
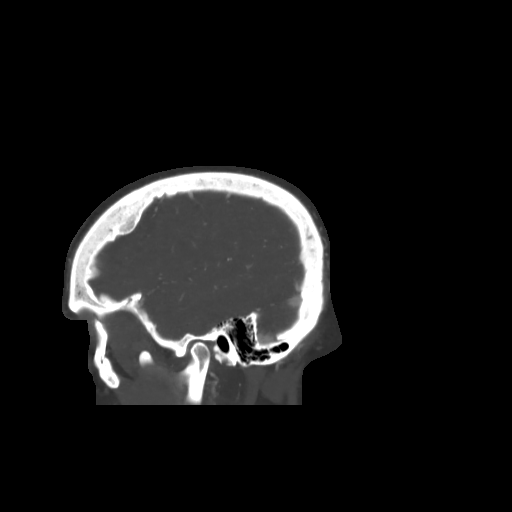

[17 of 33 positions shown; findings below may reference images not displayed]

FINDINGS: CT ANGIOGRAPHY OF THE HEAD:

The anterior circulation including the anterior
communicating artery, A1 and A2 segments bilaterally, M1 and
M2 segments bilaterally are normal. The distal internal
carotid arteries are normal bilaterally.

The posterior circulation including the distal vertebral
arteries bilaterally, the basilar artery, and the posterior
cerebral arteries are normal bilaterally. The visualized
small cerebellar branches and posterior communicating
arteries are normal.
IMPRESSION: 1. unremarkable CT angiogram head. No evidence of
intracranial aneurysm, large branch occlusion or vascular
malformation.

## 2021-07-02 ENCOUNTER — Encounter: Admit: 2021-07-02 | Discharge: 2021-07-02 | Payer: MEDICARE

## 2021-07-05 ENCOUNTER — Encounter: Admit: 2021-07-05 | Discharge: 2021-07-05 | Payer: MEDICARE

## 2021-08-03 IMAGING — MR BRAINWW
8 of 11 series · 32 of 48 positions shown · IV contrast (15ML  PROHANCE)
Comparison: none

Procedure(s): MR head/brain wo/w con

MRI EXAMINATION OF THE HEAD WITH AND WITHOUT CONTRAST:
HISTORY: Falling to the left, dizziness, difficulty with balance

[Series 3: T1 · sagittal · 4.0mm · 0.45mm/px · 2 of 25 slices shown]
[im 1/25]
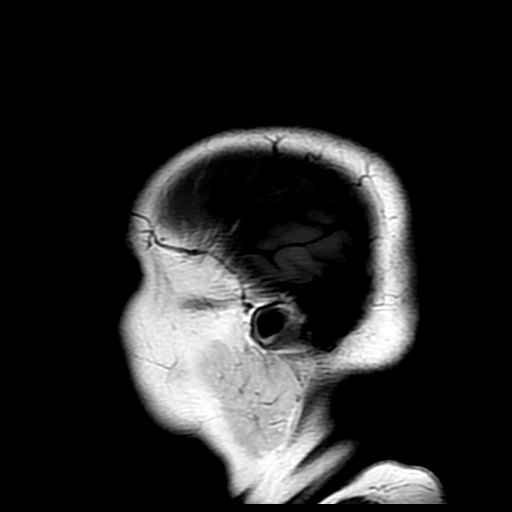
[im 7/25]
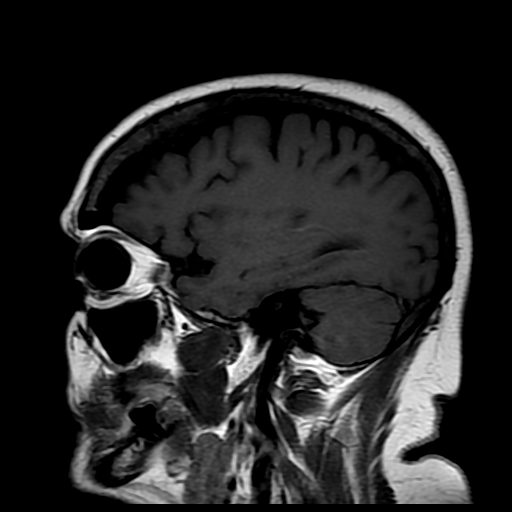

[Series 4: DWI · axial · 5.0mm · 0.94mm/px · z∈[-66,+74]mm · 8 of 58 slices shown (1 of 3)]
[im 1/58]
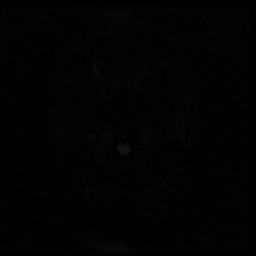
[im 7/58]
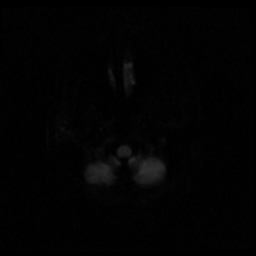
[im 20/58]
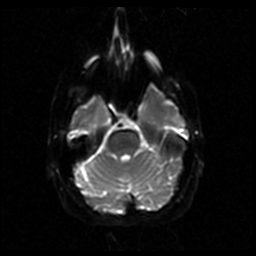
[im 26/58]
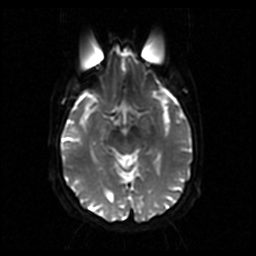
[im 32/58]
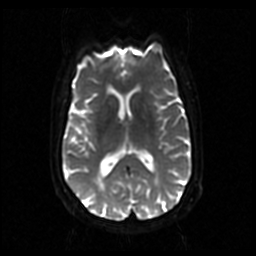
[im 39/58]
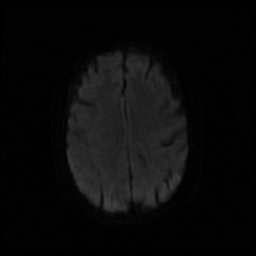
[im 51/58]
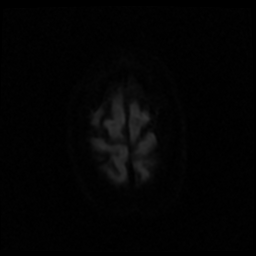
[im 58/58]
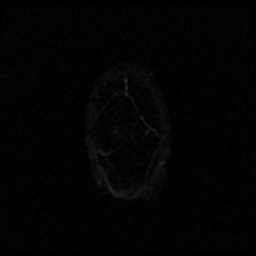

[Series 5: T2 · axial · 5.0mm · 0.45mm/px · z∈[-67,+69]mm · 3 of 22 slices shown]
[im 1/22]
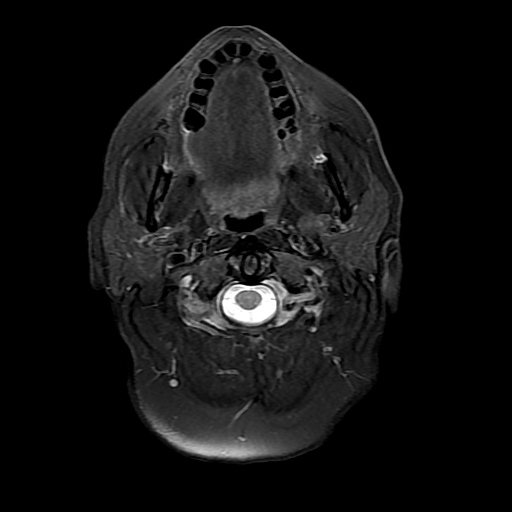
[im 11/22]
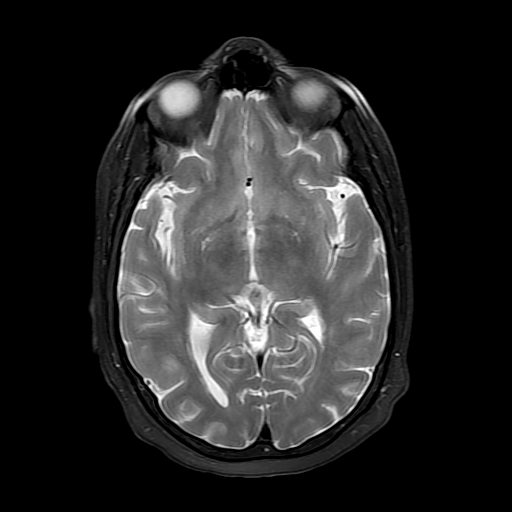
[im 22/22]
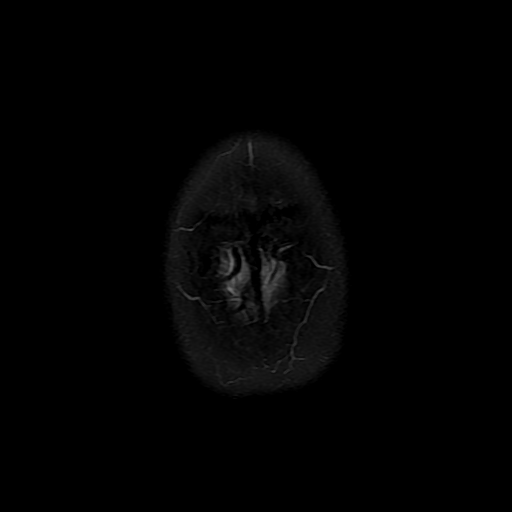

[Series 6: FLAIR · axial · 5.0mm · 0.45mm/px · z∈[-67,+69]mm · 3 of 22 slices shown (1 of 2)]
[im 1/22]
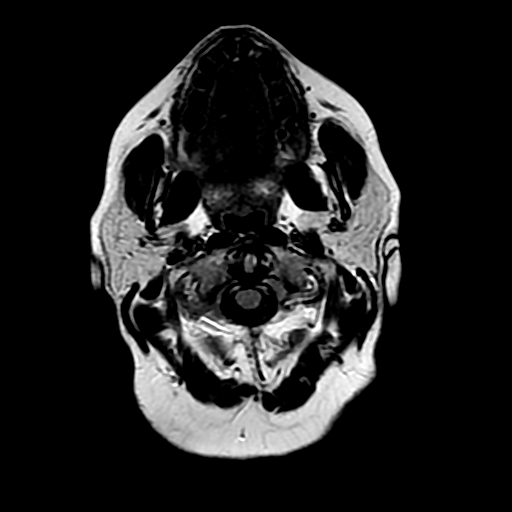
[im 11/22]
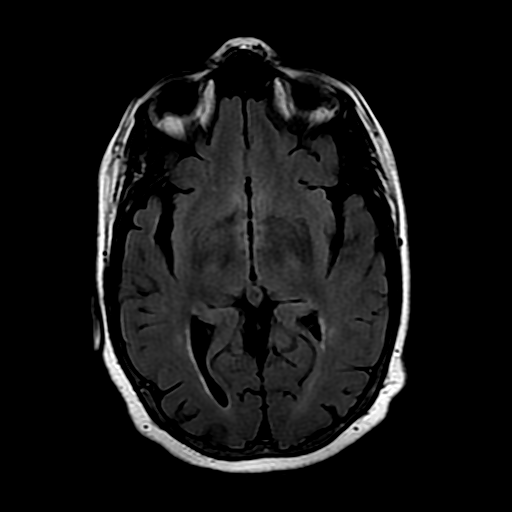
[im 22/22]
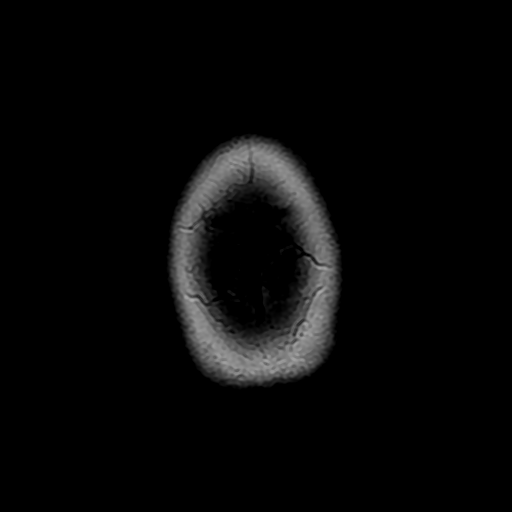

[Series 8: GRE · coronal · 5.0mm · 0.86mm/px · 4 of 28 slices shown]
[im 1/28]
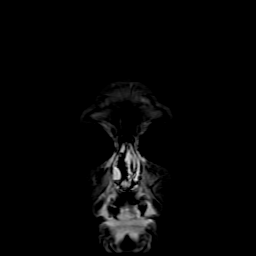
[im 10/28]
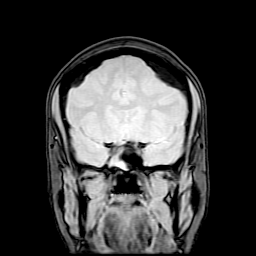
[im 19/28]
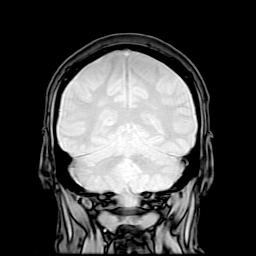
[im 28/28]
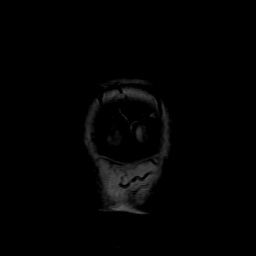

[Series 9: FLAIR · sagittal · 4.0mm · 0.45mm/px · 4 of 25 slices shown (2 of 2)]
[im 1/25]
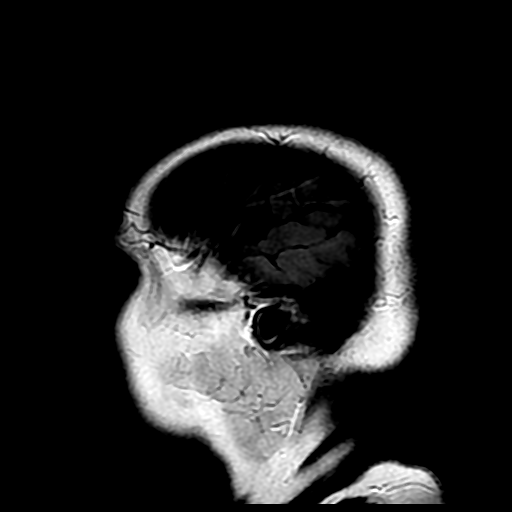
[im 9/25]
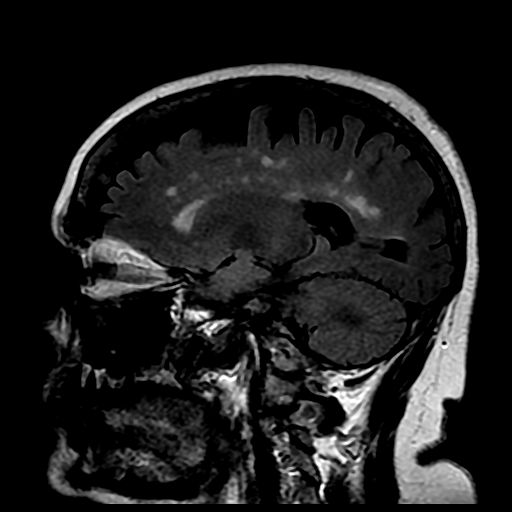
[im 17/25]
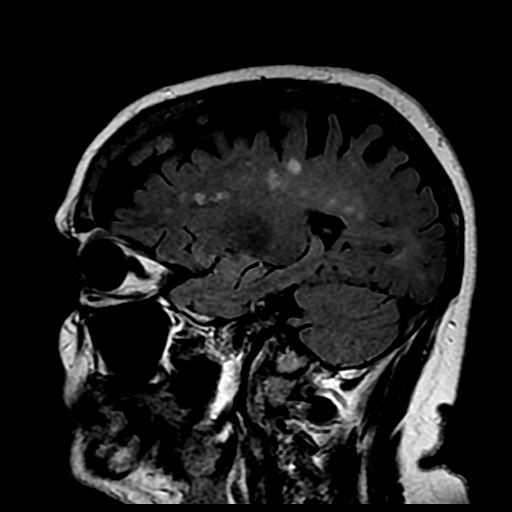
[im 25/25]
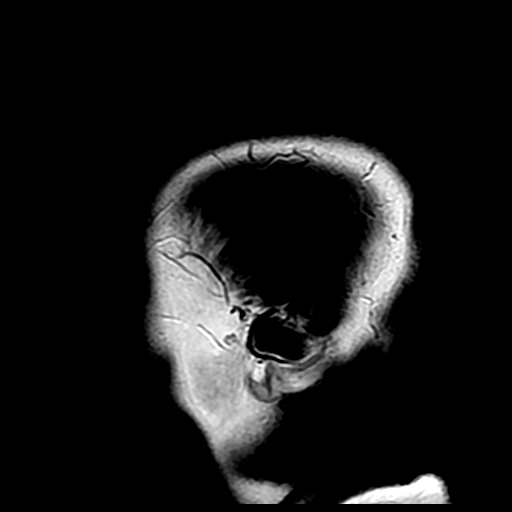

[Series 400: DWI · axial · 5.0mm · 0.94mm/px · z∈[-66,+74]mm · 4 of 29 slices shown (2 of 3)]
[im 1/29]
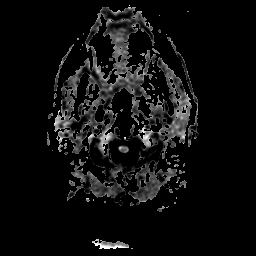
[im 10/29]
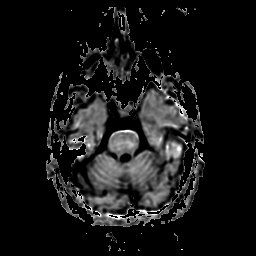
[im 19/29]
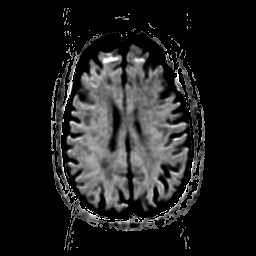
[im 29/29]
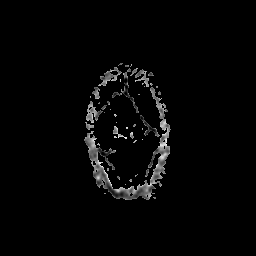

[Series 401: DWI · axial · 5.0mm · 0.94mm/px · z∈[-66,+74]mm · 4 of 29 slices shown (3 of 3)]
[im 1/29]
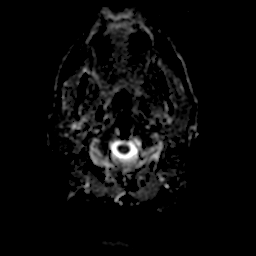
[im 10/29]
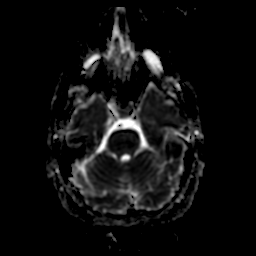
[im 19/29]
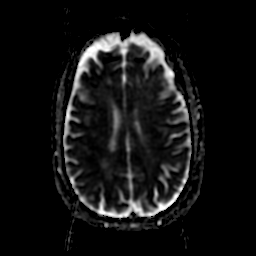
[im 29/29]
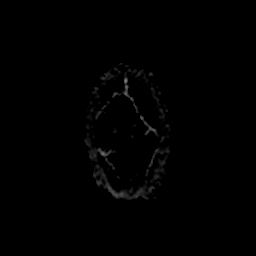

[32 of 48 positions shown; findings below may reference images not displayed]

FINDINGS: Routine MRI examination of the head was performed before and
after administration of 15 cc ProHance contrast material.
The brain stem and cerebellum are in normal alignment with
the skull base. The ventricles are symmetrical and normal in
size. No extra-axial fluid collections are identified. The
FLAIR sequence shows mildly prominent periventricular
microvascular ischemic changes. There are no findings of
acute infarct or acute abnormality on the diffusion weighted
sequence. No focal lesions are identified. The postcontrast
images demonstrate no evidence of abnormal contrast
enhancement. No abnormalities are seen in the pituitary
gland or suprasellar regions.. Prominent opacification of
the right side of the sphenoid sinus is demonstrated and
clinical correlation is recommended for whether this is
related to the patient's dizziness with the right-sided
sphenoid sinus disease. The rest of the sinuses are clear.
No abnormality is seen within the mastoids. Prominent
ossification extends along the midline parasagittal
calvarium felt to represent calcification. When compared to
a previous CT done 06/06/2021, prominent calcification extends
through the calvarium consistent with hyperostosis
frontalis..
IMPRESSION: 1. Mildly prominent periventricular microvascular ischemia.
There are no findings of abnormal enhancement after
contrast.

2. Prominent sinus disease involves the right sphenoid sinus
with there is opacification of the right sphenoid sinus.
Clinical correlation is recommended for whether this is
related to the patient's symptoms. The rest of the sinuses
and mastoids are clear.

3. Hyperostosis frontalis extends along the calvarium.

## 2021-09-13 ENCOUNTER — Encounter: Admit: 2021-09-13 | Discharge: 2021-09-13 | Payer: MEDICARE

## 2021-09-13 DIAGNOSIS — E039 Hypothyroidism, unspecified: Secondary | ICD-10-CM

## 2021-09-13 DIAGNOSIS — R079 Chest pain, unspecified: Secondary | ICD-10-CM

## 2021-09-13 DIAGNOSIS — K289 Gastrojejunal ulcer, unspecified as acute or chronic, without hemorrhage or perforation: Secondary | ICD-10-CM

## 2021-09-13 DIAGNOSIS — C7A8 Other malignant neuroendocrine tumors: Secondary | ICD-10-CM

## 2021-09-13 DIAGNOSIS — I10 Essential (primary) hypertension: Secondary | ICD-10-CM

## 2021-09-13 DIAGNOSIS — I4891 Unspecified atrial fibrillation: Secondary | ICD-10-CM

## 2021-09-13 DIAGNOSIS — E785 Hyperlipidemia, unspecified: Secondary | ICD-10-CM

## 2021-09-13 DIAGNOSIS — J449 Chronic obstructive pulmonary disease, unspecified: Secondary | ICD-10-CM

## 2021-09-13 DIAGNOSIS — G4733 Obstructive sleep apnea (adult) (pediatric): Secondary | ICD-10-CM

## 2021-09-13 DIAGNOSIS — M26609 Unspecified temporomandibular joint disorder, unspecified side: Secondary | ICD-10-CM

## 2021-09-13 DIAGNOSIS — Z8719 Personal history of other diseases of the digestive system: Secondary | ICD-10-CM

## 2021-09-13 DIAGNOSIS — N186 End stage renal disease: Secondary | ICD-10-CM

## 2021-09-13 DIAGNOSIS — K8689 Other specified diseases of pancreas: Secondary | ICD-10-CM

## 2021-09-13 DIAGNOSIS — J45909 Unspecified asthma, uncomplicated: Secondary | ICD-10-CM

## 2021-09-13 DIAGNOSIS — H269 Unspecified cataract: Secondary | ICD-10-CM

## 2021-09-13 DIAGNOSIS — J309 Allergic rhinitis, unspecified: Secondary | ICD-10-CM

## 2021-09-13 DIAGNOSIS — R7303 Prediabetes: Secondary | ICD-10-CM

## 2021-09-13 DIAGNOSIS — K219 Gastro-esophageal reflux disease without esophagitis: Secondary | ICD-10-CM

## 2021-09-13 DIAGNOSIS — M797 Fibromyalgia: Secondary | ICD-10-CM

## 2021-09-13 DIAGNOSIS — M1990 Unspecified osteoarthritis, unspecified site: Secondary | ICD-10-CM

## 2021-09-13 DIAGNOSIS — I513 Intracardiac thrombosis, not elsewhere classified: Secondary | ICD-10-CM

## 2021-09-13 LAB — COMPREHENSIVE METABOLIC PANEL
ALBUMIN: 4 g/dL (ref 3.5–5.0)
CHLORIDE: 107 MMOL/L (ref 98–110)
EGFR: >60 mL/min — ABNORMAL HIGH (ref >60)
POTASSIUM: 3.7 MMOL/L (ref 3.5–5.1)
SODIUM: 141 MMOL/L (ref 137–147)

## 2021-09-13 LAB — CBC AND DIFF
BASOPHILS %: 1 % (ref 0–2)
EOSINOPHILS %: 5 % (ref 0–5)
HEMATOCRIT: 36 % (ref 36–45)
HEMOGLOBIN: 12 g/dL (ref 12.0–15.0)
LYMPHOCYTES %: 22 % — ABNORMAL LOW (ref 24–44)
MCH: 28 pg (ref 26–34)
MCV: 87 FL (ref 80–100)
MPV: 8.7 FL — AB (ref 7–11)
RBC COUNT: 4.1 M/UL (ref 4.0–5.0)
WBC COUNT: 9 K/UL — ABNORMAL HIGH (ref 4.5–11.0)

## 2021-09-13 MED ORDER — RP DX COPPER CU-64 DOTATATE MCI
4 | Freq: Once | INTRAVENOUS | 0 refills | Status: CP
Start: 2021-09-13 — End: ?
  Administered 2021-09-13: 15:00:00 4.5 via INTRAVENOUS

## 2021-09-13 NOTE — Progress Notes
Name: Shelby Rogers          MRN: [id number]      DOB: [birth year]      AGE: 70 y.o.   DATE OF SERVICE: 09/13/2021    Subjective:             Reason for Visit:  Follow Up    Shelby Rogers is a 70 y.o. female newly diagnosed with neuroendocrine cancer.    Cancer Staging  No matching staging information was found for the patient.    History of Present Illness  Shelby Rogers is a 70 y.o. 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 She is status post right robotic partial nephrectomy with negative margins 04/25/15. Surveillance CT scans were completed in 2017 which incidentally found a subcentimeter nodule on the head of the pancreas. She had an EUS 09/25/18 revealed a 7 mm round hypoechoic lesion. This was surrounded by blood vessels. FNA was completed which revealed well-differentiated neuroendocrine tumor. MRI 10/15/18 did not reveal a pancreatic lesion. She had a CT 08/23/19 that revealed a stable, small, arterial phase hyperenhancing nodule within the pancreatic head. The size was stable compared to previous imaging. An EUS was repeated 12/07/19 which revealed an 8 mm hypoechoic mass on the head of the pancreas. Due to the blood vessels around the lesion it was difficult to perform FNA. CT scan 01/25/20 did not identify a pancreatic lesion. She met with Dr.Kumer 02/03/20, whipple surgery was offered but she declined at this time. She is here today to establish care.    Interval changes 09/13/21    Shelby Rogers presents today with her daughter for follow-up of neuroendocrine tumor.  She denies any particular changes since her last visit.  She has baseline fatigue which fluctuates from day-to-day, but is persistent.  She denies any major issues with diarrhea, infectious issues with constipation from time to time.  She denies any episodes of flushing or palpitations.  She denies any other concerns today.       Review of Systems Constitutional: Negative for activity change, appetite change, chills, fatigue, fever and unexpected weight change.   HENT: Negative for congestion, rhinorrhea and sinus pressure.    Eyes: Negative for visual disturbance.   Respiratory: Negative for cough, shortness of breath and wheezing.    Cardiovascular: Negative for chest pain and palpitations.   Gastrointestinal: Negative for abdominal distention, abdominal pain, blood in stool, constipation, diarrhea and nausea.   Genitourinary: Negative for dysuria, frequency and urgency.   Musculoskeletal: Negative for arthralgias and myalgias.   Skin: Negative for color change and rash.   Neurological: Negative for weakness and headaches.   Psychiatric/Behavioral: The patient is not nervous/anxious.          Objective:         ? albuterol sulfate (PROAIR HFA) 90 mcg/actuation HFA aerosol inhaler Inhale 2 puffs by mouth into the lungs every 6 hours as needed for Wheezing or Shortness of Breath. Shake well before use.   ? albuterol-ipratropium (DUONEB) 0.5 mg-3 mg(2.5 mg base)/3 mL nebulizer solution Inhale 3 mL solution by nebulizer as directed four times daily as needed for Wheezing.   ? dilTIAZem CD (CARDIZEM CD) 120 mg capsule Take 120 mg by mouth at bedtime daily.   ? dilTIAZem LA (CARDIZEM LA) 180 mg tablet 180 mg daily. Indications: takes 180mg  in AM   ? doxycycline hyclate (VIBRACIN) 100 mg tablet    ? famotidine (  PEPCID) 20 mg tablet Take 20 mg by mouth twice daily.   ? fexofenadine (ALLEGRA) 180 mg tablet Take 180 mg by mouth daily.   ? fish oil /omega-3 fatty acids (SEA-OMEGA) 340/1000 mg capsule Take 1 capsule by mouth daily.   ? flecainide (TAMBOCOR) 150 mg tablet Take one-half tablet by mouth twice daily.   ? fluticasone (FLONASE) 50 mcg/actuation nasal spray Apply 2 Sprays to each nostril as directed daily.   ? levothyroxine (SYNTHROID) 25 mcg tablet Take 25 mcg by mouth every morning.   ? montelukast (SINGULAIR) 10 mg tablet Take 10 mg by mouth every morning.   ? ondansetron HCL (ZOFRAN) 4 mg tablet    ? ondansetron HCL (ZOFRAN) 8 mg tablet Take 8 mg by mouth every 8 hours as needed.   ? other medication 1 Dose. Multivitamin with 2000 units Vitamin D and 100mg  CoQ 10   ? pantoprazole DR (PROTONIX) 40 mg tablet Take one tablet by mouth twice daily. Starting 1 week prior to procedure and for 1 month post procedure.   ? potassium chloride SR (K-DUR) 20 mEq tablet Take one tablet by mouth daily. Take with a meal and a full glass of water.   ? rivaroxaban (XARELTO) 20 mg tablet Take one tablet by mouth daily with dinner. Take with food.   ? senna (SENOKOT) 8.6 mg tablet Take one tablet by mouth daily.   ? tiotropium bromide (SPIRIVA RESPIMAT) 2.5 mcg/actuation inhaler Inhale 2 puffs by mouth into the lungs daily.   ? valsartan (DIOVAN) 80 mg tablet Take 80 mg by mouth twice daily.   ? zolpidem (AMBIEN) 5 mg tablet Take 2.5 mg by mouth nightly as needed.     Vitals:    09/13/21 1520   BP: 131/78   BP Source: Arm, Left Lower   Pulse: 65   Temp: 36 ?C (96.8 ?F)   Resp: 16   SpO2: 100%   TempSrc: Temporal   PainSc: Zero   Weight: 102.2 kg (225 lb 3.2 oz)     Body mass index is 37.48 kg/m?Marland Kitchen     Pain Score: Zero       Fatigue Scale: 8    Pain Addressed:  N/A    Patient Evaluated for a Clinical Trial: No treatment clinical trial available for this patient.     [place] Cooperative Oncology Group performance status is 0, Fully active, able to carry on all pre-disease performance without restriction.Marland Kitchen     Physical Exam  Constitutional:       General: She is not in acute distress.     Appearance: She is well-developed.   HENT:      Head: Normocephalic and atraumatic.      Mouth/Throat:      Pharynx: No oropharyngeal exudate.   Eyes:      Pupils: Pupils are equal, round, and reactive to light.   Cardiovascular:      Rate and Rhythm: Normal rate and regular rhythm.      Heart sounds: No murmur heard.    No friction rub. No gallop.   Pulmonary:      Effort: Pulmonary effort is normal.      Breath sounds: Normal breath sounds. No wheezing or rales.   Abdominal:      General: Bowel sounds are normal. There is no distension.      Palpations: Abdomen is soft.      Tenderness: There is no abdominal tenderness.   Musculoskeletal:  General: No tenderness.   Lymphadenopathy:      Cervical: No cervical adenopathy.   Skin:     General: Skin is warm and dry.      Findings: No rash.   Neurological:      Mental Status: She is alert and oriented to person, place, and time.      Cranial Nerves: No cranial nerve deficit.          CBC w diff    Lab Results   Component Value Date/Time    WBC 9.0 09/13/2021 09:54 AM    RBC 4.17 09/13/2021 09:54 AM    HGB 12.0 09/13/2021 09:54 AM    HCT 36.4 09/13/2021 09:54 AM    MCV 87.2 09/13/2021 09:54 AM    MCH 28.8 09/13/2021 09:54 AM    MCHC 33.1 09/13/2021 09:54 AM    RDW 15.4 (H) 09/13/2021 09:54 AM    PLTCT 243 09/13/2021 09:54 AM    MPV 8.7 09/13/2021 09:54 AM    Lab Results   Component Value Date/Time    NEUT 59 09/13/2021 09:54 AM    ANC 5.40 09/13/2021 09:54 AM    LYMA 22 (L) 09/13/2021 09:54 AM    ALC 2.00 09/13/2021 09:54 AM    MONA 13 (H) 09/13/2021 09:54 AM    AMC 1.20 (H) 09/13/2021 09:54 AM    EOSA 5 09/13/2021 09:54 AM    AEC 0.40 09/13/2021 09:54 AM    BASA 1 09/13/2021 09:54 AM    ABC 0.10 09/13/2021 09:54 AM        Comprehensive Metabolic Profile    Lab Results   Component Value Date/Time    NA 141 09/13/2021 09:54 AM    K 3.7 09/13/2021 09:54 AM    CL 107 09/13/2021 09:54 AM    CO2 26 09/13/2021 09:54 AM    GAP 8 09/13/2021 09:54 AM    BUN 15 09/13/2021 09:54 AM    CR 0.86 09/13/2021 09:54 AM    GLU 101 (H) 09/13/2021 09:54 AM    Lab Results   Component Value Date/Time    CA 9.0 09/13/2021 09:54 AM    ALBUMIN 4.0 09/13/2021 09:54 AM    TOTPROT 6.6 09/13/2021 09:54 AM    ALKPHOS 57 09/13/2021 09:54 AM    AST 16 09/13/2021 09:54 AM    ALT 16 09/13/2021 09:54 AM    TOTBILI 0.5 09/13/2021 09:54 AM    GFR >60 11/09/2020 02:21 PM    GFRAA >60 11/09/2020 02:21 PM             Assessment and Plan:  1- Well-differentiated neuroendocrine cancer  Surveillance CT scans were completed in 2017 which incidentally found a subcentimeter nodule on the head of the pancreas. She had an EUS 09/25/18 revealed a 7 mm round hypoechoic lesion. This was surrounded by blood vessels. FNA was completed which revealed well-differentiated neuroendocrine tumor. MRI 10/15/18 did not reveal a pancreatic lesion. She had a CT 08/23/19 that revealed a stable, small, arterial phase hyperenhancing nodule within the pancreatic head. The size was stable compared to previous imaging. An EUS was repeated 12/07/19 which revealed an 8 mm hypoechoic mass on the head of the pancreas. Due to the blood vessels around the lesion it was difficult to perform FNA. CT scan 01/25/20 did not identify a pancreatic lesion. She met with Dr.Kumer 02/03/20, whipple surgery was offered but she declined at this time.     Plan:  -Discussed her neuroendocrine cancer appears resectable based on  her EUS findings and no metastatic disease present on imaging. She does not want to proceed with surgery at this time.  -Will continue to monitor the size of her pancreatic lesion. Return in 3 months with lab, office visit, and CT scans    06/22/20:   Labs reviewed   Scans (dotatate) reviewed - no evidence of progression or metastatic disease   She is scheduled for cardiac ablation procedure on 07/19/20. Per her cardiologist, she will need to be on anticoagulation for at least 3 months.   Discussed delaying her pancreatic surgery 3 months. RTC in 3 months with labs and follow up scans (dotatate scan)     11/09/20:   Reviewed current dotatate scan showing activity over the primary tumor in the pancreas with no evidence of metastatic disease.   She is s/p cardiac ablation procedure and is ready now to see Dr Lucianne Muss to discuss surgery    Referral placed to see Dr Cathie Beams within the next 2-3 weeks to discuss surgery     02/01/21:   Labs reviewed   Dotatate scan completed earlier today - awaiting radiology read   She is scheduled to meet with Dr Cathie Beams today to discuss surgery   -Discussed that based on surgical outcome, she may not need sandostatin     03/15/21:   Surgery was not recommended after discussion with Dr Cathie Beams   Discussed monthly sandostatin and she opted to just do watchful waiting with dotatate scans and labs every 3-4 months     09/13/21  -Patient presents today for follow-up of neuroendocrine tumor.  Labs reviewed, stable, discussed with patient today  - Neuroendocrine PET scan 09/13/2021 showed similar-appearing pancreatic neuroendocrine tumor, intensity was mildly increased to 40.8 (previous 32.3).  No other signs of metastatic disease  - Discussed again possibility of going on octreotide.  Patient is worried that she will react to octreotide given she has reactions to many other medications.  -We will continued observation at this time, octreotide can be considered in the future  - Return to clinic in 6 months with repeat imaging/labs      2- Palpitations  -Follows with cardiology at [facility] in [place]. She has history of atrial fibrillation.   -s/p ablation for atrial fibrillation     Patient was seen and discussed with Dr. Reynolds Bowl, MD  PGY-5, Hematology/Oncology Fellow  Pager [phone] or available on Voalte    ATTESTATION    I personally performed the key portions of the E/M visit, discussed case with resident and concur with resident documentation of history, physical exam, assessment, and treatment plan unless otherwise noted.    Staff name:  Modena Slater, MD Date:  09/13/2021

## 2021-09-13 NOTE — Patient Instructions
Dr.Anwaar Saeed,  Medical Oncologist specializing in Gastrointestinal malignancies    [place], Nurse Practitioner    , Clinical Nurse Coordinator    Phone: [phone]   Monday-Friday 8:00am- 4:00pm   Fax: [phone]    After Hours Phone: [phone]- On call number for urgent needs outside of business hours ask for the On call Oncology fellow to be paged and they will call you back    Medication refills: please contact your pharmacy for medication refills, if they do not have any refills on file they will contact the office, make sure they have our correct contact information on file   P: [phone], F: [phone]    MyChart Messages: All mychart messages are answered by the nurses, even if you select to send the message to Dr.Saeed or . We often communicate with Dr.Saeed or  regarding how to answer these messages. Messages are answered 8:00am-4:00pm Monday-Friday.    Phone Calls: We are typically not at our desks where our phones are located as we are in clinic. Please leave us a message as we check our messages several times per day. It is our goal to answer these messages as soon as possible. Messages are answered from 8:00am-4:00 pm Monday-Friday. Please make sure you leave your full name with the spelling, date of birth, and reason for your call when leaving a message.

## 2021-10-03 IMAGING — CT SINUSWO
3 of 5 series · 17 of 47 positions shown, 20 images · non-contrast
Comparison: none

Procedure(s): CT sinus wo con

CT SINUS without CONTRAST
CLINICAL DATA: Chronic sinusitis.
TECHNIQUE: 5 mm transverse images.  Coronal reformations. Automated
exposure control (AEC)
dose reduction technique was utilized.

[Series 3: soft tissue aice 2.0 · axial · 0.35mm/px · z∈[-599,-489]mm · 12 of 65 slices shown, 15 images]
[im 5/65  brain]
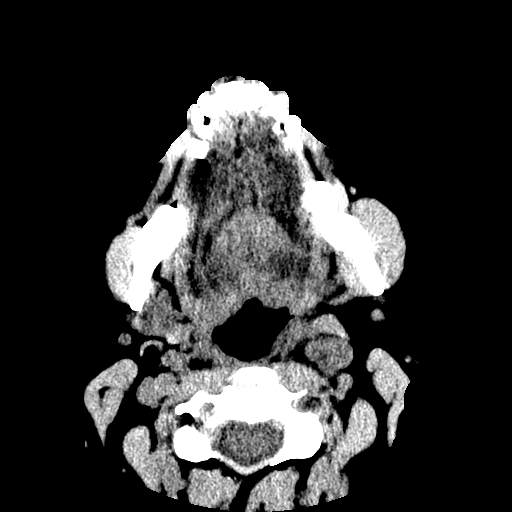
[im 5/65  bone]
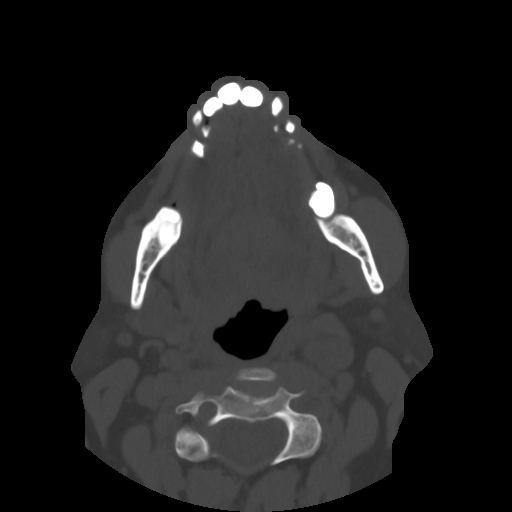
[im 9/65  bone]
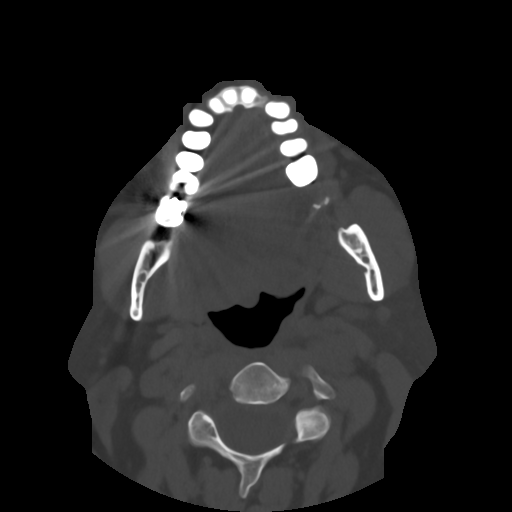
[im 14/65  bone]
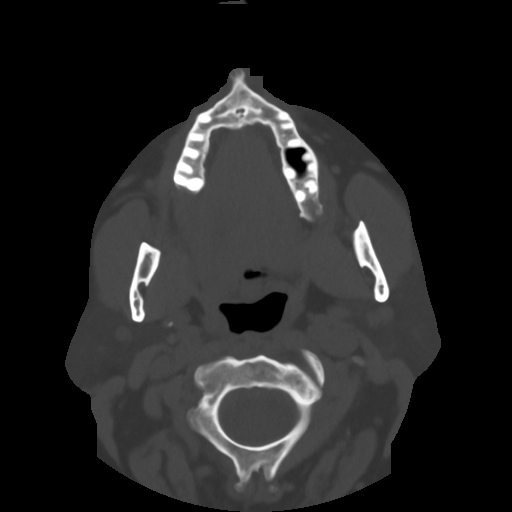
[im 20/65  bone]
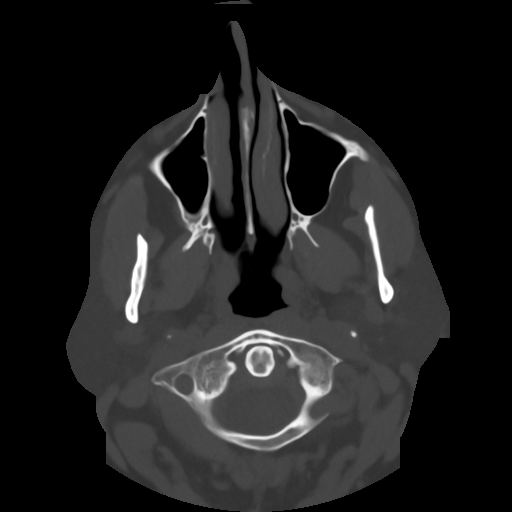
[im 25/65  brain]
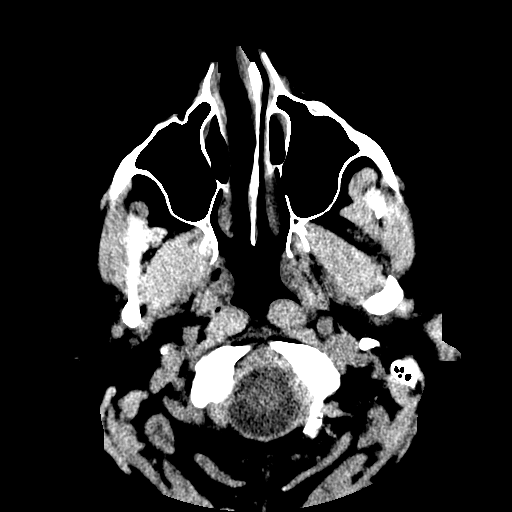
[im 25/65  bone]
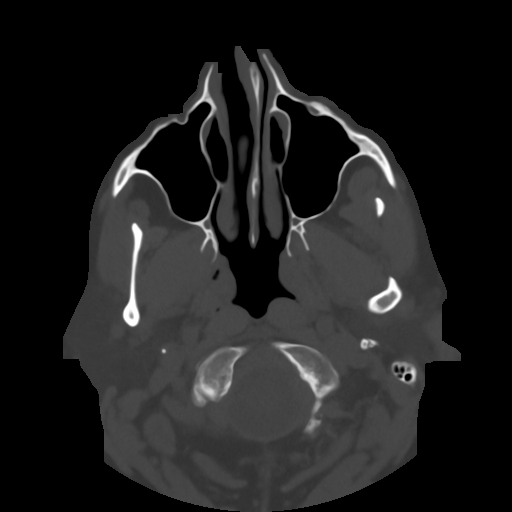
[im 29/65  bone]
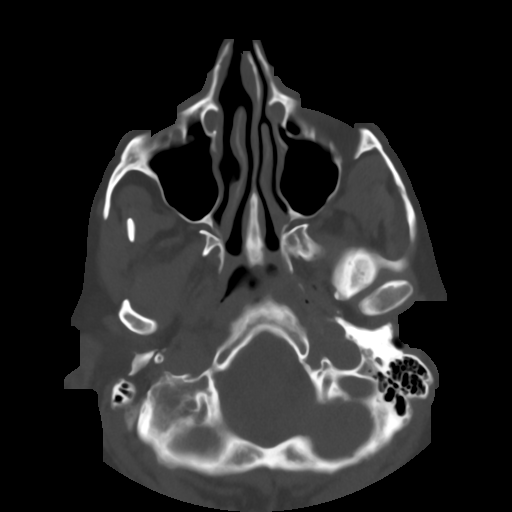
[im 36/65  bone]
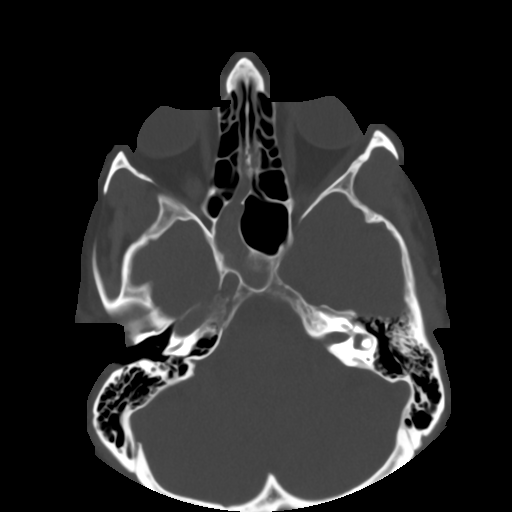
[im 40/65  bone]
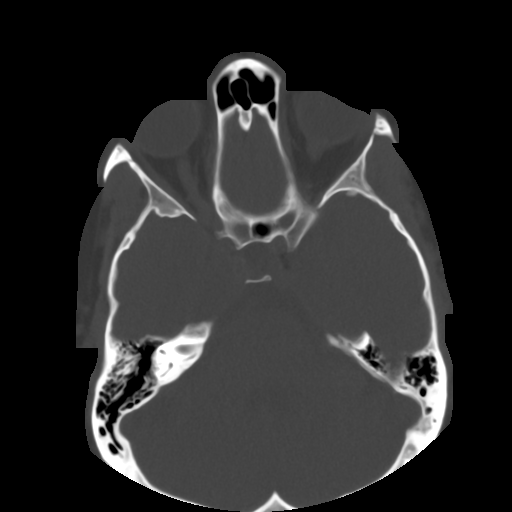
[im 45/65  brain]
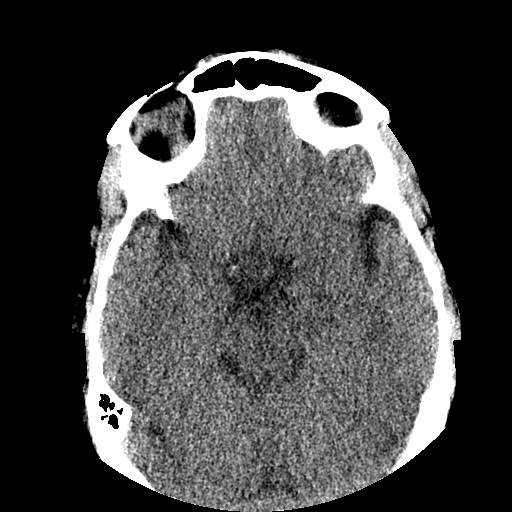
[im 45/65  bone]
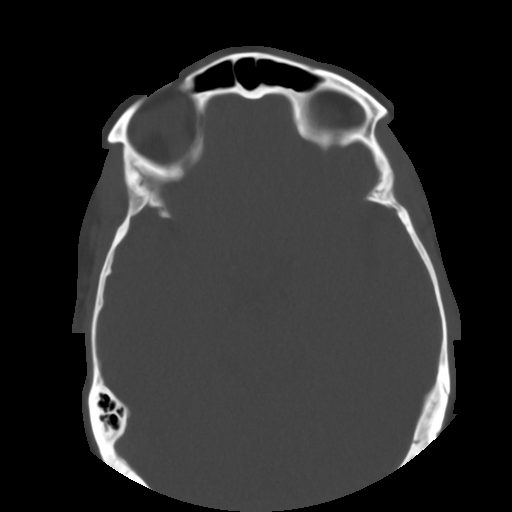
[im 51/65  bone]
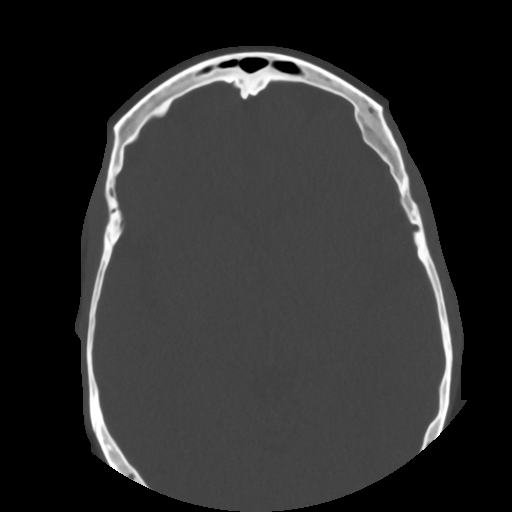
[im 56/65  bone]
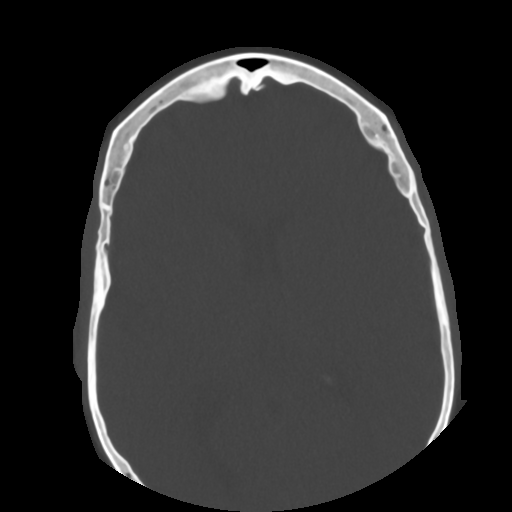
[im 60/65  bone]
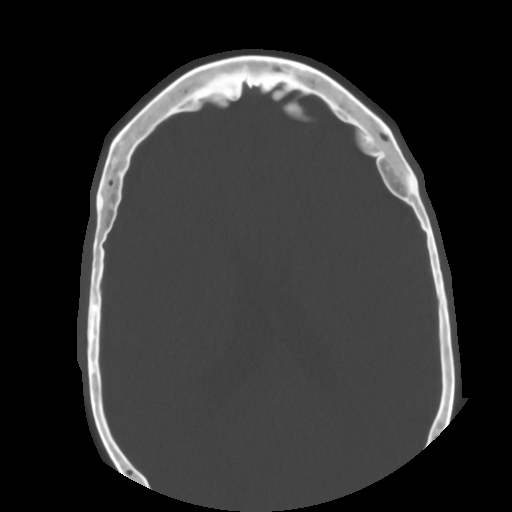

[Series 5: aice 2.000 · coronal · 0.35mm/px · 3 of 80 slices shown]
[im 20/80  bone]
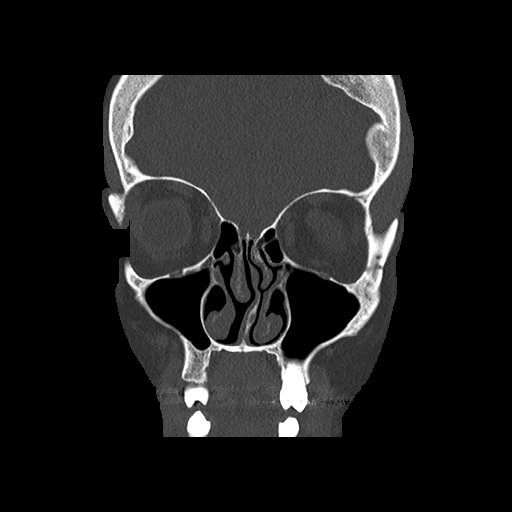
[im 40/80  bone]
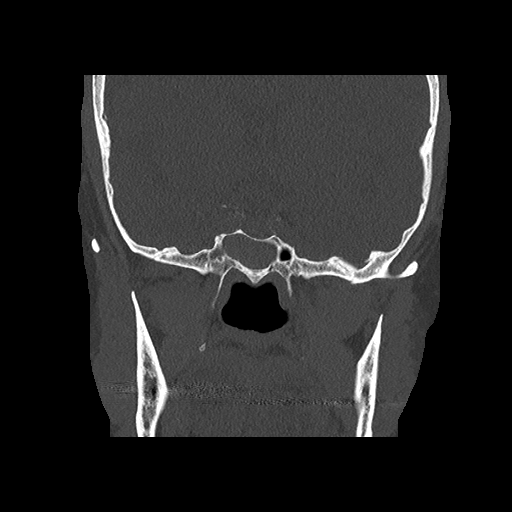
[im 60/80  bone]
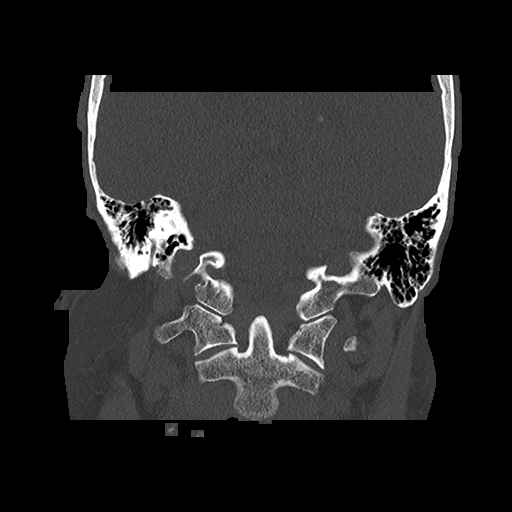

[Series 9: bone aice 2.000 · sagittal · 0.35mm/px · 2 of 70 slices shown]
[im 24/70  bone]
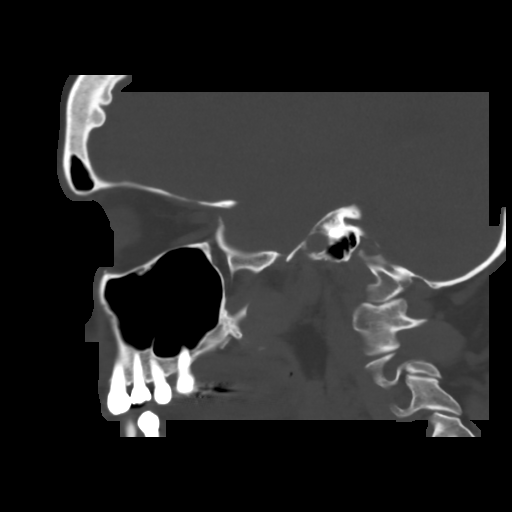
[im 47/70  bone]
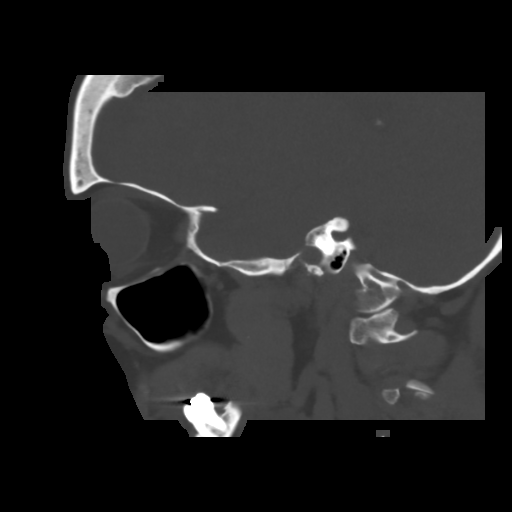

[17 of 47 positions shown; findings below may reference images not displayed]

FINDINGS: The paranasal sinuses are well aerated. Partial
opacification of the left
anterior ethmoid air cells and complete opacification of the
right sphenoid
sinus.  No fluid levels are present.  The ostiomeatal
complexes are patent.  The
nasal septum deviated to the left.
IMPRESSION: Opacification of the left anterior ethmoid air cells and
right sphenoid sinus.
Nasal septum deviated to the left.

## 2021-10-29 ENCOUNTER — Encounter: Admit: 2021-10-29 | Discharge: 2021-10-29 | Payer: MEDICARE

## 2021-10-29 NOTE — Progress Notes
Medical records request for continuity of care.    Please fax to the attention of Day    STAT      Requested Records:      Please send a copy of the patients latest Holter monitor results for 2022.     Thank You,     Day Kathreen Devoid   [facility]   [place] [address]  Phone: [phone]  Fax: [phone]

## 2021-11-06 ENCOUNTER — Encounter: Admit: 2021-11-06 | Discharge: 2021-11-06 | Payer: MEDICARE

## 2021-11-07 NOTE — Progress Notes
Request for the following medical records for purpose of continuity of care:   Has an appointment with [facility] on 11/13/2021    Please send:  Holter monitor results and associated ECG tracings.    Please Fax to: [phone]  Cardiology Services at [facility]   Dr. Karle Starch  Attention:  Malachy Mood, RN    Thank you

## 2021-11-09 ENCOUNTER — Encounter: Admit: 2021-11-09 | Discharge: 2021-11-09 | Payer: MEDICARE

## 2021-11-09 MED ORDER — FLECAINIDE 150 MG PO TAB
75 mg | ORAL_TABLET | Freq: Two times a day (BID) | ORAL | 1 refills | 30.00000 days | Status: AC
Start: 2021-11-09 — End: ?

## 2021-11-13 ENCOUNTER — Encounter: Admit: 2021-11-13 | Discharge: 2021-11-13 | Payer: MEDICARE

## 2021-11-13 ENCOUNTER — Ambulatory Visit: Admit: 2021-11-13 | Discharge: 2021-11-14 | Payer: MEDICARE

## 2021-11-13 DIAGNOSIS — J309 Allergic rhinitis, unspecified: Secondary | ICD-10-CM

## 2021-11-13 DIAGNOSIS — G4733 Obstructive sleep apnea (adult) (pediatric): Secondary | ICD-10-CM

## 2021-11-13 DIAGNOSIS — I4891 Unspecified atrial fibrillation: Secondary | ICD-10-CM

## 2021-11-13 DIAGNOSIS — H269 Unspecified cataract: Secondary | ICD-10-CM

## 2021-11-13 DIAGNOSIS — K289 Gastrojejunal ulcer, unspecified as acute or chronic, without hemorrhage or perforation: Secondary | ICD-10-CM

## 2021-11-13 DIAGNOSIS — J45909 Unspecified asthma, uncomplicated: Secondary | ICD-10-CM

## 2021-11-13 DIAGNOSIS — M797 Fibromyalgia: Secondary | ICD-10-CM

## 2021-11-13 DIAGNOSIS — I4811 Longstanding persistent atrial fibrillation: Secondary | ICD-10-CM

## 2021-11-13 DIAGNOSIS — C7A8 Other malignant neuroendocrine tumors: Secondary | ICD-10-CM

## 2021-11-13 DIAGNOSIS — E669 Obesity, unspecified: Secondary | ICD-10-CM

## 2021-11-13 DIAGNOSIS — I10 Essential (primary) hypertension: Secondary | ICD-10-CM

## 2021-11-13 DIAGNOSIS — Z8719 Personal history of other diseases of the digestive system: Secondary | ICD-10-CM

## 2021-11-13 DIAGNOSIS — K8689 Other specified diseases of pancreas: Secondary | ICD-10-CM

## 2021-11-13 DIAGNOSIS — R079 Chest pain, unspecified: Secondary | ICD-10-CM

## 2021-11-13 DIAGNOSIS — E785 Hyperlipidemia, unspecified: Secondary | ICD-10-CM

## 2021-11-13 DIAGNOSIS — I513 Intracardiac thrombosis, not elsewhere classified: Secondary | ICD-10-CM

## 2021-11-13 DIAGNOSIS — E039 Hypothyroidism, unspecified: Secondary | ICD-10-CM

## 2021-11-13 DIAGNOSIS — M1990 Unspecified osteoarthritis, unspecified site: Secondary | ICD-10-CM

## 2021-11-13 DIAGNOSIS — J449 Chronic obstructive pulmonary disease, unspecified: Secondary | ICD-10-CM

## 2021-11-13 DIAGNOSIS — N186 End stage renal disease: Secondary | ICD-10-CM

## 2021-11-13 DIAGNOSIS — I4819 Other persistent atrial fibrillation: Secondary | ICD-10-CM

## 2021-11-13 DIAGNOSIS — K219 Gastro-esophageal reflux disease without esophagitis: Secondary | ICD-10-CM

## 2021-11-13 DIAGNOSIS — M26609 Unspecified temporomandibular joint disorder, unspecified side: Secondary | ICD-10-CM

## 2021-11-13 DIAGNOSIS — R7303 Prediabetes: Secondary | ICD-10-CM

## 2021-11-13 NOTE — Progress Notes
Date of Service: 11/13/2021    Shelby Rogers is a 70 y.o. female.       Medical assistant/scheduling obtained patient's verbal consent to treat them and their agreement to [facility] financial policy and NPP via this telehealth visit during the [facility] Emergency. This video home visit was conducted via a call between the patient and physician/provider .     Provider Call Started at [phone]     Atrial Fibrillation ACTION Plan  ?  **Please consider taking a picture of this and keeping in your phone or keep a copy in your wallet/purse**  ?  For most patients with history of atrial fibrillation, episodes of atrial fibrillation are not a medical emergency as long as the risk for stroke has been assessed and, if indicated, appropriate protection is provided (triage, anticoagulation, rate control). Your provider would like you to review the following instructions for various aspects of management if you have an episode of atrial fibrillation (triage, anticoagulation, heart rate control, and heart rhythm control).  ?  PART I: TRIAGE  ?  RED LIGHT -- Please go directly to the ED or call 911 if you have...  ? Symptoms of a possible stroke: Difficulty speaking, loss of vision, change in ability to balance yourself, numbness/tingling/weakness limited to one side of the body  ? An episode of passing out  ? At least 15 minutes and ongoing new onset shortness of breath that prevents you from being able to get around where you live   ? At least 15 minutes chest pain/pressure at rest (particularly if it is persisting)  ?  YELLOW LIGHT -- Please contact your EP physician by Mychart or call [phone] during business hours if you have...  ? Episodes of atrial fibrillation lasting more than 1 hour or increasing in duration, frequency, or severity.  ? A new symptom with or concern about atrial fibrillation that you have questions about.  ?  GREEN LIGHT -- Please contact your EP physician by Mychart or call [phone] during business hours if you have...  ? You can continue with your current management plan as long as episodes last less than 1 hour and you do not have any ?red light? symptoms  ? Please keep a log of the date, time, duration, and heart rate during your episodes of atrial fibrillation if known.   ?  PART II: ANTICOAGULATION (BLOOD THINNER) AND STROKE RISK  ?  If you have an episode of atrial fibrillation your provider would like you to: Continue current daily blood thinner  ?  PART III: HEART RATE CONTROL  ?  If you have an episode of atrial fibrillation your provider would like you to: Continue your current heart rate control medications.  ?  PART IV: HEART RHYTHM CONTROL  ?  ? If you have an episode of atrial fibrillation your provider would like you to: Continue your current medications.              History of Present Illness:     See Medical History/Course Below    [name]?is a very pleasant 70 y.o.?woman?referred by Durwin Reges, MD?and Dr.?de [facility].??She lives in [place], [place]. ?She has a daughter Shelby Rogers). 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 ?She now enjoys church related activities. She has a dog (recently obtained as of 11/2021)    Ms. Shelby Rogers presents today via telehealth for a follow-up. She was last seen in 12/2020.    She reports  that she is doing well. She has been experiencing episodes of dizziness and being off balance since 04/2021. She is currently attending physical therapy. She has been referred to neurology. She had some blood work done. She underwent a brain MRI and a CT on her sinuses. She is now seeing ENT. She states that the brain MRI detected a severe sinus infection. Since she had the brain MRI, they did a CT scan of her sinuses and she now has another sinus infection on the left side. She does have a deviated septum.    Ms. Shelby Rogers notes her heart has been doing well. She has not experienced any issues. She has been monitoring her heart rate. She denies any abnormalities. She is still taking Xarelto, flecainide, and diltiazem 180 mg in the morning and 120 mg at night. She recently obtained a dog and she is walking 4 to 5 times a day. She does experience some balance issues when she is walking, but she is not experiencing the dizziness while she is walking.    Her blood pressures have been elevated recently. She tries never to miss her medications. She tries to watch her diet because she is trying to lose weight. She has not completed the 1 week monitor.    She reports that her CPAP is on a recall; however, she is still using it. She reports that she talked to Dr. Nechama Guard on 11/12/2021 and he is going to order another titration study. She has been on 8 mmHg.            Impression:     #?Long-standing Persistent??atrial fibrillation?s/p ablation  # Prior LAA thrombus?(on apixaban x a bit but possibly not months)  Follows with Dr. Mickle Mallory (and Dr. Marcie Mowers) and Dr. Nechama Guard  -CHADS2VASC of?3+?ALSO PRIOR LAA THROMBUS?(age3, HTN, woman) --?Warfarin -- prior apixaban (had LAA thrombus)  -Monitoring: home blood pressure machine  -AAD:?diltiazem?-- prior?flecainide (50 bid, failed)  -Sx:?decreased energy,?shortness of breath,?palpitations, fatigue?(04/25/20, improved post-ablation)  -OSA:?on CPAP  -EtOH:?denies (04/25/20)  -March 2020: Dx?(may have been earlier)  -02/25/2019 - ECHO: ?([facility]) EF = 74%. ?No MVP noted.   Apixaban initially  -04/13/2019 - TEE: ?Normal left ventricular systolic function. ?EF of 60% with gross normal wall motion. ?Trivial mitral valve regurgitation. ?Upper limits of normal left atrial size. ?Left atrial appendage thrombus. ?Minimal aortic sclerosis with no evidence of stenosis or insufficiency. ?Upper limits of normal to mildly dilated aortic root. ?Normal right ventricular function. ?Trivial to mild tricuspid regurgitation. ?Right atrium with no evidence of enlargement. ?No evidence of PFO or ASD. ?No evidence of pericardial disease. ?Descending thoracic aorta with no atheroma.  -05/10/2019 -?Cardiac Catheterization: ?([facility]) ?LV systolic function is normal. ?Nonobstructive CAD. ?Mildly elevated LVEDP. ?  -06/30/2019 - TEE?& DCCV:??([facility]) ?Normal left ventricular systolic function, EF of 60%?with gross normal wall motion. Concentric left ventricular hypertrophy. Mild mitral valve regurgitation. Mild left atrial enlargement.?No evidence of thrombus within the appendage. 4/4 pulmonary veins identified. There is evidence of aortic valve sclerosis with no evidence of stenosis or insufficiency. ?Normal aortic root size. Normal right ventricular function with mild tricuspid regurgitation. Upper limits of normal right atrial size with no evidence of PFO or ASD. Trivial pulmonic insufficiency with no evidence of pericardial effusion and minimal class I atheroma -- held for about 1.5 months  -04/25/20 Dr. Deitra Craine:?Persistent and essentially longstanding persistent atrial fibrillation. ?Uncertain left atrial dimension. ?Need to sort out timing of Whipple surgery before proceeding with ablation or cardioversion given need  for uninterrupted anticoagulation for a bit after either of these procedures. ?Could consider amiodarone temporarily to bridge through surgery and then proceed with ablation in the future.  -07/12/2020 CT scan: Mild left atrial enlargement, LA volume including LAA at 124 mL, no LAA thrombus, 2 left and 2 right pulmonary veins, at least mild calcified coronary plaque, small hiatal hernia, otherwise significant extracardiac abnormality  -07/17/20 [name]?  -07/19/20 TEE: No LAA thrombus, mild left atrial enlargement, no interatrial shunt. LVEF 55%  -07/19/20 Ablation ([name]): Longstanding persistent AF ablation (RF WACA/PVI and posterior wall isolation). ?Arrived to the lab in atrial fibrillation. ?Challenging transseptal due to lipomatous septal hypertrophy required several additional drop-downs. ?Double transseptal performed with BRK-1 with LA pressure of 26/11. ?Voltage map of LA during AF at 0.1-0.45 mV demonstrated mild septal/periantral/posterior wall scar. ?There was also CFAE?at the septum. ?Esophagus was towards the right sided veins and thus elected to proceed with left-sided WACA initially. ?This isolated without difficulty. ?Esosure utilized to move the esophagus more towards the right during right sided WACA. ?Had some temperature rises along the lower posterior wall from 35.8 C up to 36.6C?after coming off?at?0.2C?rise. ?First-pass isolation achieved with right-sided WACA. ?Persistent independent firing from right sided pulmonary veins particularly after Isuprel washout. ?Given ongoing atrial fibrillation elected to proceed with PWI?with a roofline followed by a floor line. ?External cardioversion x1 at 300 J. ?At this juncture there were some far field signals along the posterior wall but pacing from the posterior wall suggested the most likely potential connection was along the floor. ?Mapped activation into the lower portion of the posterior wall and proceeded with ablation along the lower half of the posterior wall which achieved entrance block. ?Pacing was then performed throughout regions of the posterior wall at 20 mA / 2 ms followed by ablation until exit block was also present. ?Exit block persisted after waiting 27 minutes. ?Burst atrial pacing at 240x5 and 220x5 with no inducible arrhythmia. ?Isoproterenol 20 mcg/min x 5 minutes with multifocal atrial ectopy some of which may have been coming from the LAA. ?Some ectopy?was from the right and some was from the left atrium. ?It was too infrequent to map. ?Repeat voltage map performed and confirmed PVI/PWI. ?Wait?time between PVI and rechecking of >45 minutes. ?We will go ahead and initiate dofetilide but there is the possibility of discontinuation of dofetilide in the future depending on her course.  -07/19/20-07/20/20 [facility] admission: Initiated dofetilide.  Unfortunately, QTC was excessively prolonged after the first dose at 500 mcg.  Elected to utilize flecainide 75 mg twice daily.  Would not rule out the possibility of using dofetilide 250 mcg twice daily in the future.  -07/23/20: went to the ED at [facility] and has been admitted to their observation unit. She states she was having CP, SOA, and tightness that wouldn't let up. -- 4d hospitalization, colchicine, indomethacin  -08/24/2020 [name]: Maintaining sinus rhythm on flecainide, plan to f/u in 3 mo with SHS with 7 day Holter prior. Ongoing mild CP. Given 7 days of colchicine.  -10/10/20 Ziopatch: 7 days?demonstrates: ?1) no atrial fibrillation/flutter; 2) 2 episodes of nonsustained SVT up to 4 beats in duration; 3) symptom events occasionally correlated with a very rare PAC or PVC; 4) no VT; 5) <1% PACs; 6) <0.1% PVCs; 7) sinus rhythm with rates between 44-109 bpm and average 59 bpm.  -12/11/2020 [name]: No recurrences.  Feels better with rhythm control.  Continue indefinite anticoagulation and will switch warfarin to Xarelto.  Continue  flecainide 75 twice daily until at least 1 year out from ablation.  -06/08/2021: 24-hour Holter: 1.7% PVCs, 6.4% PACs, symptom events correlating with sinus rhythm.  No SVT or atrial fibrillation.  -11/13/2021 [name]: Doing well.  No recurrence.  Continue flecainide.  Obtain 7-day Zio patch that was not done the summer.  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  -04/25/20 Upcoming Whipple?~ September or sooner  -05/25/20: Planned PET  -06/22/20: Dr. Roselle Locus: RTC with FU scans in 3 mo Declined Whipple??-  -09/13/2021 Dr. 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 ?She does have a small pancreatic head mass which is undergoing further evaluation by GI. ?  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  We will continue flecainide given the longstanding persistent nature of her atrial fibrillation previously.  She is maintaining sinus rhythm well.  We will screen for asymptomatic recurrences of atrial fibrillation with a 7-day Ziopatch monitor.  Presuming this looks good she can transition to follow-up with [place] as needed as she has regular cardiology follow-up locally.  Her risk for stroke remains elevated.  She should continue indefinite anticoagulation given her history of this and prior history of LAA thrombus.  She will continue Xarelto.  Labs will be done locally.    Her blood pressure is suboptimally controlled.  Discussed the possibility of adding a medication versus doubling down on dietary sodium restriction.  She prefers to work on sodium restriction initially.  She will continue valsartan in the meantime.    Discussed the importance of continuing to address sleep apnea.  She reports compliance with her CPAP but will be undergoing a repeat titration study.    She has lost some weight.  This is excellent news.  Hopefully she can continue.    It is a pleasure sharing in the care of this patient.  I will plan to follow-up with her as needed at this juncture.     Plan:     -7 day Ziopatch  -Please review the atrial fibrillation action plan printed for you and sent via MyChart  -Monitoring:   -Please check the regularity and rate of your pulse with your BP cuff  at least once daily and at times of symptoms as an abnormality could be suggestive of atrial fibrillation  -Anticoagulation: Continue anticoagulation with rivaroxaban (Xarelto) 20 mg daily (must be taken with food)    -Rate control:  Continue Continue diltiazem 180 mg daily in the morning and 120 mg at night   -Rhythm control: Continue flecainide 75 mg twice daily    -Repeat stress test in 2025 or earlier if symptoms in the interim  -Lifestyle recommendations:  -Recommend abstaining from all alcohol use  -  Light to moderate caffeine intake is acceptable.  Please make sure to have less than 200 mg of caffeine daily.  -Recommend efforts at long-term weight loss  -Recommend regular aerobic exercise at least 3-5 days per week (great job with cardiac rehabilitation)  -Agree with repeat CPAP titration  -Blood pressure management:  -Continue valsartan 80 mg twice daily  -Please check you blood pressure  at least three times weekly, keep a log (bring to next visit)  -- please contact us or your primary physician if your systolic blood pressure (top #) is often >130 mmHg  -Please avoid use of NSAID medications (ibuprofen/Aleve, naproxen/Advil, etc) as these can raise the blood pressure  -Please limit dietary sodium intake (less than 2,000 mg/day)  -Follow-up: as needed (has local FU)          Please call us with any questions or concerns at [phone].    Medication List:     ? albuterol sulfate (PROAIR HFA) 90 mcg/actuation HFA aerosol inhaler Inhale 2 puffs by mouth into the lungs every 6 hours as needed for Wheezing or Shortness of Breath. Shake well before use.   ? albuterol-ipratropium (DUONEB) 0.5 mg-3 mg(2.5 mg base)/3 mL nebulizer solution Inhale 3 mL solution by nebulizer as directed four times daily as needed for Wheezing.   ? dilTIAZem CD (CARDIZEM CD) 120 mg capsule Take 120 mg by mouth at bedtime daily.   ? dilTIAZem LA (CARDIZEM LA) 180 mg tablet 180 mg daily. Indications: takes 180mg  in AM   ? doxycycline hyclate (VIBRACIN) 100 mg tablet    ? famotidine (PEPCID) 20 mg tablet Take 20 mg by mouth twice daily.   ? fexofenadine (ALLEGRA) 180 mg tablet Take 180 mg by mouth daily.   ? fish oil /omega-3 fatty acids (SEA-OMEGA) 340/1000 mg capsule Take 1 capsule by mouth daily.   ? flecainide (TAMBOCOR) 150 mg tablet TAKE ONE-HALF TABLET BY MOUTH TWICE DAILY.   ? fluticasone (FLONASE) 50 mcg/actuation nasal spray Apply 2 Sprays to each nostril as directed daily.   ? levothyroxine (SYNTHROID) 25 mcg tablet Take 25 mcg by mouth every morning.   ? montelukast (SINGULAIR) 10 mg tablet Take 10 mg by mouth every morning.   ? ondansetron HCL (ZOFRAN) 4 mg tablet    ? ondansetron HCL (ZOFRAN) 8 mg tablet Take 8 mg by mouth every 8 hours as needed.   ? other medication 1 Dose. Multivitamin with 2000 units Vitamin D and 100mg  CoQ 10   ? pantoprazole DR (PROTONIX) 40 mg tablet Take one tablet by mouth twice daily. Starting 1 week prior to procedure and for 1 month post procedure.   ? potassium chloride SR (K-DUR) 20 mEq tablet Take one tablet by mouth daily. Take with a meal and a full glass of Shelby.   ? rivaroxaban (XARELTO) 20 mg tablet Take one tablet by mouth daily with dinner. Take with food.   ? senna (SENOKOT) 8.6 mg tablet Take one tablet by mouth daily.   ? tiotropium bromide (SPIRIVA RESPIMAT) 2.5 mcg/actuation inhaler Inhale 2 puffs by mouth into the lungs daily.   ? valsartan (DIOVAN) 80 mg tablet Take 80 mg by mouth twice daily.   ? zolpidem (AMBIEN) 5 mg tablet Take 2.5 mg by mouth nightly as needed.            Social History:     Social History     Socioeconomic History   ? Marital status: Single   Tobacco Use   ? Smoking  status: Former     Packs/day: 1.00     Years: 8.00     Pack years: 8.00     Types: Cigarettes     Quit date: 03/01/1985     Years since quitting: 36.7   ? Smokeless tobacco: Never   Vaping Use   ? Vaping Use: Never used   Substance and Sexual Activity   ? Alcohol use: No     Alcohol/week: 0.0 standard drinks   ? Drug use: No       Family History:     Family History   Problem Relation Age of Onset   ? Diabetes Mother    ? Heart Disease Father    ? Thyroid Disease Sister    ? Thyroid Disease Brother        Pertinent Studies:       Echo Results  (Last 3 results in the past 3 years)    Echo EF LVIDD LA Size IVS LVPW Rest PAP    (07/19/20)   55    -- -- -- -- --                     Past Medical History  Patient Active Problem List    Diagnosis Date Noted   ? Chronic anticoagulation 04/25/2020     Warfarin. Goal INR 2-3     ? Longstanding persistent atrial fibrillation [facility]) 04/24/2020     02/25/2019 - ECHO:  [facility]) EF = 74%.  No MVP noted.   04/13/2019 - TEE - LAA thrombus  05/10/2019 - Cardiac Catheterization:  [facility])  LV systolic function is normal.  Nonobstructive CAD.  Mildly elevated LVEDP.    06/30/2019 - TEE + DCCV.  [facility])  Direct current cardioversion 200 joules synchronized energy single shock.  07/12/2020 - Cardiac CTA:  Mild left atrial enlargement, LA volume including LAA at 124 mL, no LAA thrombus, 2 left and 2 right pulmonary veins, at least mild calcified coronary plaque, small hiatal hernia, otherwise significant extracardiac abnormality.  07/19/2020 -  Successful Persistent atrial fibrillation ablation and posterior wall isolation by [name]  07/19/2020 - Dofetilide initiated post ablation, QTc prolongation after first dose, discontinued.  10/10/2020 - Zio Patch:  Ziopatch Ambulatory ECG Monitoring for approximately 7 days demonstrates:  1) no atrial fibrillation/flutter; 2) 2 episodes of nonsustained SVT up to 4 beats in duration; 3) symptom events occasionally correlated with a very rare PAC or PVC; 4) no VT; 5) <1% PACs; 6) <0.1% PVCs; 7) sinus rhythm with rates between 44-109 bpm and average 59 bpm.     ? Asthma 04/24/2020   ? COPD (chronic obstructive pulmonary disease) (HCC) 04/24/2020   ? Fibromyalgia 04/24/2020   ? GERD (gastroesophageal reflux disease) 04/24/2020   ? Hypothyroid 04/24/2020     On replacement       ? 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 LA thrombus 04/24/2020     04/13/2019 - TEE:  Normal left ventricular systolic function. ?EF of 60% with gross normal wall motion. ?Trivial mitral valve regurgitation. ?Upper limits of normal left atrial size. ?Left atrial appendage thrombus. ?Minimal aortic sclerosis with no evidence of stenosis or insufficiency. ?Upper limits of normal to mildly dilated aortic root. ?Normal right ventricular function. ?Trivial to mild tricuspid regurgitation. ?Right atrium with no evidence of enlargement. ?No evidence of PFO or ASD. ?No evidence of pericardial disease. ?Descending thoracic aorta with no atheroma.  06/30/2019 - TEE:  [facility])  Normal left ventricular systolic function, EF of 60% with gross normal wall motion. Concentric left ventricular hypertrophy. Mild mitral valve regurgitation. Mild left atrial enlargement. No evidence of thrombus within the appendage. 4/4 pulmonary veins identified. There is evidence of aortic valve sclerosis with no evidence of stenosis or insufficiency.  Normal aortic root size. Normal right ventricular function with mild tricuspid regurgitation. Upper limits of normal right atrial size with no evidence of PFO or ASD. Trivial pulmonic insufficiency with no evidence of pericardial effusion and minimal class I atheroma       ? OSA on CPAP 04/24/2020   ? Essential hypertension 04/24/2020   ? 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 Urinary urgency 10/17/2019     08/23/2019:  Urinary urgency and nocturia.  No leakage.  No pads.  Tried Ditropan, but thought put into near retention     ? Fatigue 04/14/2017     Increased difficulty sleeping     ? Arthralgia 04/14/2017   ? Constipation 05/22/2015   ? Renal cell carcinoma (HCC) 05/02/2015     4cm right inferior pole solid mass - incidental finding on CT for salmonella poisoning  Patient referred for consideration of intervention for persistent RUQ pain    04/25/15 - R robotic partial nephrectomy (pT1bNxMn/a, FG2, margins (-), tumor necrosis)  05/22/15 - + constipation and abdominal pain. Creatinine 0.89  01/04/16: Doing well from a kidney cancer standpoint. Creatinine stable at 0.89. CXR with NED. CT A/P pending final read. We will plan to call Ms. Loberg with any abnormal results.     10/14/16: Doing well from kidney cancer standpoint; Cr stable at 0.80. RBUS with NED    04/14/17: New edema working with PMD, Cr 0.91, CXR negative, CT Abd/Pel with and without contrast NED, UA negative    04/13/18: Patient overall doing well. Having frequency and urgency. CT unremarkable. Cr 0.8.     08/23/2019:  No evidence of recurrence on labs, CXR, or CT Scan.  She does have a small pancreatic head mass which is undergoing further evaluation by GI.      11/16/2020:  No evidence of recurrence of kidney cancer on labs or imaging.  She is undergoing further evaluation and likely surgery for a neuroendocrine pancreatic tumor.     ? Kidney mass 04/25/2015   ? Renal mass 03/06/2015     4cm right inferior pole solid mass - incidental finding on CT for salmonella poisoning  Patient referred for consideration of intervention for persistent RUQ pain           Review of Systems   Constitutional: Negative.   HENT: Negative.    Eyes: Negative.    Cardiovascular: Negative.    Respiratory: Negative.    Endocrine: Negative.    Hematologic/Lymphatic: Negative.    Skin: Negative.    Musculoskeletal: Negative.    Gastrointestinal: Negative.    Genitourinary: Negative.    Neurological: Positive for dizziness and loss of balance.        In physical therapy for these symptoms. She states she has seen ENT, Neuro, and physical therapy and it off and on improves.   Psychiatric/Behavioral: Negative. Allergic/Immunologic: Negative.         Telehealth Patient Reported Vitals     Row Name 11/13/21 0921 11/13/21 0916             BP: 144/81 --       BP Source: Arm, Left Upper --       BP  Position: Sitting --       Pulse: 61 --       Weight: 100.2 kg (221 lb) --       Pain Score: Zero --       Pain Score -- Zero       Patient Position -- Sitting       BP Source -- Arm, Left Upper                      GENERAL APPEARANCE: Patient in no apparent distress.  PSYCH: Appropriate  NEURO: Alert and conversant     Problems Addressed Today  Encounter Diagnoses   Name Primary?   ? Longstanding persistent atrial fibrillation (HCC) Yes   ? Other persistent atrial fibrillation (HCC)    ? Obesity, Class II, BMI 35-39.9    ? Essential hypertension    ? OSA (obstructive sleep apnea)        I spent over  >30 minutes for the visit including some of the following: preparing to see the patient, history/exam, placing orders, documenting the visit, communicating with care team, interpreting tests, and care coordination/communication.           ATTESTATION  This visit was documented by [employer] via audio recorded by Zollie Pee, MD, on November 13, 2021, at 12:04 PM.

## 2021-11-15 ENCOUNTER — Encounter: Admit: 2021-11-15 | Discharge: 2021-11-15 | Payer: MEDICARE

## 2021-11-16 ENCOUNTER — Encounter: Admit: 2021-11-16 | Discharge: 2021-11-16 | Payer: MEDICARE

## 2021-11-16 MED ORDER — XARELTO 20 MG PO TAB
ORAL_TABLET | Freq: Every day | ORAL | 3 refills | 30.00000 days | Status: AC
Start: 2021-11-16 — End: ?

## 2021-11-19 ENCOUNTER — Encounter: Admit: 2021-11-19 | Discharge: 2021-11-19 | Payer: MEDICARE

## 2021-11-19 ENCOUNTER — Ambulatory Visit: Admit: 2021-11-19 | Discharge: 2021-11-19 | Payer: MEDICARE

## 2021-11-19 DIAGNOSIS — I4811 Longstanding persistent atrial fibrillation: Secondary | ICD-10-CM

## 2022-03-08 IMAGING — MR MR head/brain wo/w con
7 of 12 series · 28 of 48 positions shown · IV contrast (prohance)
Comparison: MRI head report August 03, 2021 (images not available).

Procedure(s): MR head/brain wo/w con

MRI HEAD WITH AND WITHOUT CONTRAST
CLINICAL DATA: Demyelinating disease. History of gait disturbance.
TECHNIQUE: Multiplanar / multi-pulse sequence images were obtained
before and after the
administration of ProHance 15 mL.

[Series 2: T1 · sagittal · 4.0mm · 0.47mm/px · 5 of 25 slices shown (1 of 2)]
[im 1/25]
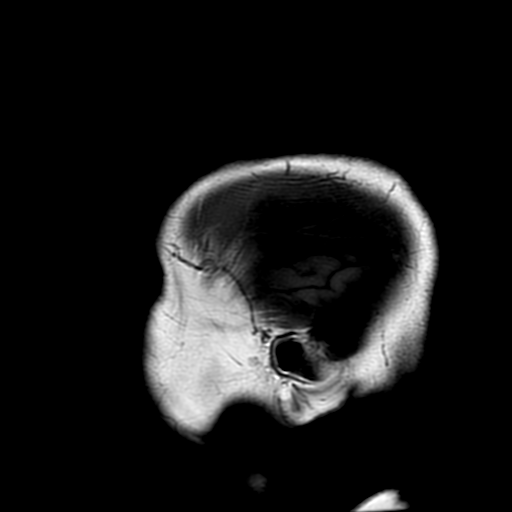
[im 7/25]
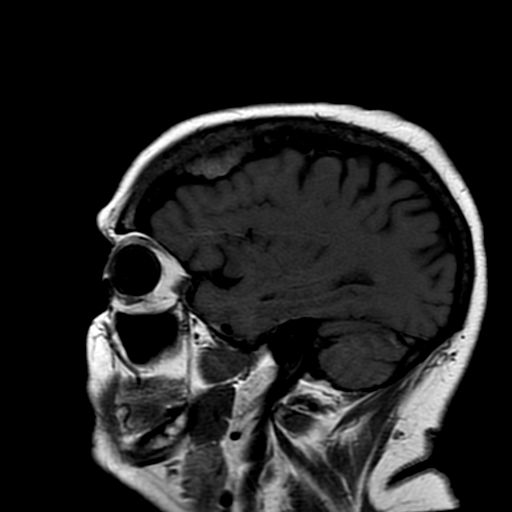
[im 13/25]
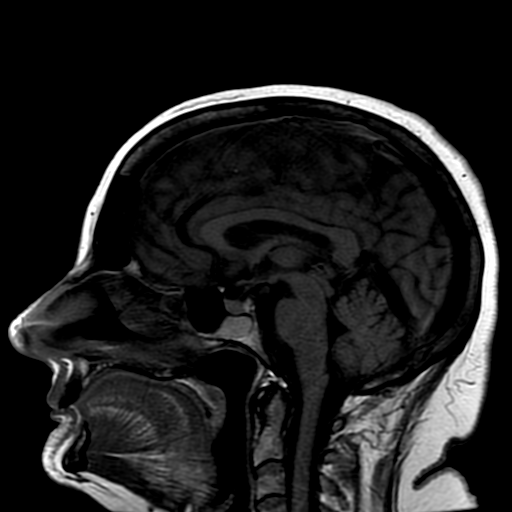
[im 19/25]
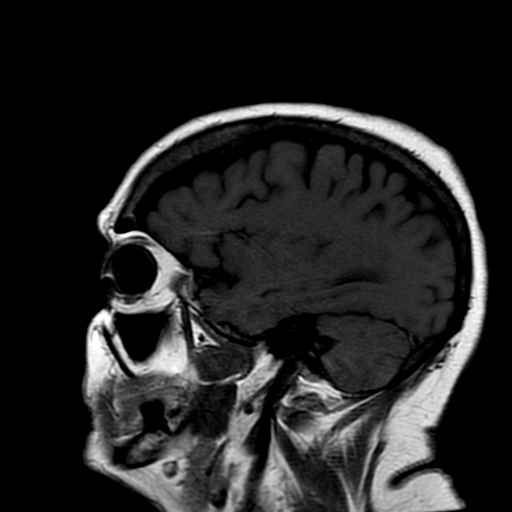
[im 25/25]
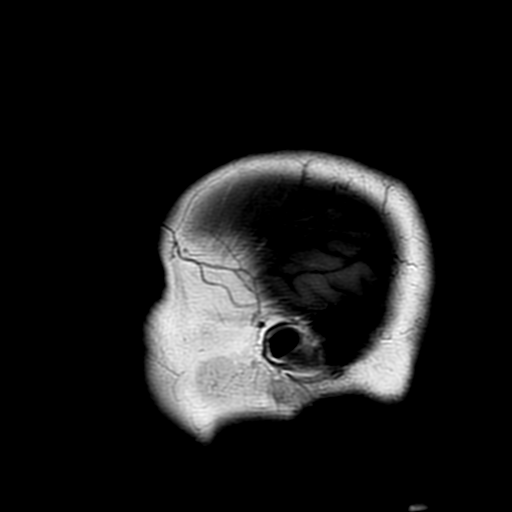

[Series 3: FLAIR · sagittal · 4.0mm · 0.47mm/px · 4 of 25 slices shown (1 of 2)]
[im 1/25]
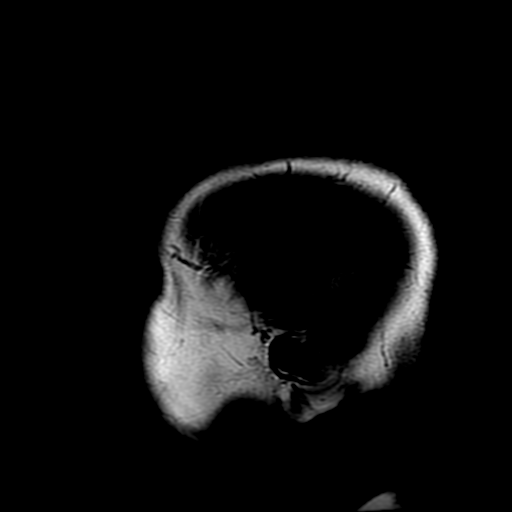
[im 9/25]
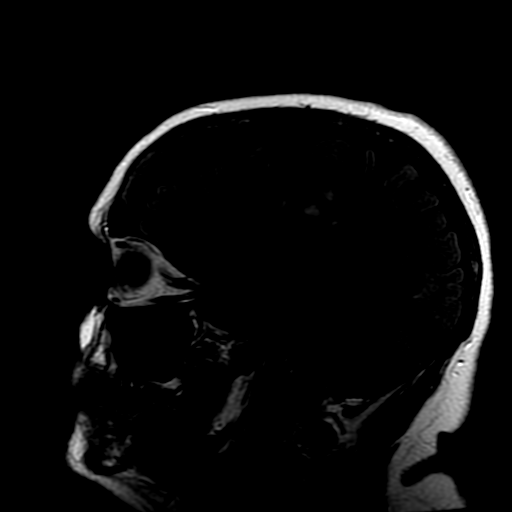
[im 17/25]
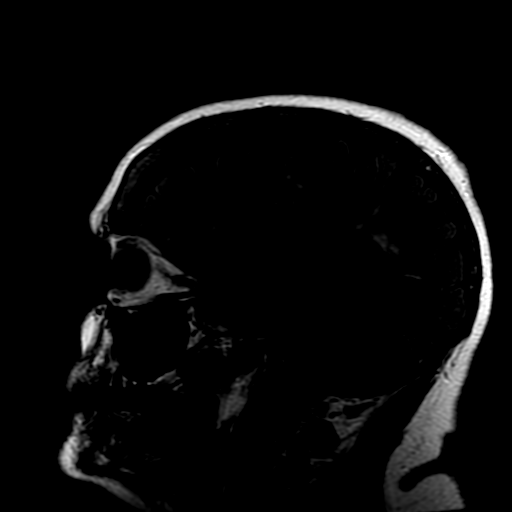
[im 25/25]
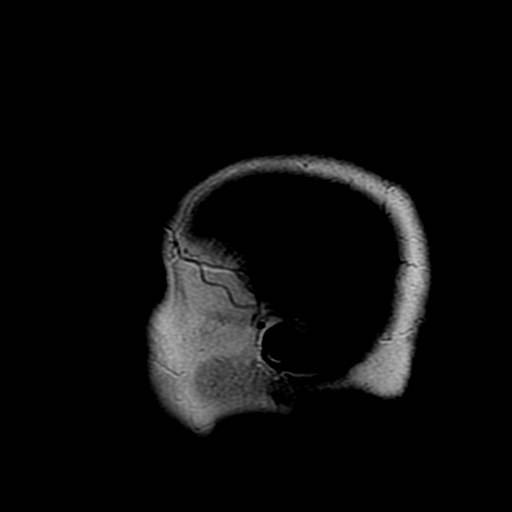

[Series 4: DWI · axial · 5.0mm · 0.94mm/px · z∈[-40,+87]mm · 7 of 46 slices shown]
[im 1/46]
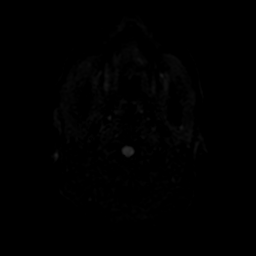
[im 8/46]
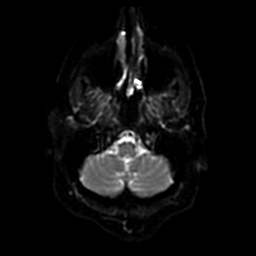
[im 16/46]
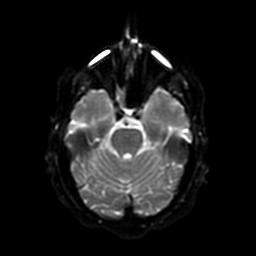
[im 23/46]
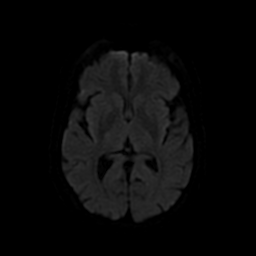
[im 31/46]
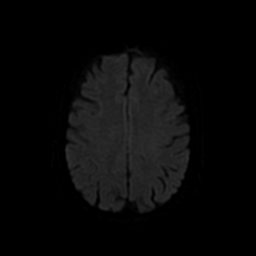
[im 38/46]
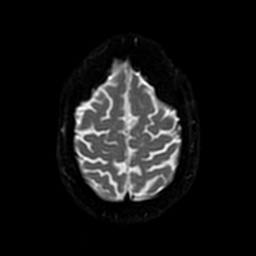
[im 46/46]
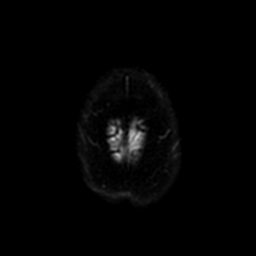

[Series 5: T2 · axial · 5.0mm · 0.45mm/px · z∈[-49,+82]mm · 3 of 22 slices shown]
[im 1/22]
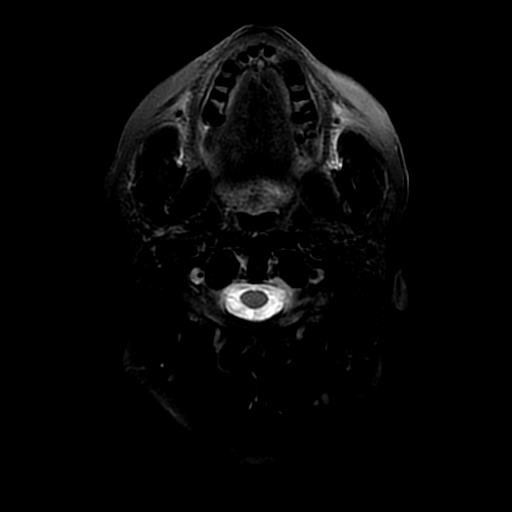
[im 11/22]
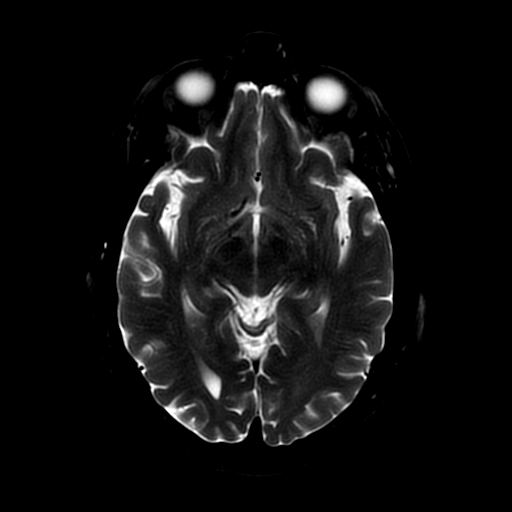
[im 22/22]
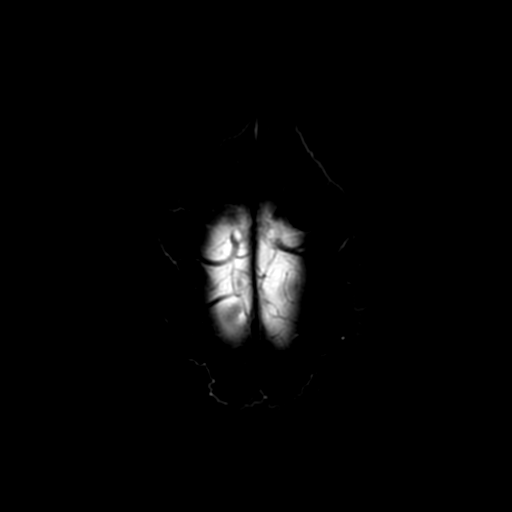

[Series 6: FLAIR · axial · 5.0mm · 0.90mm/px · z∈[-49,+82]mm · 3 of 22 slices shown (2 of 2)]
[im 1/22]
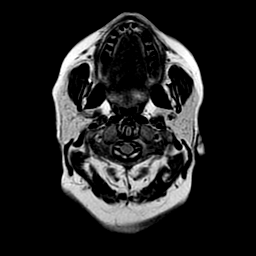
[im 11/22]
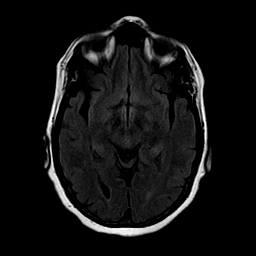
[im 22/22]
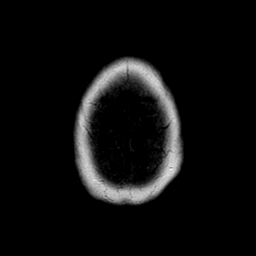

[Series 7: T1 · axial · 5.0mm · 0.45mm/px · z∈[-49,+13]mm · 2 of 22 slices shown (2 of 2)]
[im 1/22]
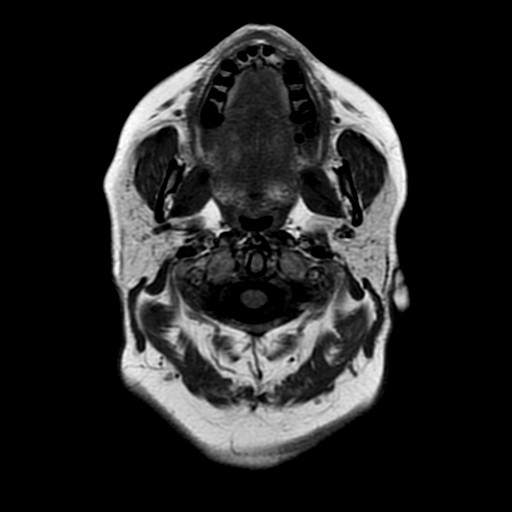
[im 11/22]
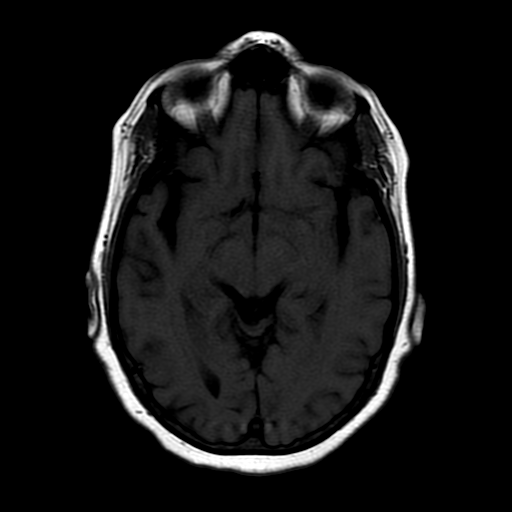

[Series 450: ADC · axial · 5.0mm · 0.94mm/px · z∈[-40,+87]mm · 4 of 23 slices shown]
[im 1/23]
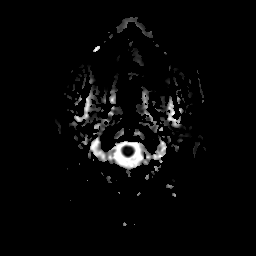
[im 8/23]
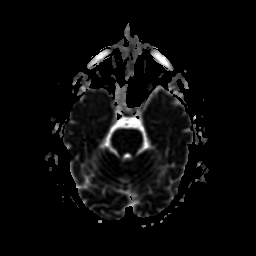
[im 15/23]
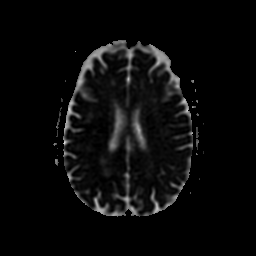
[im 23/23]
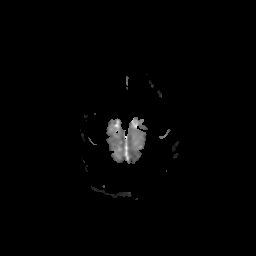

[28 of 48 positions shown; findings below may reference images not displayed]

FINDINGS: VENTRICLES: The ventricles, sulci and basilar cisterns are
normal size for age
and are symmetric.

BRAIN: Numerous small abnormal white matter foci are present
in both cerebral
hemispheres, particular in a periventricular distribution.
Many of these are
oriented perpendicular to the corpus callosum and there may
be additional
callosal involvement. Otherwise the brain parenchyma is
preserved. The normal
arterial flow voids are patent. No acute ischemia,
intraparenchymal hemorrhage,
or mass effect is present. Postcontrast imaging reveals no
abnormal enhancement.

DURA: Unremarkable.

PITUITARY: The pituitary shows no obvious abnormalities.

ORBITS: The orbits are unremarkable.

SINUSES: There is complete opacification of the right
sphenoid sinus. The
mastoid air cells and middle ear cavities are aerated.

OTHER: No other significant findings are detected.
IMPRESSION: 1. Numerous abnormal white matter foci, too numerous to
count, largest about 11
mm. Many of these are in a periventricular distribution with
perpendicular
alignment suggesting the possibility of demyelination.
Age-related chronic
microvascular ischemia is probably a contributing factor.

2. No abnormal enhancement that would suggest active
demyelination.

## 2022-03-13 ENCOUNTER — Encounter: Admit: 2022-03-13 | Discharge: 2022-03-13 | Payer: MEDICARE

## 2022-03-13 DIAGNOSIS — D3A8 Other benign neuroendocrine tumors: Secondary | ICD-10-CM

## 2022-03-19 ENCOUNTER — Encounter: Admit: 2022-03-19 | Discharge: 2022-03-19 | Payer: MEDICARE

## 2022-03-21 ENCOUNTER — Encounter: Admit: 2022-03-21 | Discharge: 2022-03-21 | Payer: MEDICARE

## 2022-03-21 NOTE — Telephone Encounter
Pt LVM needing to r/s appts on 5/9.     Contacted pt, her daughter is her driver and she didn't know her grandson has doctor's appt on 5/9. Pt will need to r/s lab, PET scan, Dr. Nancy Fetter RV and requesting a pm appt d/t 4 hr drive. Someone from our scheduling team will call pt to r/s. Pt v/u.

## 2022-03-22 ENCOUNTER — Encounter: Admit: 2022-03-22 | Discharge: 2022-03-22 | Payer: MEDICARE

## 2022-03-22 NOTE — Telephone Encounter
Called pt back to r/s appts with sun/lab/PET all in same day, this RN told her new schedule, pt understood no questions.

## 2022-04-09 ENCOUNTER — Encounter: Admit: 2022-04-09 | Discharge: 2022-04-09 | Payer: MEDICARE

## 2022-04-25 ENCOUNTER — Encounter: Admit: 2022-04-25 | Discharge: 2022-04-25 | Payer: MEDICARE

## 2022-04-25 DIAGNOSIS — D3A8 Other benign neuroendocrine tumors: Secondary | ICD-10-CM

## 2022-04-26 ENCOUNTER — Encounter: Admit: 2022-04-26 | Discharge: 2022-04-26 | Payer: MEDICARE

## 2022-04-30 ENCOUNTER — Ambulatory Visit: Admit: 2022-04-30 | Discharge: 2022-04-30 | Payer: MEDICARE

## 2022-04-30 ENCOUNTER — Encounter: Admit: 2022-04-30 | Discharge: 2022-04-30 | Payer: MEDICARE

## 2022-04-30 DIAGNOSIS — R7303 Prediabetes: Secondary | ICD-10-CM

## 2022-04-30 DIAGNOSIS — I513 Intracardiac thrombosis, not elsewhere classified: Secondary | ICD-10-CM

## 2022-04-30 DIAGNOSIS — N186 End stage renal disease: Secondary | ICD-10-CM

## 2022-04-30 DIAGNOSIS — J309 Allergic rhinitis, unspecified: Secondary | ICD-10-CM

## 2022-04-30 DIAGNOSIS — K8689 Other specified diseases of pancreas: Secondary | ICD-10-CM

## 2022-04-30 DIAGNOSIS — H269 Unspecified cataract: Secondary | ICD-10-CM

## 2022-04-30 DIAGNOSIS — R079 Chest pain, unspecified: Secondary | ICD-10-CM

## 2022-04-30 DIAGNOSIS — J45909 Unspecified asthma, uncomplicated: Secondary | ICD-10-CM

## 2022-04-30 DIAGNOSIS — M797 Fibromyalgia: Secondary | ICD-10-CM

## 2022-04-30 DIAGNOSIS — E039 Hypothyroidism, unspecified: Secondary | ICD-10-CM

## 2022-04-30 DIAGNOSIS — I10 Essential (primary) hypertension: Secondary | ICD-10-CM

## 2022-04-30 DIAGNOSIS — Z8719 Personal history of other diseases of the digestive system: Secondary | ICD-10-CM

## 2022-04-30 DIAGNOSIS — J449 Chronic obstructive pulmonary disease, unspecified: Secondary | ICD-10-CM

## 2022-04-30 DIAGNOSIS — M26609 Unspecified temporomandibular joint disorder, unspecified side: Secondary | ICD-10-CM

## 2022-04-30 DIAGNOSIS — D3A8 Other benign neuroendocrine tumors: Secondary | ICD-10-CM

## 2022-04-30 DIAGNOSIS — C7A8 Other malignant neuroendocrine tumors: Secondary | ICD-10-CM

## 2022-04-30 DIAGNOSIS — I4891 Unspecified atrial fibrillation: Secondary | ICD-10-CM

## 2022-04-30 DIAGNOSIS — K289 Gastrojejunal ulcer, unspecified as acute or chronic, without hemorrhage or perforation: Secondary | ICD-10-CM

## 2022-04-30 DIAGNOSIS — E785 Hyperlipidemia, unspecified: Secondary | ICD-10-CM

## 2022-04-30 DIAGNOSIS — M1990 Unspecified osteoarthritis, unspecified site: Secondary | ICD-10-CM

## 2022-04-30 DIAGNOSIS — G4733 Obstructive sleep apnea (adult) (pediatric): Secondary | ICD-10-CM

## 2022-04-30 DIAGNOSIS — K219 Gastro-esophageal reflux disease without esophagitis: Secondary | ICD-10-CM

## 2022-04-30 LAB — COMPREHENSIVE METABOLIC PANEL
ALBUMIN: 4 g/dL (ref 3.5–5.0)
ALK PHOSPHATASE: 58 U/L (ref 25–110)
ALT: 13 U/L — ABNORMAL HIGH (ref 7–56)
ANION GAP: 8 K/UL (ref 3–12)
AST: 13 U/L (ref 7–40)
BLD UREA NITROGEN: 22 mg/dL (ref 7–25)
CALCIUM: 9 mg/dL (ref 8.5–10.6)
CHLORIDE: 106 MMOL/L (ref 98–110)
CO2: 27 MMOL/L (ref 21–30)
CREATININE: 1 mg/dL — ABNORMAL HIGH (ref 0.4–1.00)
EGFR: 59 mL/min — ABNORMAL LOW (ref >60)
GLUCOSE,PANEL: 100 mg/dL — ABNORMAL HIGH (ref 70–100)
POTASSIUM: 3.7 MMOL/L (ref 3.5–5.1)
SODIUM: 141 MMOL/L (ref 137–147)
TOTAL BILIRUBIN: 0.3 mg/dL (ref 0.3–1.2)
TOTAL PROTEIN: 6.8 g/dL (ref 6.0–8.0)

## 2022-04-30 LAB — CBC AND DIFF
HEMATOCRIT: 42 % (ref 36–45)
HEMOGLOBIN: 13 g/dL (ref 12.0–15.0)
RBC COUNT: 5 M/UL (ref 4.0–5.0)
WBC COUNT: 11 K/UL — ABNORMAL HIGH (ref 4.5–11.0)

## 2022-04-30 MED ORDER — RP DX COPPER CU-64 DOTATATE MCI
4 | Freq: Once | INTRAVENOUS | 0 refills | Status: CP
Start: 2022-04-30 — End: ?
  Administered 2022-04-30: 16:00:00 4.3 via INTRAVENOUS

## 2022-04-30 NOTE — Patient Instructions
Nurse: Kayti Aara Jacquot, BSN, RN  Phone: [phone]   Fax: [phone]  Available Monday - Friday 8:00 am - 4:00 pm    On-call number for urgent needs outside of business hours: [phone]  Ask for the on-call Oncology fellow to be paged and they will call you back.    Medication refills: Please contact your pharmacy for medication refills; if they do not have any refills on file, they will contact the office. Make sure they have our correct contact information on file.  P: [phone], F: [phone]    MyChart Messages: All MyChart messages are answered by the nurses, even if you select to send the message to Dr. Sun or Erin Carroll. Always select Dr. Sun when sending a message as that will route your message to me the fastest. Messages are answered 8:00 am - 4:00 pm Monday - Friday, holidays excluded.    Phone Calls: We hardly ever sit at our desks where our phone is located as we are in clinic most days during the week. Please leave us a message as we check our messages several times per day. It is our goal to answer these messages as soon as possible.  Messages are answered from 8:00am - 4:00 pm Monday - Friday, holidays excluded. Please make sure you leave your full name with the spelling, date of birth, reason for your call, and return phone number when leaving a message.

## 2022-04-30 NOTE — Progress Notes
Name: Shelby Rogers          MRN: [id number]      DOB: [birth year]      AGE: 71 y.o.   DATE OF SERVICE: 04/30/2022    Subjective:             Reason for Visit:  Follow Up    Shelby Rogers is a 71 y.o. female newly diagnosed with neuroendocrine cancer.     Cancer Staging   No matching staging information was found for the patient.    History of Present Illness  Shelby Rogers is a 71 y.o. 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 She is status post right robotic partial nephrectomy with negative margins 04/25/15. Surveillance CT scans were completed in 2017 which incidentally found a subcentimeter nodule on the head of the pancreas. She had an EUS 09/25/18 revealed a 7 mm round hypoechoic lesion. This was surrounded by blood vessels. FNA was completed which revealed well-differentiated neuroendocrine tumor. MRI 10/15/18 did not reveal a pancreatic lesion. She had a CT 08/23/19 that revealed a stable, small, arterial phase hyperenhancing nodule within the pancreatic head. The size was stable compared to previous imaging. An EUS was repeated 12/07/19 which revealed an 8 mm hypoechoic mass on the head of the pancreas. Due to the blood vessels around the lesion it was difficult to perform FNA. CT scan 01/25/20 did not identify a pancreatic lesion. She met with Dr.Kumer 02/03/20, whipple surgery was offered but she declined at this time. She is here today to establish care.    Interval changes 04/30/22  Has been following with Dr. Roselle Locus, transfer the care to [place] since Dr. Roselle Locus left [place].  Shelby Rogers presents today with her daughter for follow-up of neuroendocrine tumor. Overall, she has felt fairly well since her last appointment. Had an episodes of back pain that radiated to her abdomen in January that self-resolved. Intermittent abdominal bloating and cramping. She has EGD, CSY scheduled as she noted blood in her stool during April. No wheezing, shortness of breath, facial flushing, or diarrhea.       Review of Systems   Constitutional: Negative for activity change, appetite change, chills, fatigue, fever and unexpected weight change.   HENT: Negative for congestion, rhinorrhea and sinus pressure.    Eyes: Positive for photophobia. Negative for visual disturbance.   Respiratory: Negative for cough, shortness of breath and wheezing.    Cardiovascular: Negative for chest pain and palpitations.   Gastrointestinal: Positive for blood in stool, constipation and rectal pain. Negative for abdominal distention, abdominal pain, diarrhea and nausea.   Genitourinary: Negative for dysuria, frequency and urgency.   Musculoskeletal: Negative for arthralgias and myalgias.   Skin: Negative for color change and rash.   Allergic/Immunologic: Positive for food allergies.   Neurological: Negative for weakness and headaches.   Psychiatric/Behavioral: The patient is not nervous/anxious.          Objective:         ? albuterol sulfate (PROAIR HFA) 90 mcg/actuation HFA aerosol inhaler Inhale two puffs by mouth into the lungs every 6 hours as needed for Wheezing or Shortness of Breath. Shake well before use.   ? albuterol-ipratropium (DUONEB) 0.5 mg-3 mg(2.5 mg base)/3 mL nebulizer solution Inhale 3 mL solution by nebulizer as directed four times daily as needed for Wheezing.   ? dilTIAZem CD (CARDIZEM CD) 120 mg capsule Take one capsule by mouth at bedtime  daily.   ? dilTIAZem LA (CARDIZEM LA) 180 mg tablet one tablet daily. Indications: takes 180mg  in AM   ? doxycycline hyclate (VIBRACIN) 100 mg tablet    ? famotidine (PEPCID) 20 mg tablet Take one tablet by mouth twice daily.   ? fexofenadine (ALLEGRA) 180 mg tablet Take one tablet by mouth daily.   ? fish oil /omega-3 fatty acids (SEA-OMEGA) 340/1000 mg capsule Take one capsule by mouth daily.   ? flecainide (TAMBOCOR) 150 mg tablet TAKE ONE-HALF TABLET BY MOUTH TWICE DAILY.   ? fluticasone (FLONASE) 50 mcg/actuation nasal spray Apply two sprays to each nostril as directed daily.   ? levothyroxine (SYNTHROID) 25 mcg tablet Take one tablet by mouth every morning.   ? montelukast (SINGULAIR) 10 mg tablet Take one tablet by mouth every morning.   ? ondansetron HCL (ZOFRAN) 4 mg tablet    ? ondansetron HCL (ZOFRAN) 8 mg tablet Take one tablet by mouth every 8 hours as needed.   ? other medication one Dose. Multivitamin with 2000 units Vitamin D and 100mg  CoQ 10   ? pantoprazole DR (PROTONIX) 40 mg tablet Take one tablet by mouth twice daily. Starting 1 week prior to procedure and for 1 month post procedure.   ? potassium chloride SR (K-DUR) 20 mEq tablet Take one tablet by mouth daily. Take with a meal and a full glass of water.   ? senna (SENOKOT) 8.6 mg tablet Take one tablet by mouth daily.   ? tiotropium bromide (SPIRIVA RESPIMAT) 2.5 mcg/actuation inhaler Inhale two puffs by mouth into the lungs daily.   ? valsartan (DIOVAN) 80 mg tablet Take one tablet by mouth twice daily.   ? XARELTO 20 mg tablet TAKE ONE TABLET BY MOUTH DAILY WITH DINNER *TAKE WITH FOOD*   ? zolpidem (AMBIEN) 5 mg tablet Take one-half tablet by mouth nightly as needed.     Vitals:    04/30/22 1455   BP: 109/73   BP Source: Arm, Left Lower   Pulse: 66   Temp: 36.1 ?C (96.9 ?F)   SpO2: 100%   TempSrc: Temporal   PainSc: Zero   Weight: 95.6 kg (210 lb 12.8 oz)     Body mass index is 35.08 kg/m?Marland Kitchen     Pain Score: Zero       Fatigue Scale: 0-None    Pain Addressed:  N/A    Patient Evaluated for a Clinical Trial: No treatment clinical trial available for this patient.     [place] Cooperative Oncology Group performance status is 0, Fully active, able to carry on all pre-disease performance without restriction.Marland Kitchen     Physical Exam  Constitutional:       General: She is not in acute distress.     Appearance: She is not toxic-appearing.   Eyes:      General: No scleral icterus.     Conjunctiva/sclera: Conjunctivae normal.   Cardiovascular:      Rate and Rhythm: Normal rate and regular rhythm.      Heart sounds: Normal heart sounds. No murmur heard.     No gallop.   Pulmonary:      Effort: Pulmonary effort is normal. No respiratory distress.      Breath sounds: Normal breath sounds. No wheezing or rales.   Abdominal:      General: Bowel sounds are normal. There is no distension.      Palpations: Abdomen is soft.      Tenderness: There is no abdominal tenderness.  Musculoskeletal:      Right lower leg: No edema.      Left lower leg: No edema.   Skin:     Coloration: Skin is not jaundiced.      Findings: No rash.   Neurological:      General: No focal deficit present.      Mental Status: She is alert. Mental status is at baseline.   Psychiatric:         Mood and Affect: Mood normal.         Behavior: Behavior normal.         Thought Content: Thought content normal.         Judgment: Judgment normal.          CBC w diff    Lab Results   Component Value Date/Time    WBC 11.3 (H) 04/30/2022 11:17 AM    RBC 5.00 04/30/2022 11:17 AM    HGB 13.8 04/30/2022 11:17 AM    HCT 42.0 04/30/2022 11:17 AM    MCV 84.0 04/30/2022 11:17 AM    MCH 27.6 04/30/2022 11:17 AM    MCHC 32.8 04/30/2022 11:17 AM    RDW 24.2 (H) 04/30/2022 11:17 AM    PLTCT 224 04/30/2022 11:17 AM    MPV 8.3 04/30/2022 11:17 AM    Lab Results   Component Value Date/Time    NEUT 60 04/30/2022 11:17 AM    ANC 6.80 04/30/2022 11:17 AM    LYMA 26 04/30/2022 11:17 AM    ALC 3.00 04/30/2022 11:17 AM    MONA 12 04/30/2022 11:17 AM    AMC 1.30 (H) 04/30/2022 11:17 AM    EOSA 1 04/30/2022 11:17 AM    AEC 0.20 04/30/2022 11:17 AM    BASA 1 04/30/2022 11:17 AM    ABC 0.10 04/30/2022 11:17 AM        Comprehensive Metabolic Profile    Lab Results   Component Value Date/Time    NA 141 04/30/2022 11:17 AM    K 3.7 04/30/2022 11:17 AM    CL 106 04/30/2022 11:17 AM    CO2 27 04/30/2022 11:17 AM    GAP 8 04/30/2022 11:17 AM    BUN 22 04/30/2022 11:17 AM    CR 1.03 (H) 04/30/2022 11:17 AM    GLU 100 04/30/2022 11:17 AM    Lab Results   Component Value Date/Time    CA 9.0 04/30/2022 11:17 AM    ALBUMIN 4.0 04/30/2022 11:17 AM    TOTPROT 6.8 04/30/2022 11:17 AM    ALKPHOS 58 04/30/2022 11:17 AM    AST 13 04/30/2022 11:17 AM    ALT 13 04/30/2022 11:17 AM    TOTBILI 0.3 04/30/2022 11:17 AM    GFR >60 11/09/2020 02:21 PM    GFRAA >60 11/09/2020 02:21 PM             Assessment and Plan:  1- Well-differentiated neuroendocrine cancer  Surveillance CT scans were completed in 2017 which incidentally found a subcentimeter nodule on the head of the pancreas. She had an EUS 09/25/18 revealed a 7 mm round hypoechoic lesion. This was surrounded by blood vessels. FNA was completed which revealed well-differentiated neuroendocrine tumor. MRI 10/15/18 did not reveal a pancreatic lesion. She had a CT 08/23/19 that revealed a stable, small, arterial phase hyperenhancing nodule within the pancreatic head. The size was stable compared to previous imaging. An EUS was repeated 12/07/19 which revealed an 8 mm hypoechoic mass on the head of the pancreas.  Due to the blood vessels around the lesion it was difficult to perform FNA. CT scan 01/25/20 did not identify a pancreatic lesion. She met with Dr.Kumer 02/03/20, whipple surgery was offered but she declined at this time.     Since that time she has been undergoing interval surveillance imaging with NET PET every 6 months. Previously followed with Dr. Roselle Locus and established care with Dr. Wynelle Link on 04/20/22. Neuroendocrine PET 04/30/22 with no evidence of metastatic disease. She does not have any ongoing symptoms readily attributable to her underlying neuroendocrine tumor.    Plan:  - Will continue with Q6 month imaging surveillance for the time being. Will alternate MRI with NET PET.  - Chromogranin A level from today is pending. Repeat Chromogranin A, CBC, and CMP at next visit in 6 months.  - RTC in 6 months    Elise Benne  Hematology/Oncology Fellow PGY-6  [employer]         Attending Note:    I saw and  examined the patient with Dr. Elise Benne . I agree with the history, review of system, data,  and I am involved in and guided examination, and assessment/plan.     71 yr old female with Well-differentiated neuroendocrine cancer (pNET)  Has been following with Dr. Roselle Locus, transfer the care to [place] since Dr. Roselle Locus left [place].    Surveillance CT scans were completed in 2017 which incidentally found a subcentimeter nodule on the head of the pancreas. She had an EUS 09/25/18 revealed a 7 mm round hypoechoic lesion. This was surrounded by blood vessels. FNA was completed which revealed well-differentiated neuroendocrine tumor. MRI 10/15/18 did not reveal a pancreatic lesion. She had a CT 08/23/19 that revealed a stable, small, arterial phase hyperenhancing nodule within the pancreatic head. The size was stable compared to previous imaging. An EUS was repeated 12/07/19 which revealed an 8 mm hypoechoic mass on the head of the pancreas. Due to the blood vessels around the lesion it was difficult to perform FNA. CT scan 01/25/20 did not identify a pancreatic lesion. She met with Dr.Kumer 02/03/20, whipple surgery was offered but she declined at this time.     Since that time she has been undergoing interval surveillance imaging with NET PET every 6 months. Previously followed with Dr. Roselle Locus and established care with Dr. Wynelle Link on 04/30/22.     Neuroendocrine PET 04/30/22 with no evidence of metastatic disease. She does not have any ongoing symptoms readily attributable to her underlying neuroendocrine tumor.      - Will continue with Q6 month imaging surveillance for the time being. Will alternate MRI with NET PET.  - Chromogranin A level from today is pending. Repeat Chromogranin A, CBC, and CMP at next visit in 6 months.  - RTC in 6 months        The patient and family had a chance to ask questions, all been answered to their statisfaction.       Miguel Rota, MD, [facility]

## 2022-05-17 ENCOUNTER — Encounter: Admit: 2022-05-17 | Discharge: 2022-05-17 | Payer: MEDICARE

## 2022-05-17 MED ORDER — FLECAINIDE 150 MG PO TAB
75 mg | ORAL_TABLET | Freq: Two times a day (BID) | ORAL | 11 refills | 30.00000 days | Status: AC
Start: 2022-05-17 — End: ?

## 2022-05-31 IMAGING — CT ABDPELWO
2 of 3 series · 15 of 46 positions shown, 17 images · non-contrast
Comparison: 09/04/2018 CT abdomen and pelvis.

Procedure(s): CT abdomen pelvis wo con

CT ABDOMEN PELVIS WITHOUT CONTRAST
INDICATION: LOWER ABD PAIN, DIFFICULTY WITH URINATION
EXAM: Noncontrast CT of the abdomen and pelvis.  Coronal and
sagittal
reformatted images were performed. Automated exposure
control (AEC) dose
reduction technique was utilized.

[Series 2: aice body aice 3.0 · axial · 0.70mm/px · z∈[+1341,+1752]mm · 12 of 159 slices shown, 14 images]
[im 11/159  soft-tissue]
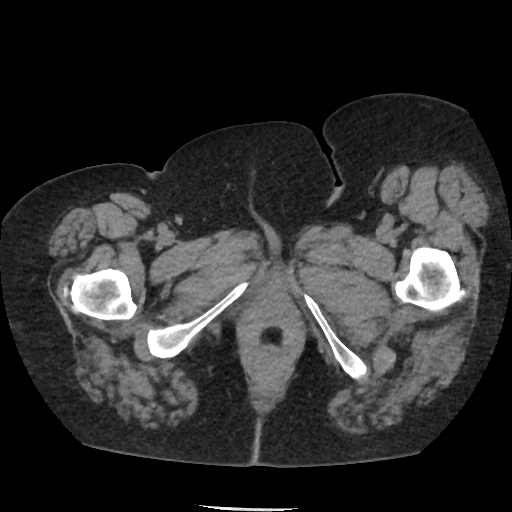
[im 11/159  bone]
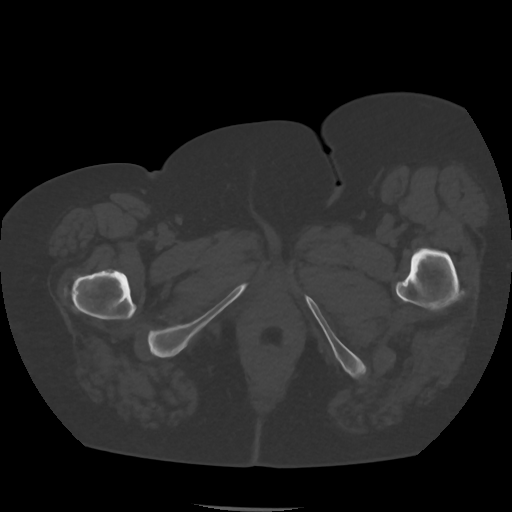
[im 21/159  soft-tissue]
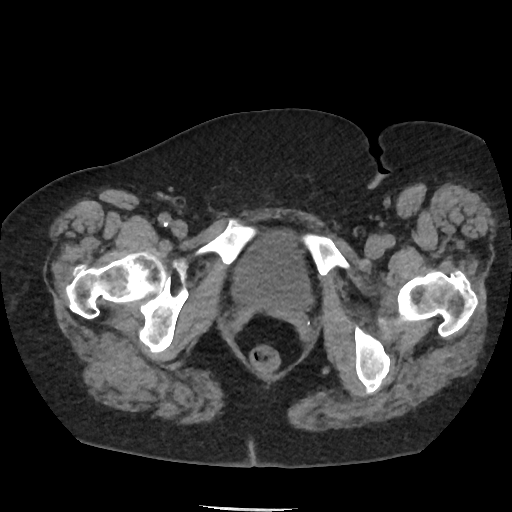
[im 36/159  soft-tissue]
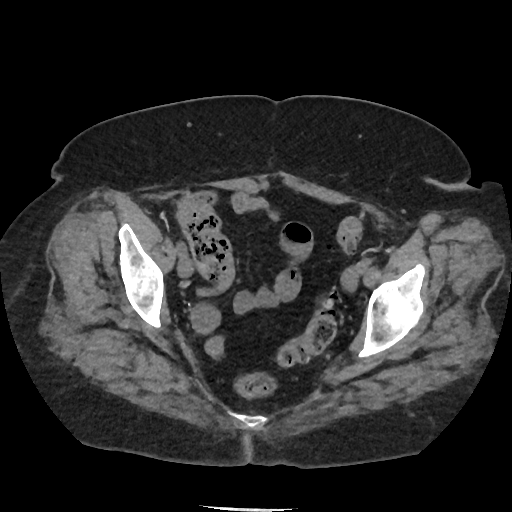
[im 46/159  soft-tissue]
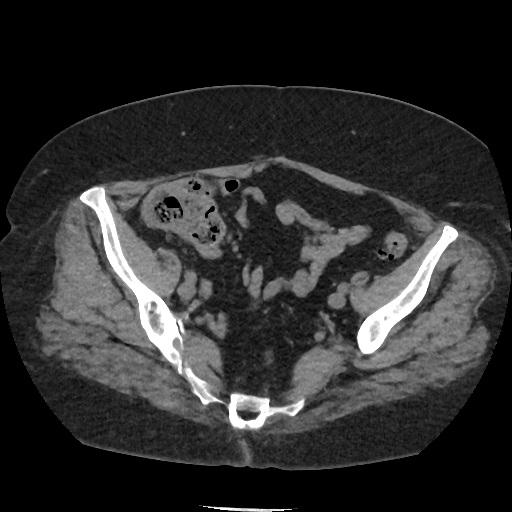
[im 62/159  soft-tissue]
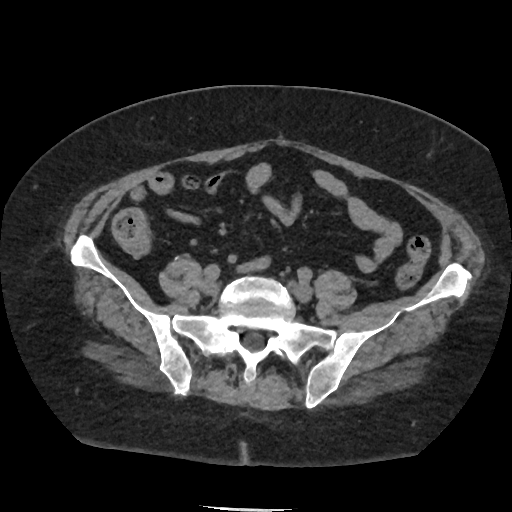
[im 72/159  soft-tissue]
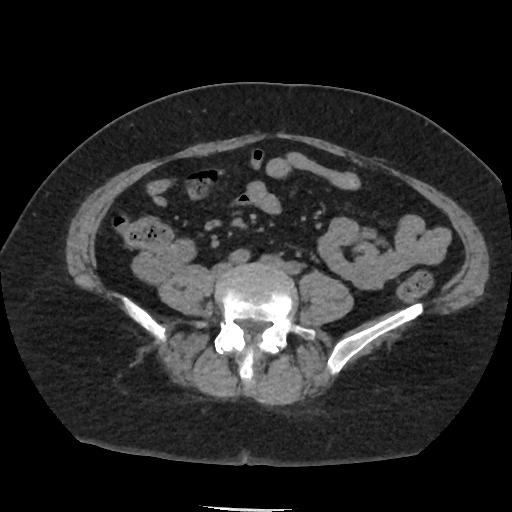
[im 87/159  soft-tissue]
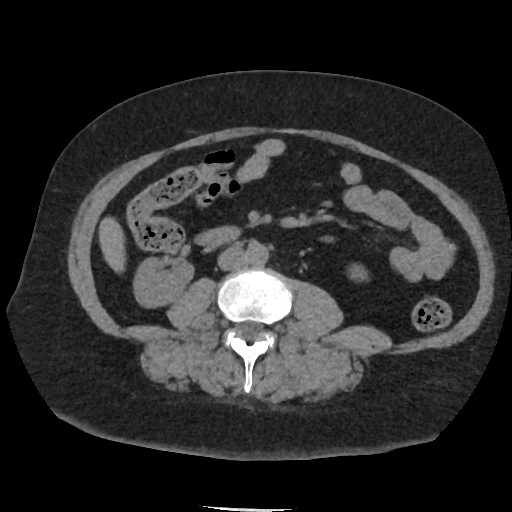
[im 97/159  soft-tissue]
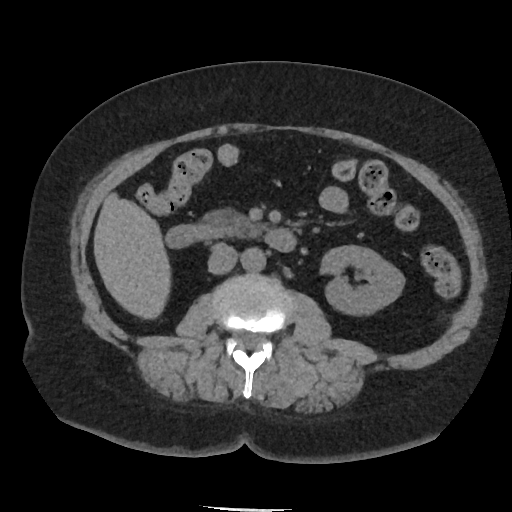
[im 113/159  soft-tissue]
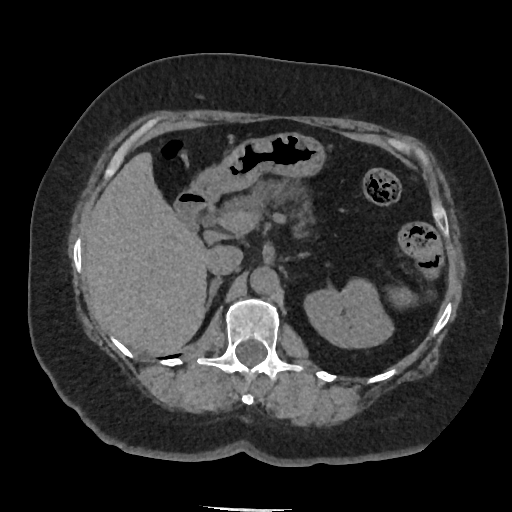
[im 113/159  bone]
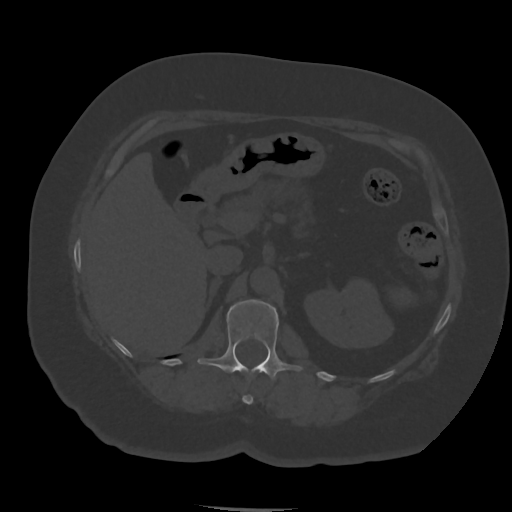
[im 123/159  soft-tissue]
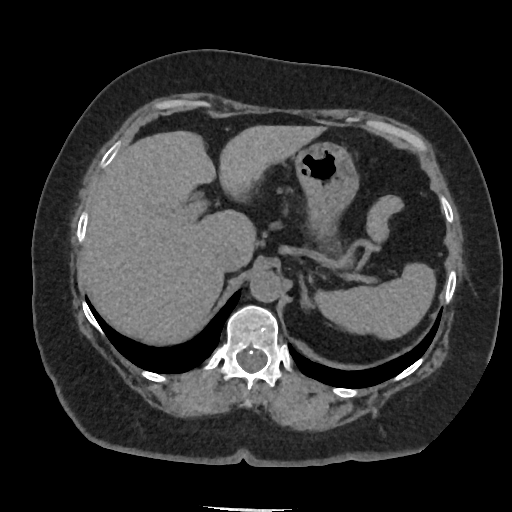
[im 138/159  soft-tissue]
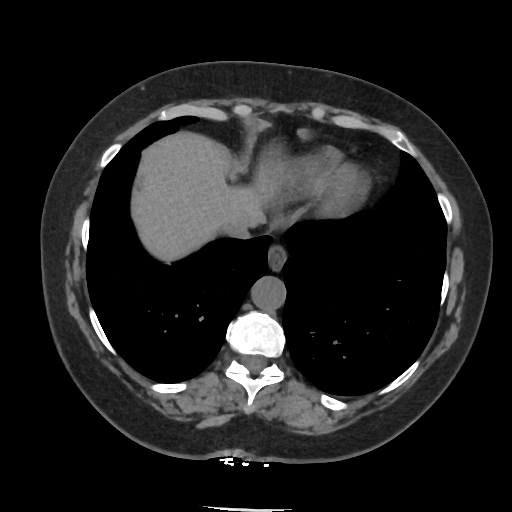
[im 148/159  soft-tissue]
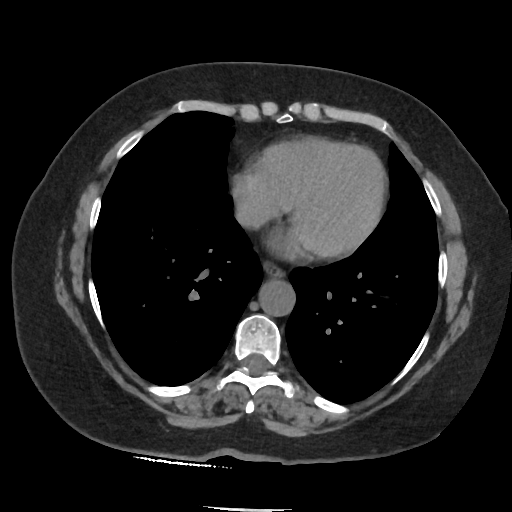

[Series 4: soft tissue aice 3.000 · coronal · 0.70mm/px · 3 of 99 slices shown]
[im 33/99  soft-tissue]
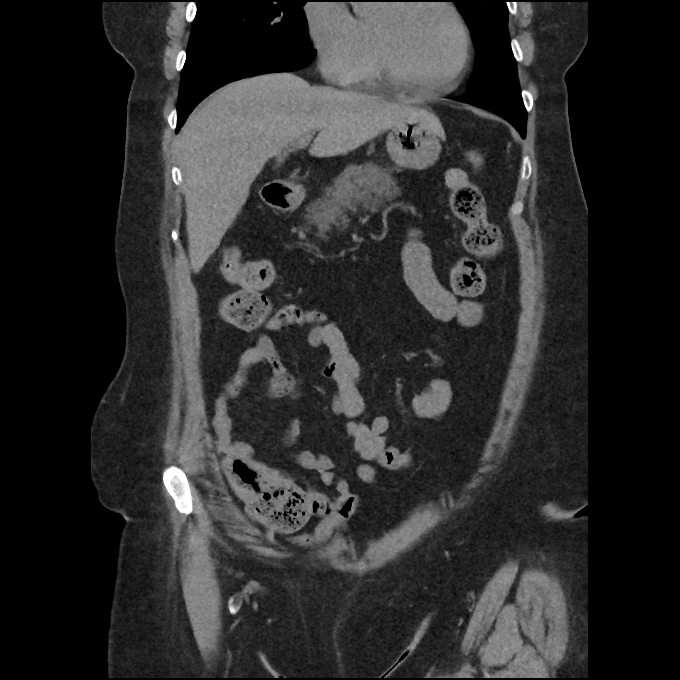
[im 44/99  soft-tissue]
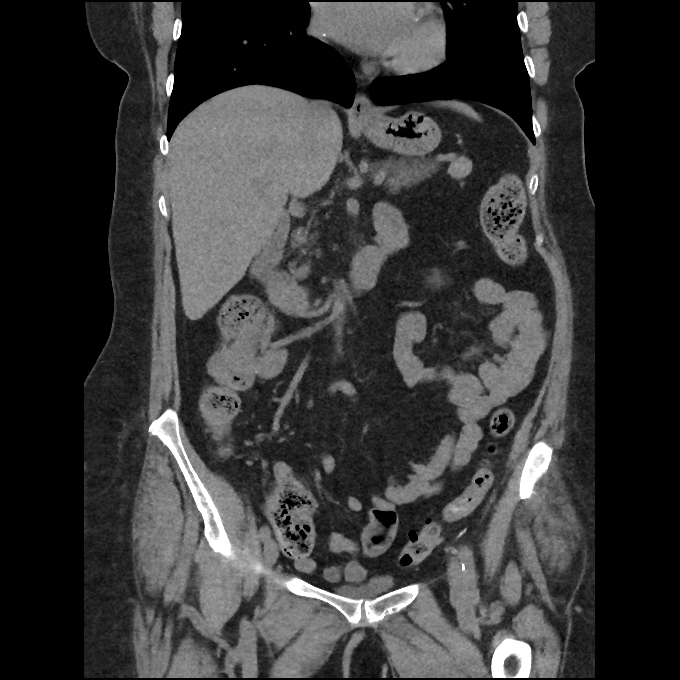
[im 55/99  soft-tissue]
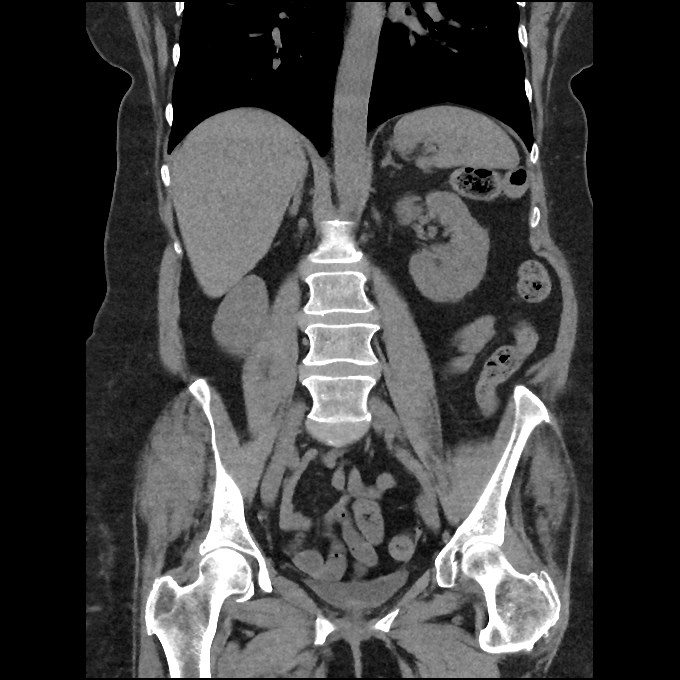

[15 of 46 positions shown; findings below may reference images not displayed]

FINDINGS: No free air, free fluid, or fluid collection.

Lower chest:  The visualized lower lungs are aerated. No
pleural or pericardial
effusion.

ABDOMEN:
Liver: The noncontrast liver is homogeneous in attenuation.

Gallbladder and biliary: Cholecystectomy. Normal caliber
bile ducts.

Spleen: Normal spleen.

Pancreas: The noncontrast pancreas is homogeneous in
attenuation without
peripancreatic inflammatory changes.

Adrenal glands: Normal adrenal glands.

Kidneys and ureters: Malrotation of the right kidney. No
opaque urinary calculi.
No hydronephrosis.

GI tract: The stomach is decompressed and poorly evaluated.
Normal caliber small
bowel and colon.

Vascular structures: Normal caliber abdominal aorta.

Lymph nodes/Peritoneum: No abdominal or pelvic
lymphadenopathy.

PELVIS:
Genitourinary system: Normal bladder.

SKELETAL STRUCTURES AND SOFT TISSUES: No acute displaced
fracture or destructive
osseous lesions in the visualized skeleton. Normal soft
tissues.
IMPRESSION: No acute process in the abdomen or pelvis.

Please note, evaluation for pathology involving the
abdominal and pelvic viscera
is limited without intravenous contrast.

## 2022-09-09 ENCOUNTER — Encounter: Admit: 2022-09-09 | Discharge: 2022-09-09 | Payer: MEDICARE

## 2022-09-30 ENCOUNTER — Encounter: Admit: 2022-09-30 | Discharge: 2022-09-30 | Payer: MEDICARE

## 2022-11-05 ENCOUNTER — Encounter: Admit: 2022-11-05 | Discharge: 2022-11-05 | Payer: MEDICARE

## 2022-11-12 ENCOUNTER — Encounter: Admit: 2022-11-12 | Discharge: 2022-11-12 | Payer: MEDICARE

## 2023-01-27 ENCOUNTER — Encounter: Admit: 2023-01-27 | Discharge: 2023-01-27 | Payer: MEDICARE

## 2023-02-06 ENCOUNTER — Encounter: Admit: 2023-02-06 | Discharge: 2023-02-06 | Payer: MEDICARE

## 2023-02-06 DIAGNOSIS — D3A8 Other benign neuroendocrine tumors: Secondary | ICD-10-CM

## 2023-02-11 ENCOUNTER — Encounter: Admit: 2023-02-11 | Discharge: 2023-02-11 | Payer: MEDICARE

## 2023-02-11 DIAGNOSIS — M26609 Unspecified temporomandibular joint disorder, unspecified side: Secondary | ICD-10-CM

## 2023-02-11 DIAGNOSIS — J45909 Unspecified asthma, uncomplicated: Secondary | ICD-10-CM

## 2023-02-11 DIAGNOSIS — J449 Chronic obstructive pulmonary disease, unspecified: Secondary | ICD-10-CM

## 2023-02-11 DIAGNOSIS — I513 Intracardiac thrombosis, not elsewhere classified: Secondary | ICD-10-CM

## 2023-02-11 DIAGNOSIS — K219 Gastro-esophageal reflux disease without esophagitis: Secondary | ICD-10-CM

## 2023-02-11 DIAGNOSIS — J309 Allergic rhinitis, unspecified: Secondary | ICD-10-CM

## 2023-02-11 DIAGNOSIS — G4733 Obstructive sleep apnea (adult) (pediatric): Secondary | ICD-10-CM

## 2023-02-11 DIAGNOSIS — C7A8 Other malignant neuroendocrine tumors: Secondary | ICD-10-CM

## 2023-02-11 DIAGNOSIS — R7303 Prediabetes: Secondary | ICD-10-CM

## 2023-02-11 DIAGNOSIS — E039 Hypothyroidism, unspecified: Secondary | ICD-10-CM

## 2023-02-11 DIAGNOSIS — I4891 Unspecified atrial fibrillation: Secondary | ICD-10-CM

## 2023-02-11 DIAGNOSIS — E785 Hyperlipidemia, unspecified: Secondary | ICD-10-CM

## 2023-02-11 DIAGNOSIS — H269 Unspecified cataract: Secondary | ICD-10-CM

## 2023-02-11 DIAGNOSIS — D3A8 Other benign neuroendocrine tumors: Secondary | ICD-10-CM

## 2023-02-11 DIAGNOSIS — K289 Gastrojejunal ulcer, unspecified as acute or chronic, without hemorrhage or perforation: Secondary | ICD-10-CM

## 2023-02-11 DIAGNOSIS — M1990 Unspecified osteoarthritis, unspecified site: Secondary | ICD-10-CM

## 2023-02-11 DIAGNOSIS — R079 Chest pain, unspecified: Secondary | ICD-10-CM

## 2023-02-11 DIAGNOSIS — M797 Fibromyalgia: Secondary | ICD-10-CM

## 2023-02-11 DIAGNOSIS — Z8719 Personal history of other diseases of the digestive system: Secondary | ICD-10-CM

## 2023-02-11 DIAGNOSIS — I10 Essential (primary) hypertension: Secondary | ICD-10-CM

## 2023-02-11 DIAGNOSIS — N186 End stage renal disease: Secondary | ICD-10-CM

## 2023-02-11 DIAGNOSIS — K8689 Other specified diseases of pancreas: Secondary | ICD-10-CM

## 2023-02-11 LAB — CBC AND DIFF
ABSOLUTE BASO COUNT: 0.1 K/UL (ref 0–0.20)
ABSOLUTE EOS COUNT: 0.6 K/UL — ABNORMAL HIGH (ref 0–0.45)
ABSOLUTE LYMPH COUNT: 2.8 K/UL (ref 1.0–4.8)
ABSOLUTE MONO COUNT: 1.1 K/UL — ABNORMAL HIGH (ref 0–0.80)
ABSOLUTE NEUTROPHIL: 3.6 K/UL (ref 1.8–7.0)
BASOPHILS %: 1 % (ref 0–2)
EOSINOPHILS %: 7 % — ABNORMAL HIGH (ref >60)
HEMATOCRIT: 44 % — ABNORMAL HIGH (ref 36–45)
HEMOGLOBIN: 14 g/dL (ref 12.0–15.0)
LYMPHOCYTES %: 34 % (ref 24–44)
MCH: 28 pg (ref >60)
MCHC: 32 g/dL (ref 32.0–36.0)
MCV: 86 FL (ref 80–100)
MONOCYTES %: 13 % — ABNORMAL HIGH (ref 4–12)
MPV: 8.9 FL (ref 7–11)
NEUTROPHILS %: 45 % (ref 41–77)
PLATELET COUNT: 218 K/UL (ref 150–400)
RBC COUNT: 5.1 M/UL — ABNORMAL HIGH (ref 4.0–5.0)
RDW: 14 % (ref 11–15)
WBC COUNT: 8.1 K/UL (ref 4.5–11.0)

## 2023-02-11 LAB — COMPREHENSIVE METABOLIC PANEL
POTASSIUM: 4.2 MMOL/L (ref 3.5–5.1)
SODIUM: 141 MMOL/L (ref 137–147)

## 2023-02-11 MED ORDER — GADOBENATE DIMEGLUMINE 529 MG/ML (0.1MMOL/0.2ML) IV SOLN
20 mL | Freq: Once | INTRAVENOUS | 0 refills | Status: CP
Start: 2023-02-11 — End: ?
  Administered 2023-02-11: 14:00:00 20 mL via INTRAVENOUS

## 2023-02-11 MED ORDER — SODIUM CHLORIDE 0.9 % IJ SOLN
50 mL | Freq: Once | INTRAVENOUS | 0 refills | Status: CP
Start: 2023-02-11 — End: ?
  Administered 2023-02-11: 14:00:00 50 mL via INTRAVENOUS

## 2023-02-11 NOTE — Progress Notes
Name: Shelby Rogers          MRN: [id number]      DOB: [birth year]      AGE: 72 y.o.   DATE OF SERVICE: 02/11/2023    Subjective:             Reason for Visit:  Follow Up    Shelby Rogers is a 72 y.o. female newly diagnosed with neuroendocrine cancer.     Cancer Staging   No matching staging information was found for the patient.      History of Present Illness  Shelby Rogers is a 72 y.o. 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 She is status post right robotic partial nephrectomy with negative margins 04/25/15. Surveillance CT scans were completed in 2017 which incidentally found a subcentimeter nodule on the head of the pancreas. She had an EUS 09/25/18 revealed a 7 mm round hypoechoic lesion. This was surrounded by blood vessels. FNA was completed which revealed well-differentiated neuroendocrine tumor. MRI 10/15/18 did not reveal a pancreatic lesion. She had a CT 08/23/19 that revealed a stable, small, arterial phase hyperenhancing nodule within the pancreatic head. The size was stable compared to previous imaging. An EUS was repeated 12/07/19 which revealed an 8 mm hypoechoic mass on the head of the pancreas. Due to the blood vessels around the lesion it was difficult to perform FNA. CT scan 01/25/20 did not identify a pancreatic lesion. She met with Shelby Rogers 02/03/20, whipple surgery was offered but she declined at this time. She is here today to establish care.      Interval History  02/11/23  Has been following with Shelby Rogers, transfer the care to [place] since Shelby Rogers left [place].  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   HENT:  Negative for congestion, rhinorrhea and sinus pressure.    Eyes:  Positive for photophobia. Negative for visual disturbance.   Respiratory:  Negative for cough, shortness of breath and wheezing.    Cardiovascular:  Negative for chest pain and palpitations.   Gastrointestinal:  Positive for blood in stool, constipation and rectal pain. Negative for abdominal distention, abdominal pain, diarrhea and nausea.   Genitourinary:  Negative for dysuria, frequency and urgency.   Musculoskeletal:  Negative for arthralgias and myalgias.   Skin:  Negative for color change and rash.   Allergic/Immunologic: Positive for food allergies.   Neurological:  Negative for weakness and headaches.   Psychiatric/Behavioral:  The patient is not nervous/anxious.          Objective:          albuterol sulfate (PROAIR HFA) 90 mcg/actuation HFA aerosol inhaler Inhale two puffs by mouth into the lungs every 6 hours as needed for Wheezing or Shortness of Breath. Shake well before use.    albuterol-ipratropium (DUONEB) 0.5 mg-3 mg(2.5 mg base)/3 mL nebulizer solution Inhale 3 mL solution by nebulizer as directed four times daily as needed for Wheezing.    dilTIAZem CD (CARDIZEM CD) 120 mg capsule Take one capsule by mouth at bedtime daily.    dilTIAZem LA (CARDIZEM  LA) 180 mg tablet one tablet daily. Indications: takes 180mg  in AM    doxycycline hyclate (VIBRACIN) 100 mg tablet     famotidine (PEPCID) 20 mg tablet Take one tablet by mouth twice daily.    fexofenadine (ALLEGRA) 180 mg tablet Take one tablet by mouth daily.    fish oil /omega-3 fatty acids (SEA-OMEGA) 340/1000 mg capsule Take one capsule by mouth daily.    flecainide (TAMBOCOR) 150 mg tablet Take one-half tablet by mouth twice daily.    fluticasone (FLONASE) 50 mcg/actuation nasal spray Apply two sprays to each nostril as directed daily.    levothyroxine (SYNTHROID) 25 mcg tablet Take one tablet by mouth every morning.    montelukast (SINGULAIR) 10 mg tablet Take one tablet by mouth every morning.    ondansetron HCL (ZOFRAN) 4 mg tablet     ondansetron HCL (ZOFRAN) 8 mg tablet Take one tablet by mouth every 8 hours as needed.    other medication one Dose. Multivitamin with 2000 units Vitamin D and 100mg  CoQ 10    pantoprazole DR (PROTONIX) 40 mg tablet Take one tablet by mouth twice daily. Starting 1 week prior to procedure and for 1 month post procedure.    potassium chloride SR (K-DUR) 20 mEq tablet Take one tablet by mouth daily. Take with a meal and a full glass of water.    senna (SENOKOT) 8.6 mg tablet Take one tablet by mouth daily.    tiotropium bromide (SPIRIVA RESPIMAT) 2.5 mcg/actuation inhaler Inhale two puffs by mouth into the lungs daily.    valsartan (DIOVAN) 80 mg tablet Take one tablet by mouth twice daily.    XARELTO 20 mg tablet TAKE ONE TABLET BY MOUTH DAILY WITH DINNER *TAKE WITH FOOD*    zolpidem (AMBIEN) 5 mg tablet Take one-half tablet by mouth nightly as needed.     Vitals:    02/11/23 1005   BP: 139/67   BP Source: Arm, Left Upper   Pulse: 68   Temp: 36.1 ?C (96.9 ?F)   Resp: 18   SpO2: 100%   TempSrc: Temporal   PainSc: Zero   Weight: 94.2 kg (207 lb 9.6 oz)     Body mass index is 34.55 kg/m?Marland Kitchen     Pain Score: Zero       Fatigue Scale: 0-None    Pain Addressed:  N/A    Patient Evaluated for a Clinical Trial: No treatment clinical trial available for this patient.     [place] Cooperative Oncology Group performance status is 0, Fully active, able to carry on all pre-disease performance without restriction.Marland Kitchen     Physical Exam  Constitutional:       General: She is not in acute distress.     Appearance: She is not toxic-appearing.   Eyes:      General: No scleral icterus.     Conjunctiva/sclera: Conjunctivae normal.   Cardiovascular:      Rate and Rhythm: Normal rate and regular rhythm.      Heart sounds: Normal heart sounds. No murmur heard.     No gallop.   Pulmonary:      Effort: Pulmonary effort is normal. No respiratory distress. Breath sounds: Normal breath sounds. No wheezing or rales.   Abdominal:      General: Bowel sounds are normal. There is no distension.      Palpations: Abdomen is soft.      Tenderness: There is no abdominal tenderness.   Musculoskeletal:      Right lower  leg: No edema.      Left lower leg: No edema.   Skin:     Coloration: Skin is not jaundiced.      Findings: No rash.   Neurological:      General: No focal deficit present.      Mental Status: She is alert. Mental status is at baseline.   Psychiatric:         Mood and Affect: Mood normal.         Behavior: Behavior normal.         Thought Content: Thought content normal.         Judgment: Judgment normal.        CBC w diff    Lab Results   Component Value Date/Time    WBC 8.1 02/11/2023 08:05 AM    RBC 5.14 (H) 02/11/2023 08:05 AM    HGB 14.4 02/11/2023 08:05 AM    HCT 44.6 02/11/2023 08:05 AM    MCV 86.7 02/11/2023 08:05 AM    MCH 28.0 02/11/2023 08:05 AM    MCHC 32.3 02/11/2023 08:05 AM    RDW 14.9 02/11/2023 08:05 AM    PLTCT 218 02/11/2023 08:05 AM    MPV 8.9 02/11/2023 08:05 AM    Lab Results   Component Value Date/Time    NEUT 45 02/11/2023 08:05 AM    ANC 3.60 02/11/2023 08:05 AM    LYMA 34 02/11/2023 08:05 AM    ALC 2.80 02/11/2023 08:05 AM    MONA 13 (H) 02/11/2023 08:05 AM    AMC 1.10 (H) 02/11/2023 08:05 AM    EOSA 7 (H) 02/11/2023 08:05 AM    AEC 0.60 (H) 02/11/2023 08:05 AM    BASA 1 02/11/2023 08:05 AM    ABC 0.10 02/11/2023 08:05 AM        Comprehensive Metabolic Profile    Lab Results   Component Value Date/Time    NA 141 02/11/2023 08:05 AM    K 4.2 02/11/2023 08:05 AM    CL 105 02/11/2023 08:05 AM    CO2 29 02/11/2023 08:05 AM    GAP 7 02/11/2023 08:05 AM    BUN 22 02/11/2023 08:05 AM    CR 1.03 (H) 02/11/2023 08:05 AM    GLU 97 02/11/2023 08:05 AM    Lab Results   Component Value Date/Time    CA 9.6 02/11/2023 08:05 AM    ALBUMIN 4.0 02/11/2023 08:05 AM    TOTPROT 6.7 02/11/2023 08:05 AM    ALKPHOS 69 02/11/2023 08:05 AM    AST 13 02/11/2023 08:05 AM    ALT 10 02/11/2023 08:05 AM    TOTBILI 0.4 02/11/2023 08:05 AM    GFR >60 11/09/2020 02:21 PM    GFRAA >60 11/09/2020 02:21 PM        MRI 02/11/2023    1.  Small arterially enhancing lesion in the pancreatic head corresponding   to the known tracer avid pancreatic neuroendocrine carcinoma     2.  No evidence of upper abdominal metastatic disease.     3.  Partial right lower pole nephrectomy. No discrete enhancing renal mass.      Assessment and Plan:      72  yr old female with Well-differentiated neuroendocrine cancer (pNET)  Has been following with Shelby Rogers, transfer the care to [place] since Shelby Rogers left [place].    Surveillance CT scans were completed in 2017 which incidentally found a subcentimeter nodule on the head of the pancreas. She had  an EUS 09/25/18 revealed a 7 mm round hypoechoic lesion. This was surrounded by blood vessels. FNA was completed which revealed well-differentiated neuroendocrine tumor. MRI 10/15/18 did not reveal a pancreatic lesion. She had a CT 08/23/19 that revealed a stable, small, arterial phase hyperenhancing nodule within the pancreatic head. The size was stable compared to previous imaging. An EUS was repeated 12/07/19 which revealed an 8 mm hypoechoic mass on the head of the pancreas. Due to the blood vessels around the lesion it was difficult to perform FNA. CT scan 01/25/20 did not identify a pancreatic lesion. She met with Shelby Rogers 02/03/20, whipple surgery was offered but she declined at this time.     Since that time she has been undergoing interval surveillance imaging with NET PET every 6 months. Previously followed with Shelby Rogers and established care with Dr. Wynelle Link on 04/30/22.     Neuroendocrine PET 04/30/22 with no evidence of metastatic disease. She does not have any ongoing symptoms readily attributable to her underlying neuroendocrine tumor.    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  - RTC in 6 months        The patient and family had a chance to ask questions, all been answered to their statisfaction.       Miguel Rota, MD, [facility]

## 2023-02-11 NOTE — Patient Instructions
Dr. Weijing Sun and Erin Carroll, APRN-NP  Nurse: Kayti Miran Kautzman, BSN, RN  Phone: [phone]   Fax: [phone]  Available Monday - Friday 8:00 am - 4:00 pm    SCHEDULING NEEDS: [phone]    On-call number for urgent needs outside of business hours: [phone]  Ask for the on-call Oncology fellow to be paged and they will call you back.    Medication refills: Please contact your pharmacy FIRST for medication refills; if they do not have any refills on file, they will contact the office. Make sure they have our correct contact information on file.  P: [phone], F: [phone]    MyChart Messages: All messages come to the nurse and we discuss with Dr. Sun and Erin Carroll, APRN. Please make sure to select Dr. Sun or Erin when sending a message. Messages are answered 8:00 am - 4:00 pm Monday - Friday, holidays excluded. Please keep in mind we have 4 hours to respond to your messages.    Phone Calls: Our phone number is strictly a nurse voicemail. We check these messages all day and will get back with you. Please state your full name, date of birth, and reason for your call with as much detail as possible. Please keep in mind we have 4 hours to return your call.    FMLA/Insurance/Disability paperwork: Please fill out as much of the paperwork as you are able. We request that you give us 7-10 business days to complete this as we have a high volume of paperwork to complete for our patients.

## 2023-08-08 ENCOUNTER — Encounter: Admit: 2023-08-08 | Discharge: 2023-08-08 | Payer: MEDICARE

## 2023-08-10 ENCOUNTER — Encounter: Admit: 2023-08-10 | Discharge: 2023-08-10 | Payer: MEDICARE

## 2023-08-10 MED ORDER — JARDIANCE 10 MG PO TAB
ORAL_TABLET | 3 refills
Start: 2023-08-10 — End: ?

## 2023-08-10 MED ORDER — METOPROLOL SUCCINATE 25 MG PO TB24
ORAL_TABLET | 3 refills
Start: 2023-08-10 — End: ?

## 2023-08-11 ENCOUNTER — Encounter: Admit: 2023-08-11 | Discharge: 2023-08-11 | Payer: MEDICARE

## 2023-08-13 ENCOUNTER — Encounter: Admit: 2023-08-13 | Discharge: 2023-08-13 | Payer: MEDICARE

## 2023-08-13 DIAGNOSIS — D3A8 Other benign neuroendocrine tumors: Secondary | ICD-10-CM

## 2023-08-18 ENCOUNTER — Encounter: Admit: 2023-08-18 | Discharge: 2023-08-18 | Payer: MEDICARE

## 2023-08-18 MED ORDER — METOPROLOL SUCCINATE 25 MG PO TB24
ORAL_TABLET | 3 refills
Start: 2023-08-18 — End: ?

## 2023-08-18 MED ORDER — JARDIANCE 10 MG PO TAB
ORAL_TABLET | 3 refills
Start: 2023-08-18 — End: ?

## 2023-08-19 ENCOUNTER — Encounter: Admit: 2023-08-19 | Discharge: 2023-08-19 | Payer: MEDICARE

## 2023-08-19 ENCOUNTER — Ambulatory Visit: Admit: 2023-08-19 | Discharge: 2023-08-19 | Payer: MEDICARE

## 2023-08-19 DIAGNOSIS — R079 Chest pain, unspecified: Secondary | ICD-10-CM

## 2023-08-19 DIAGNOSIS — H269 Unspecified cataract: Secondary | ICD-10-CM

## 2023-08-19 DIAGNOSIS — K8689 Other specified diseases of pancreas: Secondary | ICD-10-CM

## 2023-08-19 DIAGNOSIS — M797 Fibromyalgia: Secondary | ICD-10-CM

## 2023-08-19 DIAGNOSIS — I10 Essential (primary) hypertension: Secondary | ICD-10-CM

## 2023-08-19 DIAGNOSIS — J309 Allergic rhinitis, unspecified: Secondary | ICD-10-CM

## 2023-08-19 DIAGNOSIS — I4891 Unspecified atrial fibrillation: Secondary | ICD-10-CM

## 2023-08-19 DIAGNOSIS — K289 Gastrojejunal ulcer, unspecified as acute or chronic, without hemorrhage or perforation: Secondary | ICD-10-CM

## 2023-08-19 DIAGNOSIS — R7303 Prediabetes: Secondary | ICD-10-CM

## 2023-08-19 DIAGNOSIS — Z8719 Personal history of other diseases of the digestive system: Secondary | ICD-10-CM

## 2023-08-19 DIAGNOSIS — I513 Intracardiac thrombosis, not elsewhere classified: Secondary | ICD-10-CM

## 2023-08-19 DIAGNOSIS — C7A8 Other malignant neuroendocrine tumors: Secondary | ICD-10-CM

## 2023-08-19 DIAGNOSIS — D3A8 Other benign neuroendocrine tumors: Secondary | ICD-10-CM

## 2023-08-19 DIAGNOSIS — E039 Hypothyroidism, unspecified: Secondary | ICD-10-CM

## 2023-08-19 DIAGNOSIS — M1990 Unspecified osteoarthritis, unspecified site: Secondary | ICD-10-CM

## 2023-08-19 DIAGNOSIS — M26609 Unspecified temporomandibular joint disorder, unspecified side: Secondary | ICD-10-CM

## 2023-08-19 DIAGNOSIS — J449 Chronic obstructive pulmonary disease, unspecified: Secondary | ICD-10-CM

## 2023-08-19 DIAGNOSIS — G4733 Obstructive sleep apnea (adult) (pediatric): Secondary | ICD-10-CM

## 2023-08-19 DIAGNOSIS — J45909 Unspecified asthma, uncomplicated: Secondary | ICD-10-CM

## 2023-08-19 DIAGNOSIS — K219 Gastro-esophageal reflux disease without esophagitis: Secondary | ICD-10-CM

## 2023-08-19 DIAGNOSIS — N186 End stage renal disease: Secondary | ICD-10-CM

## 2023-08-19 DIAGNOSIS — E785 Hyperlipidemia, unspecified: Secondary | ICD-10-CM

## 2023-08-19 LAB — COMPREHENSIVE METABOLIC PANEL
ALBUMIN: 3.7 g/dL — ABNORMAL HIGH (ref 3.5–5.0)
ALK PHOSPHATASE: 60 U/L (ref 25–110)
ALT: 13 U/L — ABNORMAL HIGH (ref 7–56)
ANION GAP: 6 10*3/uL — ABNORMAL HIGH (ref 3–12)
AST: 13 U/L (ref 7–40)
BLD UREA NITROGEN: 19 mg/dL (ref 7–25)
CALCIUM: 8.5 mg/dL (ref 8.5–10.6)
CHLORIDE: 111 MMOL/L — ABNORMAL HIGH (ref 98–110)
CO2: 25 MMOL/L (ref 21–30)
CREATININE: 0.9 mg/dL (ref 0.4–1.00)
EGFR: >60 mL/min (ref >60)
GLUCOSE,PANEL: 88 mg/dL — ABNORMAL HIGH (ref 70–100)
POTASSIUM: 3.8 MMOL/L (ref 3.5–5.1)
SODIUM: 142 MMOL/L (ref 137–147)
TOTAL BILIRUBIN: 0.6 mg/dL (ref 0.2–1.3)
TOTAL PROTEIN: 5.8 g/dL — ABNORMAL LOW (ref 6.0–8.0)

## 2023-08-19 LAB — CBC AND DIFF
HEMATOCRIT: 42 % (ref 36–45)
HEMOGLOBIN: 13 g/dL (ref 12.0–15.0)
RBC COUNT: 5 M/UL — ABNORMAL HIGH (ref 4.0–5.0)
WBC COUNT: 7.4 10*3/uL (ref 4.5–11.0)

## 2023-08-19 MED ORDER — RP DX COPPER CU-64 DOTATATE MCI
4 | Freq: Once | INTRAVENOUS | 0 refills | Status: CP
Start: 2023-08-19 — End: ?
  Administered 2023-08-19: 16:00:00 4.37 via INTRAVENOUS

## 2023-08-19 MED ORDER — METOPROLOL SUCCINATE 25 MG PO TB24
ORAL_TABLET | 3 refills
Start: 2023-08-19 — End: ?

## 2023-08-19 MED ORDER — JARDIANCE 10 MG PO TAB
ORAL_TABLET | 3 refills
Start: 2023-08-19 — End: ?

## 2023-08-19 NOTE — Progress Notes
Name: Shelby Rogers          MRN: [id number]      DOB: 1951-08-14      AGE: 72 y.o.   DATE OF SERVICE: 08/19/2023    Subjective:             Reason for Visit:  Heme/Onc Care    Shelby Rogers is a 72 y.o. female newly diagnosed with neuroendocrine cancer.     Cancer Staging   No matching staging information was found for the patient.      History of Present Illness  Shelby Rogers is a 72 y.o. 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 She is status post right robotic partial nephrectomy with negative margins 04/25/15. Surveillance CT scans were completed in 2017 which incidentally found a subcentimeter nodule on the head of the pancreas. She had an EUS 09/25/18 revealed a 7 mm round hypoechoic lesion. This was surrounded by blood vessels. FNA was completed which revealed well-differentiated neuroendocrine tumor. MRI 10/15/18 did not reveal a pancreatic lesion. She had a CT 08/23/19 that revealed a stable, small, arterial phase hyperenhancing nodule within the pancreatic head. The size was stable compared to previous imaging. An EUS was repeated 12/07/19 which revealed an 8 mm hypoechoic mass on the head of the pancreas. Due to the blood vessels around the lesion it was difficult to perform FNA. CT scan 01/25/20 did not identify a pancreatic lesion. She met with Dr.Kumer 02/03/20, whipple surgery was offered but she declined at this time. She is here today to establish care.      Had been following with Dr. Roselle Locus, transfer the care to [place] since Dr. Roselle Locus left [place].    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   HENT:  Negative for congestion, rhinorrhea and sinus pressure.    Eyes:  Positive for photophobia. Negative for visual disturbance.   Respiratory:  Negative for cough, shortness of breath and wheezing.    Cardiovascular:  Negative for chest pain and palpitations.   Gastrointestinal:  Positive for blood in stool, constipation and rectal pain. Negative for abdominal distention, abdominal pain, diarrhea and nausea.   Genitourinary:  Negative for dysuria, frequency and urgency.   Musculoskeletal:  Negative for arthralgias and myalgias.   Skin:  Negative for color change and rash.   Allergic/Immunologic: Positive for food allergies.   Neurological:  Negative for weakness and headaches.   Psychiatric/Behavioral:  The patient is not nervous/anxious.          Objective:          albuterol sulfate (PROAIR HFA) 90 mcg/actuation HFA aerosol inhaler Inhale two puffs by mouth into the lungs every 6 hours as needed for Wheezing or Shortness of Breath. Shake well before use.    albuterol-ipratropium (DUONEB) 0.5 mg-3 mg(2.5 mg base)/3 mL nebulizer solution Inhale 3 mL solution by nebulizer as directed four times daily as needed for Wheezing.    dilTIAZem CD (CARDIZEM CD) 120 mg capsule Take one capsule by mouth at bedtime daily.    dilTIAZem LA (CARDIZEM LA) 180 mg tablet one tablet daily.  Indications: takes 180mg  in AM    doxycycline hyclate (VIBRACIN) 100 mg tablet     famotidine (PEPCID) 20 mg tablet Take one tablet by mouth twice daily.    fexofenadine (ALLEGRA) 180 mg tablet Take one tablet by mouth daily.    fish oil /omega-3 fatty acids (SEA-OMEGA) 340/1000 mg capsule Take one capsule by mouth daily.    flecainide (TAMBOCOR) 150 mg tablet Take one-half tablet by mouth twice daily.    fluticasone (FLONASE) 50 mcg/actuation nasal spray Apply two sprays to each nostril as directed daily.    levothyroxine (SYNTHROID) 25 mcg tablet Take one tablet by mouth every morning.    montelukast (SINGULAIR) 10 mg tablet Take one tablet by mouth every morning. ondansetron HCL (ZOFRAN) 4 mg tablet     ondansetron HCL (ZOFRAN) 8 mg tablet Take one tablet by mouth every 8 hours as needed.    other medication one Dose. Multivitamin with 2000 units Vitamin D and 100mg  CoQ 10    pantoprazole DR (PROTONIX) 40 mg tablet Take one tablet by mouth twice daily. Starting 1 week prior to procedure and for 1 month post procedure.    potassium chloride SR (K-DUR) 20 mEq tablet Take one tablet by mouth daily. Take with a meal and a full glass of water.    senna (SENOKOT) 8.6 mg tablet Take one tablet by mouth daily.    tiotropium bromide (SPIRIVA RESPIMAT) 2.5 mcg/actuation inhaler Inhale two puffs by mouth into the lungs daily.    valsartan (DIOVAN) 80 mg tablet Take one tablet by mouth twice daily.    XARELTO 20 mg tablet TAKE ONE TABLET BY MOUTH DAILY WITH DINNER *TAKE WITH FOOD*    zolpidem (AMBIEN) 5 mg tablet Take one-half tablet by mouth nightly as needed.     Vitals:    08/19/23 1305   BP: (!) 149/82   BP Source: Arm, Left Lower   Pulse: 67   Temp: (!) 35.9 ?C (96.7 ?F)   Resp: 16   SpO2: 100%   TempSrc: Temporal   PainSc: Eight   Weight: 96.7 kg (213 lb 3.2 oz)     Body mass index is 35.48 kg/m?Marland Kitchen     Pain Score: Eight  Pain Loc: Abdomen         Pain Addressed:  N/A    Patient Evaluated for a Clinical Trial: No treatment clinical trial available for this patient.     [place] Cooperative Oncology Group performance status is 0, Fully active, able to carry on all pre-disease performance without restriction.Marland Kitchen     Physical Exam  Constitutional:       General: She is not in acute distress.     Appearance: She is not toxic-appearing.   Eyes:      General: No scleral icterus.     Conjunctiva/sclera: Conjunctivae normal.   Cardiovascular:      Rate and Rhythm: Normal rate and regular rhythm.      Heart sounds: Normal heart sounds. No murmur heard.     No gallop.   Pulmonary:      Effort: Pulmonary effort is normal. No respiratory distress.      Breath sounds: Normal breath sounds. No wheezing or rales.   Abdominal:      General: Bowel sounds are normal. There is no distension.      Palpations: Abdomen is soft.      Tenderness: There is no abdominal tenderness.   Musculoskeletal:      Right lower leg: No edema.  Left lower leg: No edema.   Skin:     Coloration: Skin is not jaundiced.      Findings: No rash.   Neurological:      General: No focal deficit present.      Mental Status: She is alert. Mental status is at baseline.   Psychiatric:         Mood and Affect: Mood normal.         Behavior: Behavior normal.         Thought Content: Thought content normal.         Judgment: Judgment normal.          CBC w diff    Lab Results   Component Value Date/Time    WBC 7.4 08/19/2023 10:38 AM    RBC 5.02 (H) 08/19/2023 10:38 AM    HGB 13.8 08/19/2023 10:38 AM    HCT 42.7 08/19/2023 10:38 AM    MCV 85.0 08/19/2023 10:38 AM    MCH 27.5 08/19/2023 10:38 AM    MCHC 32.4 08/19/2023 10:38 AM    RDW 16.0 (H) 08/19/2023 10:38 AM    PLTCT 194 08/19/2023 10:38 AM    MPV 9.0 08/19/2023 10:38 AM    Lab Results   Component Value Date/Time    NEUT 42 08/19/2023 10:38 AM    ANC 3.20 08/19/2023 10:38 AM    LYMA 35 08/19/2023 10:38 AM    ALC 2.60 08/19/2023 10:38 AM    MONA 12 08/19/2023 10:38 AM    AMC 0.90 (H) 08/19/2023 10:38 AM    EOSA 10 (H) 08/19/2023 10:38 AM    AEC 0.70 (H) 08/19/2023 10:38 AM    BASA 1 08/19/2023 10:38 AM    ABC 0.10 08/19/2023 10:38 AM        Comprehensive Metabolic Profile    Lab Results   Component Value Date/Time    NA 142 08/19/2023 10:38 AM    K 3.8 08/19/2023 10:38 AM    CL 111 (H) 08/19/2023 10:38 AM    CO2 25 08/19/2023 10:38 AM    GAP 6 08/19/2023 10:38 AM    BUN 19 08/19/2023 10:38 AM    CR 0.92 08/19/2023 10:38 AM    GLU 88 08/19/2023 10:38 AM    Lab Results   Component Value Date/Time    CA 8.5 08/19/2023 10:38 AM    ALBUMIN 3.7 08/19/2023 10:38 AM    TOTPROT 5.8 (L) 08/19/2023 10:38 AM    ALKPHOS 60 08/19/2023 10:38 AM    AST 13 08/19/2023 10:38 AM    ALT 13 08/19/2023 10:38 AM    TOTBILI 0.6 08/19/2023 10:38 AM    GFR >60 11/09/2020 02:21 PM    GFRAA >60 11/09/2020 02:21 PM        MRI 02/11/2023    1.  Small arterially enhancing lesion in the pancreatic head corresponding   to the known tracer avid pancreatic neuroendocrine carcinoma     2.  No evidence of upper abdominal metastatic disease.     3.  Partial right lower pole nephrectomy. No discrete enhancing renal mass.     Dotatate 08/19/2023  1. 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     2. No tracer avid metastatic disease.      Assessment and Plan:      72  yr old female with Well-differentiated neuroendocrine cancer (pNET)  Has  been following with Dr. Roselle Locus, transfer the care to [place] since Dr. Roselle Locus left [place].    Surveillance CT scans were completed in 2017 which incidentally found a subcentimeter nodule on the head of the pancreas. She had an EUS 09/25/18 revealed a 7 mm round hypoechoic lesion. This was surrounded by blood vessels. FNA was completed which revealed well-differentiated neuroendocrine tumor. MRI 10/15/18 did not reveal a pancreatic lesion. She had a CT 08/23/19 that revealed a stable, small, arterial phase hyperenhancing nodule within the pancreatic head. The size was stable compared to previous imaging. An EUS was repeated 12/07/19 which revealed an 8 mm hypoechoic mass on the head of the pancreas. Due to the blood vessels around the lesion it was difficult to perform FNA. CT scan 01/25/20 did not identify a pancreatic lesion. She met with Dr.Kumer 02/03/20, whipple surgery was offered but she declined at this time.     Since that time she has been undergoing interval surveillance imaging with NET PET every 6 months. Previously followed with Dr. Roselle Locus and established care with Dr. Wynelle Link on 04/30/22.     Neuroendocrine PET 04/30/22 with no evidence of metastatic disease. She does not have any ongoing symptoms readily attributable to her underlying neuroendocrine tumor.    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  -- needs EGD and colonoscopy, mammograph (the pt agree to have done at local, since the pt livers away from here)           The patient and family had a chance to ask questions, all been answered to their statisfaction.       Miguel Rota, MD, [facility]

## 2023-08-19 NOTE — Patient Instructions
Dr. Miguel Rota and Lianne Moris, APRN-NP  Nurses: Lucillie Garfinkel, BSN, RN & Erenest Rasher, BSN, RN  Phone: [phone]   Fax: [phone]  Available Monday - Friday 8:00 am - 4:00 pm    SCHEDULING NEEDS: [phone]    On-call number for urgent needs outside of business hours: [phone]  Ask for the on-call Oncology fellow to be paged and they will call you back.    Medication refills: Please contact your pharmacy FIRST for medication refills; if they do not have any refills on file, they will contact the office. Make sure they have our correct contact information on file.  P: [phone], F: [phone]    MyChart Messages: All messages come to the nurse and we discuss with Dr. Wynelle Link and Lianne Moris, APRN. Please make sure to select Dr. Wynelle Link or Denny Peon when sending a message. Messages are answered 8:00 am - 4:00 pm Monday - Friday, holidays excluded. Please keep in mind we have 4 hours to respond to your messages.    Phone Calls: Our phone number is strictly a Camera operator. We check these messages all day and will get back with you. Please state your full name, date of birth, and reason for your call with as much detail as possible. Please keep in mind we have 4 hours to return your call.    FMLA/Insurance/Disability paperwork: Please fill out as much of the paperwork as you are able. We request that you give [place] 7-10 business days to complete this as we have a high volume of paperwork to complete for our patients.

## 2023-11-18 ENCOUNTER — Encounter: Admit: 2023-11-18 | Discharge: 2023-11-19 | Payer: MEDICARE

## 2023-11-23 ENCOUNTER — Encounter: Admit: 2023-11-23 | Discharge: 2023-11-23 | Payer: MEDICARE

## 2023-12-02 ENCOUNTER — Encounter: Admit: 2023-12-02 | Discharge: 2023-12-02 | Payer: MEDICARE

## 2023-12-25 ENCOUNTER — Encounter: Admit: 2023-12-25 | Discharge: 2023-12-25 | Payer: MEDICARE

## 2023-12-27 ENCOUNTER — Encounter: Admit: 2023-12-27 | Discharge: 2023-12-27 | Payer: MEDICARE

## 2024-01-01 ENCOUNTER — Encounter: Admit: 2024-01-01 | Discharge: 2024-01-01 | Payer: MEDICARE

## 2024-01-01 NOTE — Telephone Encounter
Received new referral via fax.  Orig created on 1/13 for GI--redirected on 1/30--Records scanned on 1/23

## 2024-01-01 NOTE — Telephone Encounter
Hepatology Referral Summary    Shelby Rogers, September 03, 1951, [id number]                     Reason for Visit/Diagnosis:  Esophageal varices    HPI Summary (PMH and MELD to be included only if OLT):      Labs:  Bookmarked  CBC and CMP/Hep Panel    Radiology/Facility:  Bookmarked  CT    Pathology/Facility:  Bookmarked  EGD/Colon    Endoscopy/Facility:  Bookmarked  EGD/Colon    Appointment Needs --   - Provider: Next Available, MD   - Urgency: Next Available   - Department: Gen Hep   - Other: None    Insurance: Payor: MEDICARE / Plan: MEDICARE PART A AND B / Product Type: Medicare /     Provider Info --  Referring:   Juan Quam, DO  [address]  [place],  [place] [address]    PCP:   No primary care provider on file.  No primary provider on file.

## 2024-01-02 ENCOUNTER — Encounter: Admit: 2024-01-02 | Discharge: 2024-01-02 | Payer: MEDICARE

## 2024-01-09 ENCOUNTER — Encounter: Admit: 2024-01-09 | Discharge: 2024-01-09 | Payer: MEDICARE

## 2024-02-03 ENCOUNTER — Encounter: Admit: 2024-02-03 | Discharge: 2024-02-03 | Payer: MEDICARE

## 2024-02-06 ENCOUNTER — Encounter: Admit: 2024-02-06 | Discharge: 2024-02-06 | Payer: MEDICARE

## 2024-02-06 NOTE — Telephone Encounter
 Shelby Rogers letting us know she had an MRI abd w/wo completed at [facility] ~3 weeks ago and would like to cancel her scan scheduled here. She is requesting a PET scan in its place. A recent EGD/c-scope showed esophageal varices and diverticulosis. I informed Tamieka I would get all records requested and shown to Dr. Wynelle Link. If he approves ordering a PET scan, I will assist in arranging this. I also offered assistance in seeing if I can get her in with hepatology sooner than May 19th. She verbalized understanding, no further questions for now.

## 2024-02-10 ENCOUNTER — Encounter: Admit: 2024-02-10 | Discharge: 2024-02-10 | Payer: MEDICARE

## 2024-02-11 ENCOUNTER — Encounter: Admit: 2024-02-11 | Discharge: 2024-02-11 | Payer: MEDICARE

## 2024-02-11 DIAGNOSIS — D3A8 Other benign neuroendocrine tumors: Secondary | ICD-10-CM

## 2024-02-11 NOTE — Telephone Encounter
 Received another message from patient stating that she needs to reschedule her appt with Dr. Wynelle Link on 02/18/24 d/t a conflict.  I called patient, patient is wanting an early afternoon appt.  Next available early afternoon appt with Dr. Wynelle Link was 04/20/24 which patient selected.  She has an appt with Hepatology the day prior and will get labs drawn then and have TH appt with Dr. Wynelle Link on 04/20/24.

## 2024-02-11 NOTE — Telephone Encounter
 Patient calling to f/u regarding her conversation with Gildardo Griffes RN on 02/06/24.  I spoke with Odette Fraction that told me that Dr. Wynelle Link said there is no need for PET scan now as imaging in January looked good and that [facility] sent a message to Hepatology to see if they could see her sooner but hasn't heard back.    I called and spoke with Gavin Pound.  Notified her of the above and she verbalized understanding.

## 2024-02-18 ENCOUNTER — Encounter: Admit: 2024-02-18 | Discharge: 2024-02-18 | Payer: MEDICARE

## 2024-04-02 ENCOUNTER — Encounter: Admit: 2024-04-02 | Discharge: 2024-04-02 | Payer: MEDICARE

## 2024-04-12 ENCOUNTER — Encounter: Admit: 2024-04-12 | Discharge: 2024-04-12 | Payer: MEDICARE

## 2024-04-13 ENCOUNTER — Encounter: Admit: 2024-04-13 | Discharge: 2024-04-13 | Payer: MEDICARE

## 2024-04-19 ENCOUNTER — Ambulatory Visit: Admit: 2024-04-19 | Discharge: 2024-04-19 | Payer: MEDICARE

## 2024-04-19 ENCOUNTER — Encounter: Admit: 2024-04-19 | Discharge: 2024-04-19 | Payer: MEDICARE

## 2024-04-20 ENCOUNTER — Encounter: Admit: 2024-04-20 | Discharge: 2024-04-20 | Payer: MEDICARE

## 2024-04-20 ENCOUNTER — Ambulatory Visit: Admit: 2024-04-20 | Discharge: 2024-04-20 | Payer: MEDICARE

## 2024-04-20 DIAGNOSIS — C7A8 Other malignant neuroendocrine tumors: Secondary | ICD-10-CM

## 2024-04-20 DIAGNOSIS — D3A8 Other benign neuroendocrine tumors: Secondary | ICD-10-CM

## 2024-04-20 NOTE — Progress Notes
 Faxed records request to [facility] [phone] for the most recent MRI report and images via cloud.

## 2024-04-20 NOTE — Progress Notes
 Name: Shelby Rogers          MRN: [id number]      DOB: 01/31/1951      AGE: 73 y.o.   DATE OF SERVICE: 04/20/2024    Subjective:             Reason for Visit:  Follow Up    Shelby Rogers is a 73 y.o. female newly diagnosed with neuroendocrine cancer.     Cancer Staging   No matching staging information was found for the patient.      History of Present Illness  Shelby Rogers is a 73 y.o. 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 She is status post right robotic partial nephrectomy with negative margins 04/25/15. Surveillance CT scans were completed in 2017 which incidentally found a subcentimeter nodule on the head of the pancreas. She had an EUS 09/25/18 revealed a 7 mm round hypoechoic lesion. This was surrounded by blood vessels. FNA was completed which revealed well-differentiated neuroendocrine tumor. MRI 10/15/18 did not reveal a pancreatic lesion. She had a CT 08/23/19 that revealed a stable, small, arterial phase hyperenhancing nodule within the pancreatic head. The size was stable compared to previous imaging. An EUS was repeated 12/07/19 which revealed an 8 mm hypoechoic mass on the head of the pancreas. Due to the blood vessels around the lesion it was difficult to perform FNA. CT scan 01/25/20 did not identify a pancreatic lesion. She met with Dr.Kumer 02/03/20, whipple surgery was offered but she declined at this time. She is here today to establish care.      Had been following with Dr. Cheryln Cory, transfer the care to us  since Dr. Cheryln Cory left [place].    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   HENT:  Negative for congestion, rhinorrhea and sinus pressure.    Eyes:  Positive for photophobia. Negative for visual disturbance.   Respiratory:  Negative for cough, shortness of breath and wheezing.    Cardiovascular:  Negative for chest pain and palpitations.   Gastrointestinal:  Positive for blood in stool, constipation and rectal pain. Negative for abdominal distention, abdominal pain, diarrhea and nausea.   Genitourinary:  Negative for dysuria, frequency and urgency.   Musculoskeletal:  Negative for arthralgias and myalgias.   Skin:  Negative for color change and rash.   Allergic/Immunologic: Positive for food allergies.   Neurological:  Negative for weakness and headaches.   Psychiatric/Behavioral:  The patient is not nervous/anxious.          Objective:          albuterol sulfate (PROAIR HFA) 90 mcg/actuation HFA aerosol inhaler Inhale two puffs by mouth into the lungs every 6 hours as needed for Wheezing or Shortness of Breath. Shake well before use.    albuterol-ipratropium (DUONEB) 0.5 mg-3 mg(2.5 mg base)/3 mL nebulizer solution Inhale 3 mL solution by nebulizer as directed four times daily as needed for  Wheezing.    famotidine (PEPCID) 20 mg tablet Take one tablet by mouth twice daily.    fluticasone (FLONASE) 50 mcg/actuation nasal spray Apply two sprays to each nostril as directed daily.    levothyroxine (SYNTHROID) 25 mcg tablet Take one tablet by mouth every morning.    montelukast (SINGULAIR) 10 mg tablet Take one tablet by mouth every morning.    ondansetron HCL (ZOFRAN) 4 mg tablet     pantoprazole DR (PROTONIX) 40 mg tablet Take one tablet by mouth twice daily. Starting 1 week prior to procedure and for 1 month post procedure.     Vitals:    04/20/24 1257   PainSc: Zero     There is no height or weight on file to calculate BMI.     Pain Score: Zero       Fatigue Scale: 0-None    Pain Addressed:  N/A    Patient Evaluated for a Clinical Trial: No treatment clinical trial available for this patient.     [place] Cooperative Oncology Group performance status is 0, Fully active, able to carry on all pre-disease performance without restriction.Shelby Rogers     Physical Exam  Constitutional:       General: Shelby Rogers is not in acute distress.     Appearance: Shelby Rogers is not toxic-appearing.   Eyes:      General: No scleral icterus.     Conjunctiva/sclera: Conjunctivae normal.   Cardiovascular:      Rate and Rhythm: Normal rate and regular rhythm.      Heart sounds: Normal heart sounds. No murmur heard.     No gallop.   Pulmonary:      Effort: Pulmonary effort is normal. No respiratory distress.      Breath sounds: Normal breath sounds. No wheezing or rales.   Abdominal:      General: Bowel sounds are normal. There is no distension.      Palpations: Abdomen is soft.      Tenderness: There is no abdominal tenderness.   Musculoskeletal:      Right lower leg: No edema.      Left lower leg: No edema.   Skin:     Coloration: Skin is not jaundiced.      Findings: No rash.   Neurological:      General: No focal deficit present.      Mental Status: Shelby Rogers is alert. Mental status is at baseline.   Psychiatric:         Mood and Affect: Mood normal.         Behavior: Behavior normal.         Thought Content: Thought content normal.         Judgment: Judgment normal.        MRI 02/11/2023    1.  Small arterially enhancing lesion in the pancreatic head corresponding   to the known tracer avid pancreatic neuroendocrine carcinoma     2.  No evidence of upper abdominal metastatic disease.     3.  Partial right lower pole nephrectomy. No discrete enhancing renal mass.     [place] 08/19/2023  1. 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     2. No tracer avid metastatic disease. 11/19/2023 CT      11/13/2023 EGD       Latest Reference Range & Units 04/19/24 12:55   White Blood Cells  4.50 - 11.00 10*3/uL 12.00 (H)   Hemoglobin 12.0 - 15.0 g/dL 78.2   Hematocrit 95.6 - 45.0 % 42.8   Platelet Count 150 - 400 10*3/uL 230   Neutrophils 41.0 - 77.0 % 63.4   Absolute Neutrophil Count 1.80 - 7.00 10*3/uL 7.60 (H)   Lymphocytes 24.0 - 44.0 % 23.7 (L)   Absolute Lymph Count 1.00 - 4.80 10*3/uL 2.80   Monocytes 4.0 - 12.0 % 9.7   Absolute Monocyte Count 0.00 - 0.80 10*3/uL 1.20 (H)   Eosinophils 0.0 - 5.0 % 2.5   Absolute Eosinophil Count 0.00 - 0.45 10*3/uL 0.30   Absolute Basophil Count 0.00 - 0.20 10*3/uL 0.10   Basophils 0.0 - 2.0 % 0.7   RBC 4.00 - 5.00 10*6/uL 4.95   MCV 80.0 - 100.0 fL 86.5   MCH 26.0 - 34.0 pg 28.7   MCHC 32.0 - 36.0 g/dL 21.3   MPV 7.0 - 08.6 fL 8.7   RDW 11.0 - 15.0 % 15.5 (H)   Protime 9.9 - 14.2 Seconds 13.2   INR 0.9 - 1.2  1.2   Iron 50 - 160 ?g/dL 67   % Saturation 28 - 42 % 14 (L)   Iron Binding-TIBC 270 - 380 mcg/dL 578 (H)   Ferritin 10 - 200 ng/mL 11   Transferrin 185 - 336 mg/dL 469   Sodium 629 - 528 mmol/L 141   Potassium 3.5 - 5.1 mmol/L 4.0   Chloride 98 - 110 mmol/L 106   CO2 21 - 30 mmol/L 25   Anion Gap 3 - 12  10   Blood Urea Nitrogen 7 - 25 mg/dL 18   Creatinine 4.13 - 1.00 mg/dL 2.44   Glomerular Filtration Rate (GFR) >60 mL/min >60   Glucose 70 - 100 mg/dL 88   Albumin 3.5 - 5.0 g/dL 4.0   Calcium 8.5 - 01.0 mg/dL 8.9   Total Bilirubin 0.2 - 1.3 mg/dL 0.6   Total Protein 6.0 - 8.0 g/dL 6.7   AST (SGOT) 7 - 40 U/L 13   ALT (SGPT) 7 - 56 U/L 12   Alk Phosphatase 25 - 110 U/L 63   Alpha Feto Protein 0.0 - 15.0 ng/mL 6.7   Tissue Transglutaminase <15.0 U/mL 0.6   IgG 762 - 1,488 mg/dL 272   IgM 38 - 536 mg/dL 29 (L)   IgA 70 - 644 mg/dL 034   Hepatitis A, Total Non-reactive  Non-reactive   Anti HBc Total Non-reactive  Non-reactive   Anti HBs Negative  Positive !   HBsAg Non-reactive  Non-reactive   HIV 1 and 2 AG AB Screen Non-reactive  Non-reactive (H): Data is abnormally high  (L): Data is abnormally low  !: Data is abnormal     Assessment and Plan:      73  yr old female with Well-differentiated neuroendocrine cancer (pNET)  Has been following with Dr. Cheryln Cory, transfer the care to us  since Dr. Cheryln Cory left [place].    Surveillance CT scans were completed in 2017 which incidentally found a subcentimeter nodule on the head of the pancreas. She had an EUS 09/25/18 revealed a 7 mm round hypoechoic lesion. This was surrounded by blood vessels. FNA was completed which revealed well-differentiated neuroendocrine tumor. MRI 10/15/18 did not reveal a pancreatic lesion. She had a CT 08/23/19 that revealed a stable, small, arterial phase hyperenhancing nodule within the pancreatic head. The size was stable compared to previous imaging. An EUS was repeated 12/07/19 which revealed an 8  mm hypoechoic mass on the head of the pancreas. Due to the blood vessels around the lesion it was difficult to perform FNA. CT scan 01/25/20 did not identify a pancreatic lesion. She met with Dr.Kumer 02/03/20, whipple surgery was offered but she declined at this time.     Since that time she has been undergoing interval surveillance imaging with NET PET every 6 months. Previously followed with Dr. Cheryln Cory and established care with Dr. Paulene Boron on 04/30/22.     Neuroendocrine PET 04/30/22 with no evidence of metastatic disease. She does not have any ongoing symptoms readily attributable to her underlying neuroendocrine tumor.    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  -- follow up with her local gastroenterologist    -- call for issues        The patient and family had a chance to ask questions, all been answered to their statisfaction.       Wolfgang Hawthorn, MD, [facility]

## 2024-04-20 NOTE — Patient Instructions
 Dr. Miguel Rota and Lianne Moris, APRN-NP  Nurses: Lucillie Garfinkel, BSN, RN & Erenest Rasher, BSN, RN  Phone: [phone]   Fax: [phone]  Available Monday - Friday 8:00 am - 4:00 pm    SCHEDULING NEEDS: [phone]    On-call number for urgent needs outside of business hours: [phone]  Ask for the on-call Oncology fellow to be paged and they will call you back.    Medication refills: Please contact your pharmacy FIRST for medication refills; if they do not have any refills on file, they will contact the office. Make sure they have our correct contact information on file.  P: [phone], F: [phone]    MyChart Messages: All messages come to the nurse and we discuss with Dr. Wynelle Link and Lianne Moris, APRN. Please make sure to select Dr. Wynelle Link or Denny Peon when sending a message. Messages are answered 8:00 am - 4:00 pm Monday - Friday, holidays excluded. Please keep in mind we have 4 hours to respond to your messages.    Phone Calls: Our phone number is strictly a Camera operator. We check these messages all day and will get back with you. Please state your full name, date of birth, and reason for your call with as much detail as possible. Please keep in mind we have 4 hours to return your call.    FMLA/Insurance/Disability paperwork: Please fill out as much of the paperwork as you are able. We request that you give [place] 7-10 business days to complete this as we have a high volume of paperwork to complete for our patients.

## 2024-04-23 ENCOUNTER — Encounter: Admit: 2024-04-23 | Discharge: 2024-04-23 | Payer: MEDICARE

## 2024-05-10 ENCOUNTER — Encounter: Admit: 2024-05-10 | Discharge: 2024-05-10 | Payer: MEDICARE

## 2024-05-25 ENCOUNTER — Encounter: Admit: 2024-05-25 | Discharge: 2024-05-25 | Payer: MEDICARE

## 2024-05-31 ENCOUNTER — Encounter: Admit: 2024-05-31 | Discharge: 2024-05-31 | Payer: MEDICARE

## 2024-05-31 ENCOUNTER — Ambulatory Visit: Admit: 2024-05-31 | Discharge: 2024-05-31 | Payer: MEDICARE

## 2024-06-01 ENCOUNTER — Encounter: Admit: 2024-06-01 | Discharge: 2024-06-01 | Payer: MEDICARE

## 2024-06-01 ENCOUNTER — Ambulatory Visit: Admit: 2024-06-01 | Discharge: 2024-06-01 | Payer: MEDICARE

## 2024-06-01 MED ORDER — PROPOFOL INJ 10 MG/ML IV VIAL
INTRAVENOUS | 0 refills | Status: DC
Start: 2024-06-01 — End: 2024-06-01

## 2024-06-01 MED ORDER — FENTANYL CITRATE (PF) 50 MCG/ML IJ SOLN
INTRAVENOUS | 0 refills | Status: DC
Start: 2024-06-01 — End: 2024-06-01

## 2024-06-01 MED ORDER — ONDANSETRON HCL (PF) 4 MG/2 ML IJ SOLN
INTRAVENOUS | 0 refills | Status: DC
Start: 2024-06-01 — End: 2024-06-01

## 2024-06-01 MED ORDER — PROPOFOL 10 MG/ML IV EMUL 20 ML (INFUSION)(AM)(OR)
INTRAVENOUS | 0 refills | Status: DC
Start: 2024-06-01 — End: 2024-06-01
  Administered 2024-06-01: 18:00:00 150 ug/kg/min via INTRAVENOUS

## 2024-06-01 MED ORDER — LIDOCAINE (PF) 20 MG/ML (2 %) IJ SOLN
INTRAVENOUS | 0 refills | Status: DC
Start: 2024-06-01 — End: 2024-06-01

## 2024-06-01 MED ADMIN — LACTATED RINGERS IV SOLP [4318]: 1000.0000 mL | INTRAVENOUS | @ 18:00:00 | Stop: 2024-06-01 | NDC 00338011704

## 2024-06-03 ENCOUNTER — Encounter: Admit: 2024-06-03 | Discharge: 2024-06-03 | Payer: MEDICARE

## 2024-06-05 ENCOUNTER — Encounter: Admit: 2024-06-05 | Discharge: 2024-06-05 | Payer: MEDICARE

## 2024-07-19 ENCOUNTER — Encounter: Admit: 2024-07-19 | Discharge: 2024-07-19 | Payer: MEDICARE

## 2024-07-21 ENCOUNTER — Encounter: Admit: 2024-07-21 | Discharge: 2024-07-21 | Payer: MEDICARE

## 2024-07-21 NOTE — Telephone Encounter
 Patient called to cancel message sent to [facility] on teams to cancel.     Shelby Rogers, patient is requesting that her records be sent to Dr. Prentice Pepper in [place]  doctor is listed in her chart.

## 2024-07-21 NOTE — Telephone Encounter
 Recent ov faxed to Dr.Wyant

## 2024-07-21 NOTE — Telephone Encounter
 Returned call. Spoke with the patient regarding the 8/20 appointment today. Let her know that message was received that the appointment was to be cancelled. Patient confirmed that she wanted the appointment cancelled and it isn't necessary to follow up. Voiced understanding.

## 2024-07-26 ENCOUNTER — Encounter: Admit: 2024-07-26 | Discharge: 2024-07-26 | Payer: MEDICARE

## 2024-07-29 ENCOUNTER — Encounter: Admit: 2024-07-29 | Discharge: 2024-07-29 | Payer: MEDICARE

## 2024-09-27 ENCOUNTER — Encounter: Admit: 2024-09-27 | Discharge: 2024-09-27 | Payer: MEDICARE

## 2024-10-08 ENCOUNTER — Encounter: Admit: 2024-10-08 | Discharge: 2024-10-08 | Payer: MEDICARE

## 2024-10-08 DIAGNOSIS — C7A8 Other malignant neuroendocrine tumors: Principal | ICD-10-CM

## 2024-10-12 ENCOUNTER — Encounter: Admit: 2024-10-12 | Discharge: 2024-10-12 | Payer: MEDICARE

## 2024-10-18 ENCOUNTER — Encounter: Admit: 2024-10-18 | Discharge: 2024-10-18 | Payer: MEDICARE

## 2024-11-24 ENCOUNTER — Encounter: Admit: 2024-11-24 | Discharge: 2024-11-24 | Payer: MEDICARE

## 2024-11-28 ENCOUNTER — Encounter: Admit: 2024-11-28 | Discharge: 2024-11-28 | Payer: MEDICARE

## 2024-11-29 NOTE — Progress Notes [1]
 Name: Shelby Rogers          MRN: [id number]      DOB: 07-21-1951      AGE: 73 y.o.   DATE OF SERVICE: 12/01/2024    Subjective:             Reason for Visit:  No chief complaint on file.    Shelby Rogers is a 73 y.o. female newly diagnosed with neuroendocrine cancer.     Cancer Staging   No matching staging information was found for the patient.      History of Present Illness  Shelby Rogers is a 73 y.o. 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 She is status post right robotic partial nephrectomy with negative margins 04/25/15. Surveillance CT scans were completed in 2017 which incidentally found a subcentimeter nodule on the head of the pancreas. She had an EUS 09/25/18 revealed a 7 mm round hypoechoic lesion. This was surrounded by blood vessels. FNA was completed which revealed well-differentiated neuroendocrine tumor. MRI 10/15/18 did not reveal a pancreatic lesion. She had a CT 08/23/19 that revealed a stable, small, arterial phase hyperenhancing nodule within the pancreatic head. The size was stable compared to previous imaging. An EUS was repeated 12/07/19 which revealed an 8 mm hypoechoic mass on the head of the pancreas. Due to the blood vessels around the lesion it was difficult to perform FNA. CT scan 01/25/20 did not identify a pancreatic lesion. She met with Dr.Kumer 02/03/20, whipple surgery was offered but she declined at this time. She is here today to establish care.      Had been following with Dr. Creston, transfer the care to us  since Dr. Creston left [place].    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   HENT:  Negative for congestion, rhinorrhea and sinus pressure.    Eyes:  Positive for photophobia. Negative for visual disturbance.   Respiratory:  Negative for cough, shortness of breath and wheezing.    Cardiovascular:  Negative for chest pain and palpitations.   Gastrointestinal:  Positive for blood in stool, constipation and rectal pain. Negative for abdominal distention, abdominal pain, diarrhea and nausea.   Genitourinary:  Negative for dysuria, frequency and urgency.   Musculoskeletal:  Negative for arthralgias and myalgias.   Skin:  Negative for color change and rash.   Allergic/Immunologic: Positive for food allergies.   Neurological:  Negative for weakness and headaches.   Psychiatric/Behavioral:  The patient is not nervous/anxious.          Objective:          albuterol sulfate (PROAIR HFA) 90 mcg/actuation HFA aerosol inhaler Inhale two puffs by mouth into the lungs every 6 hours as needed for Wheezing or Shortness of Breath. Shake well before use.    albuterol-ipratropium (DUONEB) 0.5 mg-3 mg(2.5 mg base)/3 mL nebulizer solution Inhale 3 mL solution by  nebulizer as directed four times daily as needed for Wheezing.    aspirin 81 mg chewable tablet Chew one tablet by mouth daily.    famotidine  (PEPCID ) 20 mg tablet Take one tablet by mouth twice daily.    fludrocortisone (FLORINEF) 0.1 mg tablet Take one tablet by mouth daily. Pt takes on Monday, Wednesday and Friday    fluticasone  (FLONASE ) 50 mcg/actuation nasal spray Apply two sprays to each nostril as directed daily.    fluticasone  furoate (ARNUITY ELLIPTA) 100 mcg/actuation disk inhaler Inhale one puff by mouth into the lungs daily.    levothyroxine  (SYNTHROID) 25 mcg tablet Take one tablet by mouth every morning.    montelukast  (SINGULAIR ) 10 mg tablet Take one tablet by mouth every morning.    ondansetron  HCL (ZOFRAN ) 4 mg tablet     pantoprazole  DR (PROTONIX ) 40 mg tablet Take one tablet by mouth twice daily. Starting 1 week prior to procedure and for 1 month post procedure.    umeclidinium (INCRUSE ELLIPTA) 62.5 mcg/actuation inhalation disk Inhale one puff by mouth into the lungs daily.     There were no vitals filed for this visit.    There is no height or weight on file to calculate BMI.                  Pain Addressed:  N/A    Patient Evaluated for a Clinical Trial: No treatment clinical trial available for this patient.     Eastern Cooperative Oncology Group performance status is 0, Fully active, able to carry on all pre-disease performance without restriction.SABRA     Physical Exam  Constitutional:       General: Shelby Rogers is not in acute distress.     Appearance: Shelby Rogers is not toxic-appearing.   Eyes:      General: No scleral icterus.     Conjunctiva/sclera: Conjunctivae normal.   Cardiovascular:      Rate and Rhythm: Normal rate and regular rhythm.      Heart sounds: Normal heart sounds. No murmur heard.     No gallop.   Pulmonary:      Effort: Pulmonary effort is normal. No respiratory distress.      Breath sounds: Normal breath sounds. No wheezing or rales.   Abdominal:      General: Bowel sounds are normal. There is no distension.      Palpations: Abdomen is soft.      Tenderness: There is no abdominal tenderness.   Musculoskeletal:      Right lower leg: No edema.      Left lower leg: No edema.   Skin:     Coloration: Skin is not jaundiced.      Findings: No rash.   Neurological:      General: No focal deficit present.      Mental Status: Shelby Rogers is alert. Mental status is at baseline.   Psychiatric:         Mood and Affect: Mood normal.         Behavior: Behavior normal.         Thought Content: Thought content normal.         Judgment: Judgment normal.        MRI 02/11/2023    1.  Small arterially enhancing lesion in the pancreatic head corresponding   to the known tracer avid pancreatic neuroendocrine carcinoma     2.  No evidence of upper abdominal metastatic disease.     3.  Partial right lower  pole nephrectomy. No discrete enhancing renal mass.     [facility]at[facility] 08/19/2023  1. 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     2. No tracer avid metastatic disease.     11/19/2023 CT      11/13/2023 EGD     Latest Reference Range & Units 12/01/24 13:01   White Blood Cells 4.50 - 11.00 10*3/uL 9.70   Hemoglobin 12.0 - 15.0 g/dL 86.9   Hematocrit 63.9 - 45.0 % 39.7   Platelet Count 150 - 400 10*3/uL 202   Neutrophils 41.0 - 77.0 % 65.3   Absolute Neutrophil Count 1.80 - 7.00 10*3/uL 6.40   Lymphocytes 24.0 - 44.0 % 20.1 (L)   Absolute Lymph Count 1.00 - 4.80 10*3/uL 2.00   Monocytes 4.0 - 12.0 % 11.6   Absolute Monocyte Count 0.00 - 0.80 10*3/uL 1.10 (H)   Eosinophils 0.0 - 5.0 % 2.4   Absolute Eosinophil Count 0.00 - 0.45 10*3/uL 0.20   Absolute Basophil Count 0.00 - 0.20 10*3/uL 0.10   Basophils 0.0 - 2.0 % 0.6   RBC 4.00 - 5.00 10*6/uL 4.48   MCV 80.0 - 100.0 fL 88.7   MCH 26.0 - 34.0 pg 29.0   MCHC 32.0 - 36.0 g/dL 67.2   MPV 7.0 - 88.9 fL 8.7   RDW 11.0 - 15.0 % 17.6 (H)   Sodium 137 - 147 mmol/L 141   Potassium 3.5 - 5.1 mmol/L 4.0   Chloride 98 - 110 mmol/L 107   CO2 21 - 30 mmol/L 27   Anion Gap 3 - 12  7   Blood Urea Nitrogen 7 - 25 mg/dL 19   Creatinine 9.59 - 1.00 mg/dL 9.14   Glomerular Filtration Rate (GFR) >60 mL/min >60   Glucose 70 - 100 mg/dL 96   Albumin 3.5 - 5.0 g/dL 4.0   Calcium 8.5 - 89.3 mg/dL 9.2   Total Bilirubin 0.2 - 1.3 mg/dL 0.6   Total Protein 6.0 - 8.0 g/dL 6.4   AST (SGOT) 7 - 40 U/L 12   ALT (SGPT) 7 - 56 U/L 14   Alk Phosphatase 25 - 110 U/L 54   (L): Data is abnormally low  (H): Data is abnormally high    CT 12/01/2024  CHEST:   1. No thoracic metastatic disease.      ABDOMEN AND PELVIS:   1. No significant change in size of small neuroendocrine tumor in the head   of the pancreas.     2. No abdominopelvic metastatic disease.     3. Distal colonic diverticulosis.      Assessment and Plan:      73  yr old female with Well-differentiated neuroendocrine cancer (pNET)  Has been following with Dr. Creston, transfer the care to us  since Dr. Creston left .    Surveillance CT scans were completed in 2017 which incidentally found a subcentimeter nodule on the head of the pancreas. She had an EUS 09/25/18 revealed a 7 mm round hypoechoic lesion. This was surrounded by blood vessels. FNA was completed which revealed well-differentiated neuroendocrine tumor. MRI 10/15/18 did not reveal a pancreatic lesion. She had a CT 08/23/19 that revealed a stable, small, arterial phase hyperenhancing nodule within the pancreatic head. The size was stable compared to previous imaging. An EUS was repeated 12/07/19 which revealed an 8 mm hypoechoic mass on the head of the pancreas.  Due to the blood vessels around the lesion it was difficult to perform FNA. CT scan 01/25/20 did not identify a pancreatic lesion. She met with Dr.Kumer 02/03/20, whipple surgery was offered but she declined at this time.     Since that time she has been undergoing interval surveillance imaging with NET PET every 6 months. Previously followed with Dr. Creston and established care with Dr. Austin on 04/30/22.     Neuroendocrine PET 04/30/22 with no evidence of metastatic disease. She does not have any ongoing symptoms readily attributable to her underlying neuroendocrine tumor.    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     Lab -- no major issues    Explained to the pt about the images and lab results    -- cont follow up with Dotat[facility] scan in 6 months.  -- follow up with her local gastroenterologist, and PCP  -- call for issues        The patient and family had a chance to ask questions, all been answered to their statisfaction.       Harden Austin, MD, GENI

## 2024-12-01 ENCOUNTER — Encounter: Admit: 2024-12-01 | Discharge: 2024-12-01 | Payer: MEDICARE

## 2024-12-01 VITALS — BP 155/65 | HR 66 | Temp 97.20000°F | Resp 16 | Ht 64.5 in | Wt 240.2 lb

## 2024-12-01 DIAGNOSIS — R933 Abnormal findings on diagnostic imaging of other parts of digestive tract: Secondary | ICD-10-CM

## 2024-12-01 DIAGNOSIS — D3A8 Other benign neuroendocrine tumors: Principal | ICD-10-CM
